# Patient Record
Sex: Male | Born: 1985
Health system: Western US, Academic
[De-identification: ages and names within clinical notes are randomized; demographics above are authoritative.]

## PROBLEM LIST (undated history)

## (undated) ENCOUNTER — Ambulatory Visit: Payer: Self-pay

## (undated) ENCOUNTER — Ambulatory Visit (HOSPITAL_COMMUNITY): Admission: EM | Disposition: A | Discharge status: Home / Self Care | Payer: Self-pay

## (undated) DIAGNOSIS — Z21 Asymptomatic human immunodeficiency virus [HIV] infection status: Secondary | ICD-10-CM

## (undated) DIAGNOSIS — G43909 Migraine, unspecified, not intractable, without status migrainosus: Secondary | ICD-10-CM

## (undated) DIAGNOSIS — Z79890 Hormone replacement therapy (postmenopausal): Secondary | ICD-10-CM

## (undated) DIAGNOSIS — F640 Transsexualism: Secondary | ICD-10-CM

## (undated) HISTORY — PX: RHINOPLASTY: SUR1284

## (undated) HISTORY — PX: COSMETIC SURGERY: SHX468

---
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
## 2003-10-26 ENCOUNTER — Encounter: Admission: RE | Admit: 2003-10-26 | Discharge: 2003-10-26 | Payer: Self-pay | Admitting: Family Medicine

## 2003-11-05 ENCOUNTER — Ambulatory Visit (HOSPITAL_COMMUNITY): Admission: RE | Admit: 2003-11-05 | Discharge: 2003-11-05 | Payer: Self-pay | Admitting: *Deleted

## 2003-11-05 ENCOUNTER — Ambulatory Visit (HOSPITAL_BASED_OUTPATIENT_CLINIC_OR_DEPARTMENT_OTHER): Admission: RE | Admit: 2003-11-05 | Discharge: 2003-11-05 | Payer: Self-pay | Admitting: *Deleted

## 2005-03-18 ENCOUNTER — Emergency Department (HOSPITAL_COMMUNITY): Admission: EM | Admit: 2005-03-18 | Discharge: 2005-03-18 | Payer: Self-pay | Admitting: Emergency Medicine

## 2005-11-04 ENCOUNTER — Emergency Department (HOSPITAL_COMMUNITY): Admission: EM | Admit: 2005-11-04 | Discharge: 2005-11-04 | Payer: Self-pay | Admitting: Emergency Medicine

## 2006-11-27 ENCOUNTER — Emergency Department (HOSPITAL_COMMUNITY): Admission: EM | Admit: 2006-11-27 | Discharge: 2006-11-27 | Payer: Self-pay | Admitting: Emergency Medicine

## 2006-12-02 ENCOUNTER — Emergency Department (HOSPITAL_COMMUNITY): Admission: EM | Admit: 2006-12-02 | Discharge: 2006-12-02 | Payer: Self-pay | Admitting: Family Medicine

## 2007-12-16 ENCOUNTER — Emergency Department (HOSPITAL_COMMUNITY): Admission: EM | Admit: 2007-12-16 | Discharge: 2007-12-16 | Payer: Self-pay | Admitting: Emergency Medicine

## 2008-02-20 ENCOUNTER — Emergency Department (HOSPITAL_COMMUNITY): Admission: EM | Admit: 2008-02-20 | Discharge: 2008-02-20 | Payer: Self-pay | Admitting: Emergency Medicine

## 2008-02-22 ENCOUNTER — Emergency Department (HOSPITAL_COMMUNITY): Admission: EM | Admit: 2008-02-22 | Discharge: 2008-02-22 | Payer: Self-pay | Admitting: Emergency Medicine

## 2008-02-24 ENCOUNTER — Emergency Department (HOSPITAL_COMMUNITY): Admission: EM | Admit: 2008-02-24 | Discharge: 2008-02-24 | Payer: Self-pay | Admitting: Family Medicine

## 2009-11-16 ENCOUNTER — Emergency Department (HOSPITAL_COMMUNITY): Admission: EM | Admit: 2009-11-16 | Discharge: 2009-11-16 | Payer: Self-pay | Admitting: Emergency Medicine

## 2009-11-18 ENCOUNTER — Emergency Department (HOSPITAL_COMMUNITY): Admission: EM | Admit: 2009-11-18 | Discharge: 2009-11-18 | Payer: Self-pay | Admitting: Emergency Medicine

## 2009-11-22 ENCOUNTER — Emergency Department (HOSPITAL_COMMUNITY): Admission: EM | Admit: 2009-11-22 | Discharge: 2009-11-22 | Payer: Self-pay | Admitting: Emergency Medicine

## 2010-03-26 ENCOUNTER — Emergency Department (HOSPITAL_COMMUNITY)
Admission: EM | Admit: 2010-03-26 | Discharge: 2010-03-26 | Payer: Self-pay | Source: Home / Self Care | Admitting: Emergency Medicine

## 2010-04-06 ENCOUNTER — Emergency Department (HOSPITAL_COMMUNITY)
Admission: EM | Admit: 2010-04-06 | Discharge: 2010-04-06 | Payer: Self-pay | Source: Home / Self Care | Admitting: Emergency Medicine

## 2010-04-25 ENCOUNTER — Emergency Department (HOSPITAL_COMMUNITY)
Admission: EM | Admit: 2010-04-25 | Discharge: 2010-04-25 | Disposition: A | Discharge status: Home / Self Care | Payer: Self-pay | Attending: Emergency Medicine | Admitting: Emergency Medicine

## 2010-04-25 DIAGNOSIS — R21 Rash and other nonspecific skin eruption: Secondary | ICD-10-CM | POA: Insufficient documentation

## 2010-04-25 DIAGNOSIS — B86 Scabies: Secondary | ICD-10-CM | POA: Insufficient documentation

## 2010-05-29 LAB — URINALYSIS, ROUTINE W REFLEX MICROSCOPIC
Glucose, UA: NEGATIVE mg/dL
Ketones, ur: NEGATIVE mg/dL
Nitrite: NEGATIVE
Protein, ur: NEGATIVE mg/dL
Urobilinogen, UA: 0.2 mg/dL (ref 0.0–1.0)

## 2010-05-29 LAB — URINE MICROSCOPIC-ADD ON

## 2010-08-01 NOTE — Op Note (Signed)
NAME:  Frederick Hess, Frederick Hess                      ACCOUNT NO.:  [id number]   MEDICAL RECORD NO.:  [id number]                   PATIENT TYPE:  AMB   LOCATION:  DSC                                  FACILITY:  [facility]   PHYSICIAN:  Lowell Bouton, M.D.      DATE OF BIRTH:  02/21/1986   DATE OF PROCEDURE:  11/05/2003  DATE OF DISCHARGE:                                 OPERATIVE REPORT   PREOPERATIVE DIAGNOSIS:  Infected wound, right wrist and thumb.   POSTOPERATIVE DIAGNOSIS:  Partial radial artery laceration with large  hematoma, right wrist and thumb.   OPERATION/PROCEDURE:  Irrigation and debridement, right wrist and thumb with  attempted repair of longitudinal tear of dorsal branch of the radial artery.   SURGEON:  Lowell Bouton, M.D.   ANESTHESIA:  General.   OPERATIVE FINDINGS:  The patient had a large hematoma in the wound without  any gross purulence.  The dorsal branch of the radial artery had been  longitudinally cut.  The tissue was so friable as the injury was two weeks  old that on attempted repair of the artery, the sutures tore through the  vessel wall.  The artery was ultimately tied off.   DESCRIPTION OF PROCEDURE:  Under general anesthesia with a tourniquet on the  right arm, the right hand was prepped and draped in the usual fashion.  After elevating the limb, the tourniquet was inflated to 250 mmHg.  The old  sutures were removed and the large hematoma was evacuated.  There was  significant bleeding from the base of the wound.  It was noted that the  dorsal branch of the radial artery was longitudinally cut.  At this point  the microscope was brought in and with micro instruments, an 8-0 nylon was  used to try to repair the cut in the artery.  The tissue was very friable  and the suture material would pull out of the vessel.  After attempting to  repair completely with the 8-0 nylon suture, the tourniquet was released and  there was significant  bleeding.  It was noted that the sutures could not  hold the vessel together with the amount of force of the blood.  The  tourniquet was then reinflated and the artery was dissected out under loop  magnification.  It was then tied off proximally and distally with a 4-0  chromic suture.  The tourniquet was again released and the bleeding was  controlled.  There was good circulation of the hand.  The wound was  irrigated copiously with saline. Cultures were obtained and sent to the lab.  A vessel loop drain was left in for drainage and the central part of the  wound was packed open.  The skin edges were debrided of the rough edges and  were closed with 4-0 nylon sutures.  Sterile dressings were applied followed  by  thumb spica splint.  The patient tolerated the procedure well and went to  the recovery room awake and in stable and good condition.   He will be observed overnight for any further bleeding or increased pain.                                               Lowell Bouton, M.D.    EMM/MEDQ  D:  11/05/2003  T:  11/05/2003  Job:  [id number]   cc:   Bryan Lemma. Manus Gunning, M.D.  [address] [place]  [place] [address]  Fax: [phone]

## 2010-08-29 ENCOUNTER — Emergency Department (HOSPITAL_COMMUNITY)
Admission: EM | Admit: 2010-08-29 | Discharge: 2010-08-29 | Disposition: A | Discharge status: Home / Self Care | Payer: Self-pay | Attending: Emergency Medicine | Admitting: Emergency Medicine

## 2010-08-29 DIAGNOSIS — Z202 Contact with and (suspected) exposure to infections with a predominantly sexual mode of transmission: Secondary | ICD-10-CM | POA: Insufficient documentation

## 2010-08-29 DIAGNOSIS — L290 Pruritus ani: Secondary | ICD-10-CM | POA: Insufficient documentation

## 2010-12-19 LAB — COMPREHENSIVE METABOLIC PANEL
ALT: 20 U/L (ref 0–53)
Alkaline Phosphatase: 43 U/L (ref 39–117)
BUN: 4 mg/dL — ABNORMAL LOW (ref 6–23)
CO2: 26 mEq/L (ref 19–32)
Calcium: 8.9 mg/dL (ref 8.4–10.5)
GFR calc non Af Amer: >60 mL/min (ref >60)
Glucose, Bld: 124 mg/dL — ABNORMAL HIGH (ref 70–99)
Sodium: 138 mEq/L (ref 135–145)

## 2010-12-19 LAB — CBC
HCT: 42.3 % (ref 39.0–52.0)
Hemoglobin: 14.2 g/dL (ref 13.0–17.0)
MCHC: 33.6 g/dL (ref 30.0–36.0)
MCV: 91.4 fL (ref 78.0–100.0)
RBC: 4.63 MIL/uL (ref 4.22–5.81)
RDW: 13.8 % (ref 11.5–15.5)

## 2010-12-19 LAB — DIFFERENTIAL
Basophils Relative: 0 % (ref 0–1)
Eosinophils Absolute: 0.1 10*3/uL (ref 0.0–0.7)
Lymphs Abs: 1 10*3/uL (ref 0.7–4.0)
Neutro Abs: 6.7 10*3/uL (ref 1.7–7.7)
Neutrophils Relative %: 79 % — ABNORMAL HIGH (ref 43–77)

## 2010-12-19 LAB — URINALYSIS, ROUTINE W REFLEX MICROSCOPIC
Bilirubin Urine: NEGATIVE
Glucose, UA: NEGATIVE mg/dL
Hgb urine dipstick: NEGATIVE
Ketones, ur: NEGATIVE mg/dL
Nitrite: NEGATIVE
Specific Gravity, Urine: 1.018 (ref 1.005–1.030)
pH: 5.5 (ref 5.0–8.0)

## 2010-12-19 LAB — LIPASE, BLOOD: Lipase: 12 U/L (ref 11–59)

## 2011-09-20 ENCOUNTER — Emergency Department (HOSPITAL_COMMUNITY)
Admission: EM | Admit: 2011-09-20 | Discharge: 2011-09-20 | Disposition: A | Discharge status: Home / Self Care | Payer: Self-pay | Attending: Emergency Medicine | Admitting: Emergency Medicine

## 2011-09-20 ENCOUNTER — Encounter (HOSPITAL_COMMUNITY): Payer: Self-pay | Admitting: Nurse Practitioner

## 2011-09-20 ENCOUNTER — Emergency Department (HOSPITAL_COMMUNITY): Payer: Self-pay

## 2011-09-20 DIAGNOSIS — N451 Epididymitis: Secondary | ICD-10-CM

## 2011-09-20 DIAGNOSIS — N453 Epididymo-orchitis: Secondary | ICD-10-CM | POA: Insufficient documentation

## 2011-09-20 LAB — URINALYSIS, ROUTINE W REFLEX MICROSCOPIC
Ketones, ur: NEGATIVE mg/dL
Leukocytes, UA: NEGATIVE
Nitrite: NEGATIVE
Protein, ur: NEGATIVE mg/dL

## 2011-09-20 LAB — POCT I-STAT, CHEM 8
Calcium, Ion: 1.2 mmol/L (ref 1.12–1.23)
Creatinine, Ser: 1.1 mg/dL (ref 0.50–1.35)
Glucose, Bld: 77 mg/dL (ref 70–99)
Hemoglobin: 11.6 g/dL — ABNORMAL LOW (ref 13.0–17.0)
TCO2: 23 mmol/L (ref 0–100)

## 2011-09-20 MED ORDER — CIPROFLOXACIN HCL 500 MG PO TABS: 500.0000 mg | ORAL_TABLET | Freq: Two times a day (BID) | ORAL | Status: AC

## 2011-09-20 MED ORDER — OXYCODONE-ACETAMINOPHEN 5-325 MG PO TABS: 1.0000 | ORAL_TABLET | Freq: Four times a day (QID) | ORAL | Status: AC | PRN

## 2011-09-20 NOTE — ED Notes (Signed)
Pt reports she is taking hormone replacement and is concerned this could affect her kidneys.

## 2011-09-20 NOTE — ED Notes (Signed)
Patient transported to Ultrasound by [facility] EMT 

## 2011-09-20 NOTE — ED Notes (Signed)
Patient is resting comfortably. Pt returned from [place] w/no complaints

## 2011-09-20 NOTE — ED Provider Notes (Signed)
History     CSN: [id number]  Arrival date & time 09/20/11  1101   First MD Initiated Contact with Patient 09/20/11 1124      Chief Complaint  Patient presents with  . Pelvic Pain    (Consider location/radiation/quality/duration/timing/severity/associated sxs/prior treatment) HPI Comments: Patient is a "trans-sexual".  Presents complaining of pain in his right testicle since having intercourse 3 days ago and his penis "bent".  He feels as though his right testicle is swollen.  No difficulty with urination.    Patient is a 26 y.o. male presenting with pelvic pain. The history is provided by the patient.  Pelvic Pain This is a new problem. Episode onset: 3 days ago. The problem occurs constantly. The problem has not changed since onset.Pertinent negatives include no abdominal pain. Exacerbated by: erections. Nothing relieves the symptoms. He has tried a cold compress (nsaids) for the symptoms. The treatment provided no relief.    History reviewed. No pertinent past medical history.  History reviewed. No pertinent past surgical history.  History reviewed. No pertinent family history.  History  Substance Use Topics  . Smoking status: Never Smoker   . Smokeless tobacco: Not on file  . Alcohol Use: No      Review of Systems  Gastrointestinal: Negative for abdominal pain.  Genitourinary: Positive for pelvic pain.  All other systems reviewed and are negative.    Allergies  Amoxicillin  Home Medications   Current Outpatient Rx  Name Route Sig Dispense Refill  . ESTROGENS CONJUGATED 1.25 MG PO TABS Oral Take 1.25 mg by mouth daily.    Marland Kitchen MEDROXYPROGESTERONE ACETATE 10 MG PO TABS Oral Take 10 mg by mouth daily.    Marland Kitchen SPIRONOLACTONE 100 MG PO TABS Oral Take 100 mg by mouth daily.      BP 122/71  Pulse 68  Temp 98.2 F (36.8 C) (Oral)  Resp 20  Ht 6\' 1"  (1.854 m)  Wt 185 lb (83.915 kg)  BMI 24.41 kg/m2  SpO2 100%  Physical Exam  Nursing note and vitals  reviewed. Constitutional: He is oriented to person, place, and time. He appears well-developed and well-nourished. No distress.  HENT:  Head: Normocephalic and atraumatic.  Neck: Normal range of motion. Neck supple.  Genitourinary:       The GU exam was witnessed by Shon Hale RN.    There is ttp of the right scrotum and testicle with mild swelling.  The penis appears normal without swelling or deformity.    Neurological: He is alert and oriented to person, place, and time.  Skin: Skin is warm and dry. He is not diaphoretic.    ED Course  Procedures (including critical care time)   Labs Reviewed  URINALYSIS, ROUTINE W REFLEX MICROSCOPIC  GC/CHLAMYDIA PROBE AMP, URINE   No results found.   No diagnosis found.    MDM  The US of the testes shows epididymitis and a hematoma vs abscess in the perineum.  With the history given, I suspect this is a hematoma.  Will treat with antibx for the epididymitis, to return if worsens.        Geoffery Lyons, MD 09/20/11 [phone]

## 2011-09-20 NOTE — ED Notes (Signed)
Pt c/o pain in/around testicles since having intercourse 3 days ago. Tried ice and ibuprofen at home with no relief. Denies urinary changes.

## 2011-09-20 NOTE — ED Notes (Signed)
MD at bedside. Dr. Judd Lien at bedside, Marylene Land RN to chaperone

## 2011-09-20 NOTE — ED Notes (Signed)
Family at bedside. Pt still in [place], family at bedside waiting for pt to return

## 2011-09-20 NOTE — ED Notes (Signed)
Pt reports he is a transgender and prefers to be addressed as Mrs. Kinoshita. Pt reports she was having sexual intercourse 3 days ago, pt states "I missed and hit partners buttocks, penis went soft immediately." Pt reports increased lower abd pain, scrotum pain, and (R) testicle pain since injury. Pt denies any pain or burning w/urination, but reports "horrible excruciating" pain w/an erection. Pt denies any sexual intercourse since injury, pt has been applying ice to area and taking Ibuprofen w/no relief.

## 2012-01-05 ENCOUNTER — Encounter (HOSPITAL_COMMUNITY): Payer: Self-pay | Admitting: *Deleted

## 2012-01-05 ENCOUNTER — Emergency Department (HOSPITAL_COMMUNITY)
Admission: EM | Admit: 2012-01-05 | Discharge: 2012-01-05 | Disposition: A | Discharge status: Home / Self Care | Payer: Self-pay | Attending: Emergency Medicine | Admitting: Emergency Medicine

## 2012-01-05 DIAGNOSIS — R300 Dysuria: Secondary | ICD-10-CM | POA: Insufficient documentation

## 2012-01-05 DIAGNOSIS — Z79899 Other long term (current) drug therapy: Secondary | ICD-10-CM | POA: Insufficient documentation

## 2012-01-05 DIAGNOSIS — R319 Hematuria, unspecified: Secondary | ICD-10-CM | POA: Insufficient documentation

## 2012-01-05 DIAGNOSIS — R21 Rash and other nonspecific skin eruption: Secondary | ICD-10-CM | POA: Insufficient documentation

## 2012-01-05 LAB — CBC
HCT: 35.6 % — ABNORMAL LOW (ref 39.0–52.0)
MCV: 85.2 fL (ref 78.0–100.0)
RBC: 4.18 MIL/uL — ABNORMAL LOW (ref 4.22–5.81)
WBC: 7.4 10*3/uL (ref 4.0–10.5)

## 2012-01-05 LAB — URINALYSIS, ROUTINE W REFLEX MICROSCOPIC
Bilirubin Urine: NEGATIVE
Glucose, UA: NEGATIVE mg/dL
Hgb urine dipstick: NEGATIVE
Ketones, ur: NEGATIVE mg/dL
pH: 8 (ref 5.0–8.0)

## 2012-01-05 MED ORDER — HYDROCORTISONE 1 % EX CREA: TOPICAL_CREAM | CUTANEOUS | Status: DC

## 2012-01-05 MED ORDER — DIPHENHYDRAMINE HCL 25 MG PO TABS: 25.0000 mg | ORAL_TABLET | Freq: Four times a day (QID) | ORAL | Status: DC | PRN

## 2012-01-05 MED ORDER — DIPHENHYDRAMINE-ZINC ACETATE 1-0.1 % EX CREA: TOPICAL_CREAM | Freq: Three times a day (TID) | CUTANEOUS | Status: DC | PRN

## 2012-01-05 NOTE — ED Provider Notes (Signed)
History  Scribed for [employer]. [place] Zou, MD, the patient was seen in room TR04C/TR04C. This chart was scribed by Candelaria Stagers. The patient's care started at 3:29 PM   CSN: [id number]  Arrival date & time 01/05/12  1447   First MD Initiated Contact with Patient 01/05/12 1526      Chief Complaint  Patient presents with  . Hematuria     The history is provided by the patient. No language interpreter was used.   Frederick Hess is a 26 y.o. male who presents to the Emergency Department complaining of hematuria that started yesterday.  Pt is also experiencing dysuria.  Nothing seems to make the sx better or worse. History reviewed. No pertinent past medical history.  History reviewed. No pertinent past surgical history.  No family history on file.  History  Substance Use Topics  . Smoking status: Never Smoker   . Smokeless tobacco: Not on file  . Alcohol Use: No      Review of Systems  Genitourinary: Positive for hematuria.  All other systems reviewed and are negative.    Allergies  Amoxicillin  Home Medications   Current Outpatient Rx  Name Route Sig Dispense Refill  . ESTRADIOL VALERATE 40 MG/ML IM OIL Intramuscular Inject 40 mg into the muscle every 14 (fourteen) days.     . ESTROGENS CONJUGATED 1.25 MG PO TABS Oral Take 1.25 mg by mouth daily.    Marland Kitchen SPIRONOLACTONE 100 MG PO TABS Oral Take 100 mg by mouth daily.      BP 138/87  Pulse 73  Temp 98.3 F (36.8 C) (Oral)  Resp 20  SpO2 99%  Physical Exam  Nursing note and vitals reviewed. Constitutional: He is oriented to person, place, and time. He appears well-developed and well-nourished. No distress.  HENT:  Head: Normocephalic and atraumatic.  Eyes: EOM are normal. Pupils are equal, round, and reactive to light.  Neck: Neck supple. No tracheal deviation present.  Pulmonary/Chest: Effort normal. No respiratory distress.  Musculoskeletal: Normal range of motion. He exhibits no edema.    Neurological: He is alert and oriented to person, place, and time. No sensory deficit.  Skin: Skin is warm and dry.  Psychiatric: He has a normal mood and affect. His behavior is normal.    ED Course  Procedures   DIAGNOSTIC STUDIES:  COORDINATION OF CARE:  15:20 Ordered: Urinalysis, Routine w reflex microscopic   Labs Reviewed  URINALYSIS, ROUTINE W REFLEX MICROSCOPIC - Abnormal; Notable for the following:    APPearance HAZY (*)     Leukocytes, UA TRACE (*)     All other components within normal limits  URINE MICROSCOPIC-ADD ON - Abnormal; Notable for the following:    Squamous Epithelial / LPF FEW (*)     All other components within normal limits  CBC - Abnormal; Notable for the following:    RBC 4.18 (*)     Hemoglobin 12.8 (*)     HCT 35.6 (*)     Platelets 140 (*)     All other components within normal limits  RAPID HIV SCREEN ([facility])  GC/CHLAMYDIA PROBE AMP, GENITAL  RPR   No results found.   1. Dysuria   2. Hematuria       MDM  Patient was seen earlier today for rash. She states she that she got home and had blood in her urine. She's had some dysuria since yesterday. She is a transgender male to male. Urinalysis does not show hematuria or infection. Hemoglobin  is good and platelets are 140. GC chlamydia swab was done and patient will followup with urology as needed.  I personally performed the services described in this documentation, which was scribed in my presence. The recorded information has been reviewed and considered.         Juliet Rude. Rubin Payor, MD 01/05/12 2014

## 2012-01-05 NOTE — ED Provider Notes (Signed)
History  Scribed for [employer]. Sheridan Gettel, MD, the patient was seen in room TR05C/TR05C. This chart was scribed by Candelaria Stagers. The patient's care started at 11:46 AM   CSN: [id number]  Arrival date & time 01/05/12  1106   First MD Initiated Contact with Patient 01/05/12 1143      Chief Complaint  Patient presents with  . Rash    The history is provided by the patient. No language interpreter was used.   Frederick Hess is a 26 y.o. male who presents to the Emergency Department complaining of an itching rash to the abdomen and left arm that started several days ago.  Pt reports that the rash started on the abdomen and is now on the left forearm.  She reports that her roommate has the same rash.  She denies any new pets, new detergents, or new soaps.  Nothing seems to make the sx better or worse.    History reviewed. No pertinent past medical history.  History reviewed. No pertinent past surgical history.  No family history on file.  History  Substance Use Topics  . Smoking status: Never Smoker   . Smokeless tobacco: Not on file  . Alcohol Use: No      Review of Systems  Skin: Positive for rash (itching rash to left forearm).  All other systems reviewed and are negative.    Allergies  Amoxicillin  Home Medications   Current Outpatient Rx  Name Route Sig Dispense Refill  . DIPHENHYDRAMINE HCL 25 MG PO TABS Oral Take 1 tablet (25 mg total) by mouth every 6 (six) hours as needed for itching. 20 tablet 0  . DIPHENHYDRAMINE-ZINC ACETATE 1-0.1 % EX CREA Topical Apply topically 3 (three) times daily as needed for itching. 28.3 g 0  . ESTROGENS CONJUGATED 1.25 MG PO TABS Oral Take 1.25 mg by mouth daily.    Marland Kitchen HYDROCORTISONE 1 % EX CREA  Apply to affected area 2 times daily 15 g 0  . MEDROXYPROGESTERONE ACETATE 10 MG PO TABS Oral Take 10 mg by mouth daily.    Marland Kitchen SPIRONOLACTONE 100 MG PO TABS Oral Take 100 mg by mouth daily.      BP 128/78  Pulse 56  Temp 98.1 F  (36.7 C) (Oral)  Resp 16  SpO2 100%  Physical Exam  Nursing note and vitals reviewed. Constitutional: He is oriented to person, place, and time. He appears well-developed and well-nourished. No distress.  HENT:  Head: Normocephalic and atraumatic.  Eyes: EOM are normal. Pupils are equal, round, and reactive to light.  Neck: Neck supple. No tracheal deviation present.  Pulmonary/Chest: Effort normal. No respiratory distress.  Musculoskeletal: Normal range of motion. He exhibits no edema.  Neurological: He is alert and oriented to person, place, and time. No sensory deficit.  Skin: Skin is warm and dry.       2-3 small slightly raised, slightly erythemas papules.  No fluctuants, no pustules, no induration.  No visible area under the right breast as stated by the pt.    Psychiatric: He has a normal mood and affect. His behavior is normal.    ED Course  Procedures   11:48 AM  Discussed possible bugs bites.  Will prescribe benadryl.   Labs Reviewed - No data to display No results found.   1. Rash       MDM  Patient with small rash to forearm. Maybe insect bite. Does not appear to be scabies. D/c with symptomatic treatment  I  personally performed the services described in this documentation, which was scribed in my presence. The recorded information has been reviewed and considered.         Juliet Rude. Rubin Payor, MD 01/05/12 1200

## 2012-01-05 NOTE — ED Notes (Signed)
Pt is here with pain with urination and blood started coming out with urine today

## 2012-01-05 NOTE — Discharge Instructions (Signed)
Dysuria Dysuria is the medical term for pain with urination. There are many causes for dysuria, but urinary tract infection is the most common. If a urinalysis was performed it can show that there is a urinary tract infection. A urine culture confirms that you or your child is sick. You will need to follow up with a healthcare provider because:  If a urine culture was done you will need to know the culture results and treatment recommendations.  If the urine culture was positive, you or your child will need to be put on antibiotics or know if the antibiotics prescribed are the right antibiotics for your urinary tract infection.  If the urine culture is negative (no urinary tract infection), then other causes may need to be explored or antibiotics need to be stopped. Today laboratory work may have been done and there does not seem to be an infection. If cultures were done they will take at least 24 to 48 hours to be completed. Today x-rays may have been taken and they read as normal. No cause can be found for the problems. The x-rays may be re-read by a radiologist and you will be contacted if additional findings are made. You or your child may have been put on medications to help with this problem until you can see your primary caregiver. If the problems get better, see your primary caregiver if the problems return. If you were given antibiotics (medications which kill germs), take all of the mediations as directed for the full course of treatment.  If laboratory work was done, you need to find the results. Leave a telephone number where you can be reached. If this is not possible, make sure you find out how you are to get test results. HOME CARE INSTRUCTIONS   Drink lots of fluids. For adults, drink eight, 8 ounce glasses of clear juice or water a day. For children, replace fluids as suggested by your caregiver.  Empty the bladder often. Avoid holding urine for long periods of time.  After a bowel  movement, women should cleanse front to back, using each tissue only once.  Empty your bladder before and after sexual intercourse.  Take all the medicine given to you until it is gone. You may feel better in a few days, but TAKE ALL MEDICINE.  Avoid caffeine, tea, alcohol and carbonated beverages, because they tend to irritate the bladder.  In men, alcohol may irritate the prostate.  Only take over-the-counter or prescription medicines for pain, discomfort, or fever as directed by your caregiver.  If your caregiver has given you a follow-up appointment, it is very important to keep that appointment. Not keeping the appointment could result in a chronic or permanent injury, pain, and disability. If there is any problem keeping the appointment, you must call back to this facility for assistance. SEEK IMMEDIATE MEDICAL CARE IF:   Back pain develops.  A fever develops.  There is nausea (feeling sick to your stomach) or vomiting (throwing up).  Problems are no better with medications or are getting worse. MAKE SURE YOU:   Understand these instructions.  Will watch your condition.  Will get help right away if you are not doing well or get worse. Document Released: 11/29/2003 Document Revised: 05/25/2011 Document Reviewed: 10/06/2007 [facility] Patient Information 2013 [place], [place]. Hematuria, Adult Hematuria (blood in your urine) can be caused by a bladder infection (cystitis), kidney infection (pyelonephritis), prostate infection (prostatitis), or kidney stone. Infections will usually respond to antibiotics (medications which kill  germs), and a kidney stone will usually pass through your urine without further treatment. If you were put on antibiotics, take all the medicine until gone. You may feel better in a few days, but take all of your medicine or the infection may not respond and become more difficult to treat. If antibiotics were not given, an infection did not cause the blood in  the urine. A further work up to find out the reason may be needed. HOME CARE INSTRUCTIONS   Drink lots of fluid, 3 to 4 quarts a day. If you have been diagnosed with an infection, cranberry juice is especially recommended, in addition to large amounts of water.  Avoid caffeine, tea, and carbonated beverages, because they tend to irritate the bladder.  Avoid alcohol as it may irritate the prostate.  Only take over-the-counter or prescription medicines for pain, discomfort, or fever as directed by your caregiver.  If you have been diagnosed with a kidney stone follow your caregivers instructions regarding straining your urine to catch the stone. TO PREVENT FURTHER INFECTIONS:  Empty the bladder often. Avoid holding urine for long periods of time.  After a bowel movement, women should cleanse front to back. Use each tissue only once.  Empty the bladder before and after sexual intercourse if you are a male.  Return to your caregiver if you develop back pain, fever, nausea (feeling sick to your stomach), vomiting, or your symptoms (problems) are not better in 3 days. Return sooner if you are getting worse. If you have been requested to return for further testing make sure to keep your appointments. If an infection is not the cause of blood in your urine, X-rays may be required. Your caregiver will discuss this with you. SEEK IMMEDIATE MEDICAL CARE IF:   You have a persistent fever over 102 F (38.9 C).  You develop severe vomiting and are unable to keep the medication down.  You develop severe back or abdominal pain despite taking your medications.  You begin passing a large amount of blood or clots in your urine.  You feel extremely weak or faint, or pass out. MAKE SURE YOU:   Understand these instructions.  Will watch your condition.  Will get help right away if you are not doing well or get worse. Document Released: 03/02/2005 Document Revised: 05/25/2011 Document Reviewed:  10/20/2007 [facility] Patient Information 2013 [place], [place].

## 2012-01-05 NOTE — ED Notes (Signed)
Pt report received from Sharyon Cable, Charity fundraiser. Introduced self to pt. Pt states blood in urine and burning on urination.

## 2012-01-05 NOTE — ED Notes (Signed)
Pt is here with rash to left upper arm and abdomen that itches. NAD

## 2012-01-05 NOTE — ED Notes (Signed)
Pt ambulatory leaving ED with friend. Pt does not appear to be in any acute distress. Pt left with discharge instructions. Pt had no further questions upon discharge.

## 2012-01-06 ENCOUNTER — Emergency Department (HOSPITAL_COMMUNITY)
Admission: EM | Admit: 2012-01-06 | Discharge: 2012-01-06 | Disposition: A | Discharge status: Home / Self Care | Payer: Self-pay | Attending: Emergency Medicine | Admitting: Emergency Medicine

## 2012-01-06 ENCOUNTER — Encounter (HOSPITAL_COMMUNITY): Payer: Self-pay | Admitting: Emergency Medicine

## 2012-01-06 DIAGNOSIS — R339 Retention of urine, unspecified: Secondary | ICD-10-CM | POA: Insufficient documentation

## 2012-01-06 DIAGNOSIS — Z79899 Other long term (current) drug therapy: Secondary | ICD-10-CM | POA: Insufficient documentation

## 2012-01-06 DIAGNOSIS — R300 Dysuria: Secondary | ICD-10-CM | POA: Insufficient documentation

## 2012-01-06 DIAGNOSIS — R110 Nausea: Secondary | ICD-10-CM | POA: Insufficient documentation

## 2012-01-06 LAB — URINALYSIS, ROUTINE W REFLEX MICROSCOPIC
Glucose, UA: NEGATIVE mg/dL
Nitrite: NEGATIVE
Protein, ur: NEGATIVE mg/dL
pH: 6 (ref 5.0–8.0)

## 2012-01-06 LAB — URINE MICROSCOPIC-ADD ON

## 2012-01-06 MED ORDER — PROMETHAZINE HCL 25 MG PO TABS: 25.0000 mg | ORAL_TABLET | Freq: Four times a day (QID) | ORAL | Status: DC | PRN

## 2012-01-06 NOTE — ED Notes (Signed)
Pt ambulatory leaving ED with friend. Pt given discharge teaching and prescription. Pt does not appear to be in any acute distress upon discharge. Pt had no further questions at discharge.

## 2012-01-06 NOTE — ED Notes (Signed)
C. Schinlever, PA at bedside.  

## 2012-01-06 NOTE — ED Notes (Signed)
Pt returned to ED since being d/c last night. Pt states called the doctor to get results of HIV and STD screening and everything came back negative. Pt states told doctor current symptoms and said doctor told pt to return to ED to be checked. Pt state painful urination; blood in urine this morning, but has since stopped seeing blood in urine; tightness in abdomen.

## 2012-01-06 NOTE — ED Provider Notes (Signed)
History     CSN: [id number]  Arrival date & time 01/06/12  1931   First MD Initiated Contact with Patient 01/06/12 2003      Chief Complaint  Patient presents with  . Dysuria    (Consider location/radiation/quality/duration/timing/severity/associated sxs/prior treatment) HPI History provided by pt and prior chart.   Pt is trans-gender, M to F.  Presents w/ c/o dysuria, hematuria, urinary retention and lower abdominal pressure.  Presented w/ same yesterday, U/A and GC/Chlam neg, hgb and platelets w/in nml range, and pt d/c'd home w/ referral to urology.  Hematuria and dysuria have resolved but retention and pressure started this morning.  Feels like he is not completely emptying his bladder.  Associated w/ nausea.  Denies fever, genitalia rash, urethral discharge, rectal pain.   History reviewed. No pertinent past medical history.  History reviewed. No pertinent past surgical history.  No family history on file.  History  Substance Use Topics  . Smoking status: Never Smoker   . Smokeless tobacco: Not on file  . Alcohol Use: No      Review of Systems  All other systems reviewed and are negative.    Allergies  Amoxicillin  Home Medications   Current Outpatient Rx  Name Route Sig Dispense Refill  . ESTRADIOL VALERATE 40 MG/ML IM OIL Intramuscular Inject 40 mg into the muscle every 14 (fourteen) days.     . ESTROGENS CONJUGATED 1.25 MG PO TABS Oral Take 1.25 mg by mouth daily.    Marland Kitchen SPIRONOLACTONE 100 MG PO TABS Oral Take 100 mg by mouth daily.      BP 130/79  Pulse 62  Temp 98.4 F (36.9 C) (Oral)  Resp 18  SpO2 100%  Physical Exam  Nursing note and vitals reviewed. Constitutional: He is oriented to person, place, and time. He appears well-developed and well-nourished. No distress.  HENT:  Head: Normocephalic and atraumatic.  Eyes:       Normal appearance  Neck: Normal range of motion.  Cardiovascular: Normal rate and regular rhythm.   Pulmonary/Chest:  Effort normal and breath sounds normal. No respiratory distress.  Abdominal: Soft. Bowel sounds are normal. He exhibits no distension and no mass. There is no rebound and no guarding.       Mild suprapubic ttp  Genitourinary:       No CVA tenderness.  No genitalia rash.  No penile discharge.  Testicles descended bilaterally.  No masses.  No tenderness.  Prostate is not enlarged nor ttp.  Musculoskeletal: Normal range of motion.  Neurological: He is alert and oriented to person, place, and time.  Skin: Skin is warm and dry. No rash noted.  Psychiatric: He has a normal mood and affect. His behavior is normal.    ED Course  Procedures (including critical care time)  Labs Reviewed  URINALYSIS, ROUTINE W REFLEX MICROSCOPIC - Abnormal; Notable for the following:    APPearance HAZY (*)     Leukocytes, UA TRACE (*)     All other components within normal limits  URINE MICROSCOPIC-ADD ON - Abnormal; Notable for the following:    Squamous Epithelial / LPF FEW (*)     All other components within normal limits   No results found.   1. Dysuria       MDM  26yo Transgender M to F presents w/ urinary retention and lower abdominal pressure.  Seen for hematuria/dysuria yesterday, both of which have resolved, U/A and GC/Chlam as well as hgb and platelets w/in nml range.  On exam today, afebrile, mild suprapubic tenderness, nml genitalia and prostate. U/A negative and post-void bladder scan showed 13mL residual.  Pt d/c'd home w/ promethazine for nausea and referred again to urology.  Return precautions discussed.         Arie Sabina [place], [place] 01/06/12 2144

## 2012-01-06 NOTE — ED Notes (Signed)
Bladder scan performed approximately 13ml found in bladder

## 2012-01-06 NOTE — ED Notes (Signed)
C/o lower abd pain and difficulty urinating since last night.  Pt states he was seen in ED yesterday for hematuria.

## 2012-01-12 NOTE — ED Provider Notes (Signed)
Medical screening examination/treatment/procedure(s) were performed by non-physician practitioner and as supervising physician I was immediately available for consultation/collaboration.  Carmelia Tiner, MD 01/12/12 2305 

## 2012-04-26 ENCOUNTER — Emergency Department (HOSPITAL_COMMUNITY): Payer: Self-pay

## 2012-04-26 ENCOUNTER — Emergency Department (HOSPITAL_COMMUNITY)
Admission: EM | Admit: 2012-04-26 | Discharge: 2012-04-26 | Disposition: A | Discharge status: Home / Self Care | Payer: Self-pay | Attending: Emergency Medicine | Admitting: Emergency Medicine

## 2012-04-26 ENCOUNTER — Encounter (HOSPITAL_COMMUNITY): Payer: Self-pay | Admitting: Emergency Medicine

## 2012-04-26 ENCOUNTER — Other Ambulatory Visit: Payer: Self-pay

## 2012-04-26 DIAGNOSIS — R509 Fever, unspecified: Secondary | ICD-10-CM | POA: Insufficient documentation

## 2012-04-26 DIAGNOSIS — R059 Cough, unspecified: Secondary | ICD-10-CM | POA: Insufficient documentation

## 2012-04-26 DIAGNOSIS — F17200 Nicotine dependence, unspecified, uncomplicated: Secondary | ICD-10-CM | POA: Insufficient documentation

## 2012-04-26 DIAGNOSIS — R05 Cough: Secondary | ICD-10-CM | POA: Insufficient documentation

## 2012-04-26 DIAGNOSIS — Z79899 Other long term (current) drug therapy: Secondary | ICD-10-CM | POA: Insufficient documentation

## 2012-04-26 DIAGNOSIS — J3489 Other specified disorders of nose and nasal sinuses: Secondary | ICD-10-CM | POA: Insufficient documentation

## 2012-04-26 DIAGNOSIS — R1110 Vomiting, unspecified: Secondary | ICD-10-CM | POA: Insufficient documentation

## 2012-04-26 DIAGNOSIS — J069 Acute upper respiratory infection, unspecified: Secondary | ICD-10-CM | POA: Insufficient documentation

## 2012-04-26 LAB — CBC WITH DIFFERENTIAL/PLATELET
Basophils Relative: 0 % (ref 0–1)
Eosinophils Absolute: 0 10*3/uL (ref 0.0–0.7)
Eosinophils Relative: 0 % (ref 0–5)
Hemoglobin: 14.7 g/dL (ref 12.0–15.0)
MCH: 30.5 pg (ref 26.0–34.0)
MCHC: 35.9 g/dL (ref 30.0–36.0)
MCV: 85.1 fL (ref 78.0–100.0)
Monocytes Relative: 9 % (ref 3–12)
Neutrophils Relative %: 81 % — ABNORMAL HIGH (ref 43–77)

## 2012-04-26 LAB — COMPREHENSIVE METABOLIC PANEL
Albumin: 3.4 g/dL — ABNORMAL LOW (ref 3.5–5.2)
BUN: 4 mg/dL — ABNORMAL LOW (ref 6–23)
Calcium: 9 mg/dL (ref 8.4–10.5)
Creatinine, Ser: 1.19 mg/dL — ABNORMAL HIGH (ref 0.50–1.10)
GFR calc Af Amer: 71 mL/min — ABNORMAL LOW (ref >90)
Glucose, Bld: 138 mg/dL — ABNORMAL HIGH (ref 70–99)
Potassium: 3.2 mEq/L — ABNORMAL LOW (ref 3.5–5.1)
Total Protein: 7.5 g/dL (ref 6.0–8.3)

## 2012-04-26 MED ORDER — AZITHROMYCIN 1 G PO PACK
1.0000 g | PACK | Freq: Once | ORAL | Status: AC
  Administered 2012-04-26: 1 g via ORAL
  Filled 2012-04-26: qty 1

## 2012-04-26 MED ORDER — ACETAMINOPHEN 325 MG PO TABS
650.0000 mg | ORAL_TABLET | Freq: Once | ORAL | Status: AC
  Administered 2012-04-26: 650 mg via ORAL
  Filled 2012-04-26: qty 2

## 2012-04-26 MED ORDER — AZITHROMYCIN 250 MG PO TABS: 250.0000 mg | ORAL_TABLET | Freq: Every day | ORAL | Status: DC

## 2012-04-26 MED ORDER — ALBUTEROL SULFATE (5 MG/ML) 0.5% IN NEBU
2.5000 mg | INHALATION_SOLUTION | Freq: Once | RESPIRATORY_TRACT | Status: AC
  Administered 2012-04-26: 2.5 mg via RESPIRATORY_TRACT
  Filled 2012-04-26: qty 0.5

## 2012-04-26 MED ORDER — PREDNISONE 20 MG PO TABS
60.0000 mg | ORAL_TABLET | Freq: Once | ORAL | Status: AC
  Administered 2012-04-26: 60 mg via ORAL
  Filled 2012-04-26: qty 3

## 2012-04-26 MED ORDER — PREDNISONE 10 MG PO TABS: 50.0000 mg | ORAL_TABLET | Freq: Every day | ORAL | Status: DC

## 2012-04-26 NOTE — ED Notes (Signed)
PT IS AT XRAY.

## 2012-04-26 NOTE — ED Provider Notes (Signed)
History     CSN: [id number]  Arrival date & time 04/26/12  0006   First MD Initiated Contact with Patient 04/26/12 0020      Chief Complaint  Patient presents with  . Shortness of Breath  . Cough  . Nasal Congestion    (Consider location/radiation/quality/duration/timing/severity/associated sxs/prior treatment) Patient is a 27 y.o. male presenting with shortness of breath and cough. The history is provided by the patient.  Shortness of Breath Severity:  Moderate Onset quality:  Gradual Duration:  2 days Timing:  Constant Progression:  Worsening Chronicity:  New Relieved by:  Nothing Worsened by:  Nothing tried Associated symptoms: cough, fever and vomiting   Cough Associated symptoms: fever and shortness of breath     History reviewed. No pertinent past medical history.  History reviewed. No pertinent past surgical history.  History reviewed. No pertinent family history.  History  Substance Use Topics  . Smoking status: Current Every Day Smoker -- 1.00 packs/day  . Smokeless tobacco: Not on file  . Alcohol Use: Yes     Comment: Rarely    OB History   Grav Para Term Preterm Abortions TAB SAB Ect Mult Living                  Review of Systems  Constitutional: Positive for fever.  Respiratory: Positive for cough and shortness of breath.   Gastrointestinal: Positive for vomiting.  All other systems reviewed and are negative.    Allergies  Amoxicillin  Home Medications   Current Outpatient Rx  Name  Route  Sig  Dispense  Refill  . azithromycin (ZITHROMAX) 250 MG tablet   Oral   Take 1 tablet (250 mg total) by mouth daily. Take first 2 tablets together, then 1 every day until finished.   6 tablet   0   . estradiol valerate (DELESTROGEN) 40 MG/ML injection   Intramuscular   Inject 40 mg into the muscle every 14 (fourteen) days.          Marland Kitchen estrogens, conjugated, (PREMARIN) 1.25 MG tablet   Oral   Take 1.25 mg by mouth daily.         .  predniSONE (DELTASONE) 10 MG tablet   Oral   Take 5 tablets (50 mg total) by mouth daily.   20 tablet   0   . promethazine (PHENERGAN) 25 MG tablet   Oral   Take 1 tablet (25 mg total) by mouth every 6 (six) hours as needed for nausea.   20 tablet   0   . spironolactone (ALDACTONE) 100 MG tablet   Oral   Take 100 mg by mouth daily.           BP 110/63  Pulse 90  Temp(Src) 101.5 F (38.6 C) (Oral)  Resp 22  SpO2 100%  Physical Exam  Constitutional: She is oriented to person, place, and time. She appears well-developed and well-nourished.  HENT:  Head: Normocephalic and atraumatic.  Eyes: Conjunctivae and EOM are normal. Pupils are equal, round, and reactive to light.  Neck: Normal range of motion.  Cardiovascular: Normal rate, regular rhythm and normal heart sounds.   Pulmonary/Chest: Effort normal and breath sounds normal.  Abdominal: Soft. Bowel sounds are normal.  Musculoskeletal: Normal range of motion.  Neurological: She is alert and oriented to person, place, and time.  Skin: Skin is warm and dry.  Psychiatric: She has a normal mood and affect. Her behavior is normal.    ED Course  Procedures (  including critical care time)  Labs Reviewed  CBC WITH DIFFERENTIAL - Abnormal; Notable for the following:    Platelets 123 (*)    Neutrophils Relative 81 (*)    Lymphocytes Relative 10 (*)    All other components within normal limits  COMPREHENSIVE METABOLIC PANEL   Dg Chest 2 View  04/26/2012  *RADIOLOGY REPORT*  Clinical Data: SOB, congestion, cough , cold chills  CHEST - 2 VIEW  Comparison: 02/20/2008  Findings: Mild airway thickening is present centrally.  No airspace opacity. Cardiac and mediastinal contours appear unremarkable.  No pleural effusion noted.  IMPRESSION: 1.  Mild airway thickening may reflect bronchitis or reactive airways disease.   Otherwise, no significant abnormality identified.   Original Report Authenticated By: Gaylyn Rong, M.D.       1. Upper respiratory infection    04/26/2012  Date: 04/26/2012  Rate: 92  Rhythm: normal sinus rhythm  QRS Axis: normal  Intervals: normal  ST/T Wave abnormalities: normal  Conduction Disutrbances: none  Narrative Interpretation: unremarkable      MDM  + uri.  Fever.  Tol po.  Labs benign.  Will dc with abx, steriods,  Ret new/worening sxs        Alycia Cooperwood Lytle Michaels, MD 04/26/12 [phone]

## 2012-04-26 NOTE — ED Notes (Signed)
Patient complaining of chest congestion, productive cough (with green/yellow sputum production), nasal congestion, fever, and shortness of breath since Saturday.  Symptoms worsened today.

## 2012-12-26 ENCOUNTER — Emergency Department (HOSPITAL_COMMUNITY): Payer: Self-pay

## 2012-12-26 ENCOUNTER — Emergency Department (HOSPITAL_COMMUNITY)
Admission: EM | Admit: 2012-12-26 | Discharge: 2012-12-27 | Disposition: A | Discharge status: Home / Self Care | Payer: Self-pay | Attending: Emergency Medicine | Admitting: Emergency Medicine

## 2012-12-26 ENCOUNTER — Encounter (HOSPITAL_COMMUNITY): Payer: Self-pay | Admitting: Emergency Medicine

## 2012-12-26 DIAGNOSIS — M542 Cervicalgia: Secondary | ICD-10-CM | POA: Insufficient documentation

## 2012-12-26 DIAGNOSIS — M79609 Pain in unspecified limb: Secondary | ICD-10-CM | POA: Insufficient documentation

## 2012-12-26 DIAGNOSIS — Z87891 Personal history of nicotine dependence: Secondary | ICD-10-CM | POA: Insufficient documentation

## 2012-12-26 DIAGNOSIS — R079 Chest pain, unspecified: Secondary | ICD-10-CM | POA: Insufficient documentation

## 2012-12-26 DIAGNOSIS — Z79899 Other long term (current) drug therapy: Secondary | ICD-10-CM | POA: Insufficient documentation

## 2012-12-26 LAB — COMPREHENSIVE METABOLIC PANEL
ALT: 7 U/L (ref 0–53)
AST: 11 U/L (ref 0–37)
BUN: 10 mg/dL (ref 6–23)
CO2: 27 mEq/L (ref 19–32)
Calcium: 9.3 mg/dL (ref 8.4–10.5)
Chloride: 103 mEq/L (ref 96–112)
Creatinine, Ser: 1.13 mg/dL (ref 0.50–1.35)
GFR calc non Af Amer: 87 mL/min — ABNORMAL LOW (ref >90)
Total Bilirubin: 0.4 mg/dL (ref 0.3–1.2)
Total Protein: 7.2 g/dL (ref 6.0–8.3)

## 2012-12-26 LAB — TROPONIN I: Troponin I: <0.3 ng/mL (ref <0.30)

## 2012-12-26 LAB — CBC WITH DIFFERENTIAL/PLATELET
Basophils Absolute: 0 10*3/uL (ref 0.0–0.1)
Basophils Relative: 0 % (ref 0–1)
Eosinophils Relative: 1 % (ref 0–5)
HCT: 35.2 % — ABNORMAL LOW (ref 39.0–52.0)
Hemoglobin: 12.8 g/dL — ABNORMAL LOW (ref 13.0–17.0)
MCHC: 36.4 g/dL — ABNORMAL HIGH (ref 30.0–36.0)
MCV: 85.6 fL (ref 78.0–100.0)
Monocytes Absolute: 0.6 10*3/uL (ref 0.1–1.0)
Monocytes Relative: 8 % (ref 3–12)
Neutro Abs: 4 10*3/uL (ref 1.7–7.7)
Platelets: 150 10*3/uL (ref 150–400)
RBC: 4.11 MIL/uL — ABNORMAL LOW (ref 4.22–5.81)

## 2012-12-26 MED ORDER — POTASSIUM CHLORIDE CRYS ER 20 MEQ PO TBCR
40.0000 meq | EXTENDED_RELEASE_TABLET | Freq: Once | ORAL | Status: AC
  Administered 2012-12-26: 40 meq via ORAL
  Filled 2012-12-26: qty 2

## 2012-12-26 MED ORDER — IBUPROFEN 200 MG PO TABS
600.0000 mg | ORAL_TABLET | Freq: Once | ORAL | Status: AC
  Administered 2012-12-26: 600 mg via ORAL
  Filled 2012-12-26: qty 3

## 2012-12-26 NOTE — ED Provider Notes (Signed)
CSN: [id number]     Arrival date & time 12/26/12  2129 History   First MD Initiated Contact with Patient 12/26/12 2219     Chief Complaint  Patient presents with  . Chest Pain  . Arm Pain  . Neck Pain   (Consider location/radiation/quality/duration/timing/severity/associated sxs/prior Treatment) HPI Pt c/o mid to left cp that occasionally shoots down left arm for a few seconds. Cp present x 2-3 months. At rest. No relation to activity or exertion. No acute or abrupt change in pain tonight. No associated sob, nv or diaphoresis. No unusual fatigue or doe. Pain is not pleuritic. No leg pain or swelling. No immobility, trauma, surgery, or hx dvt or pe. No family hx premature cad. Former smoker. No cocaine use. Denies chest wall injury or strain. No cough or uri c/o. No fever or chills.    History reviewed. No pertinent past medical history. Past Surgical History  Procedure Laterality Date  . Cosmetic surgery     History reviewed. No pertinent family history. History  Substance Use Topics  . Smoking status: Former Smoker -- 1.00 packs/day  . Smokeless tobacco: Not on file  . Alcohol Use: Yes     Comment: Rarely    Review of Systems  Constitutional: Negative for fever.  Eyes: Negative for redness.  Respiratory: Negative for cough and shortness of breath.   Cardiovascular: Positive for chest pain. Negative for leg swelling.  Gastrointestinal: Negative for abdominal pain.  Genitourinary: Negative for flank pain.  Musculoskeletal: Negative for back pain and neck pain.  Skin: Negative for rash.  Neurological: Negative for headaches.  Hematological: Does not bruise/bleed easily.  Psychiatric/Behavioral: Negative for confusion.    Allergies  Amoxicillin  Home Medications   Current Outpatient Rx  Name  Route  Sig  Dispense  Refill  . azithromycin (ZITHROMAX) 250 MG tablet   Oral   Take 1 tablet (250 mg total) by mouth daily. Take first 2 tablets together, then 1 every day  until finished.   6 tablet   0   . estradiol valerate (DELESTROGEN) 40 MG/ML injection   Intramuscular   Inject 40 mg into the muscle every 14 (fourteen) days.          Marland Kitchen estrogens, conjugated, (PREMARIN) 1.25 MG tablet   Oral   Take 1.25 mg by mouth daily.         . predniSONE (DELTASONE) 10 MG tablet   Oral   Take 5 tablets (50 mg total) by mouth daily.   20 tablet   0   . spironolactone (ALDACTONE) 100 MG tablet   Oral   Take 100 mg by mouth daily.          BP 121/70  Temp(Src) 97.7 F (36.5 C) (Oral)  Resp 18  SpO2 95% Physical Exam  Nursing note and vitals reviewed. Constitutional: He is oriented to person, place, and time. He appears well-developed and well-nourished. No distress.  HENT:  Head: Atraumatic.  Eyes: Conjunctivae are normal. No scleral icterus.  Neck: Neck supple. No tracheal deviation present.  Cardiovascular: Normal rate, regular rhythm, normal heart sounds and intact distal pulses.  Exam reveals no gallop and no friction rub.   No murmur heard. Pulmonary/Chest: Effort normal and breath sounds normal. No accessory muscle usage. No respiratory distress. He exhibits tenderness.  Abdominal: Soft. He exhibits no distension. There is no tenderness.  Musculoskeletal: Normal range of motion. He exhibits no edema and no tenderness.  Neurological: He is alert and oriented to person,  place, and time.  Skin: Skin is warm and dry.  Psychiatric: He has a normal mood and affect.    ED Course  Procedures (including critical care time)   Results for orders placed during the hospital encounter of 12/26/12  CBC WITH DIFFERENTIAL      Result Value Range   WBC 7.4  4.0 - 10.5 K/uL   RBC 4.11 (*) 4.22 - 5.81 MIL/uL   Hemoglobin 12.8 (*) 13.0 - 17.0 g/dL   HCT 40.9 (*) 81.1 - 91.4 %   MCV 85.6  78.0 - 100.0 fL   MCH 31.1  26.0 - 34.0 pg   MCHC 36.4 (*) 30.0 - 36.0 g/dL   RDW 78.2  95.6 - 21.3 %   Platelets 150  150 - 400 K/uL   Neutrophils Relative %  53  43 - 77 %   Neutro Abs 4.0  1.7 - 7.7 K/uL   Lymphocytes Relative 38  12 - 46 %   Lymphs Abs 2.9  0.7 - 4.0 K/uL   Monocytes Relative 8  3 - 12 %   Monocytes Absolute 0.6  0.1 - 1.0 K/uL   Eosinophils Relative 1  0 - 5 %   Eosinophils Absolute 0.1  0.0 - 0.7 K/uL   Basophils Relative 0  0 - 1 %   Basophils Absolute 0.0  0.0 - 0.1 K/uL  COMPREHENSIVE METABOLIC PANEL      Result Value Range   Sodium 138  135 - 145 mEq/L   Potassium 3.3 (*) 3.5 - 5.1 mEq/L   Chloride 103  96 - 112 mEq/L   CO2 27  19 - 32 mEq/L   Glucose, Bld 91  70 - 99 mg/dL   BUN 10  6 - 23 mg/dL   Creatinine, Ser 0.86  0.50 - 1.35 mg/dL   Calcium 9.3  8.4 - 57.8 mg/dL   Total Protein 7.2  6.0 - 8.3 g/dL   Albumin 3.7  3.5 - 5.2 g/dL   AST 11  0 - 37 U/L   ALT 7  0 - 53 U/L   Alkaline Phosphatase 28 (*) 39 - 117 U/L   Total Bilirubin 0.4  0.3 - 1.2 mg/dL   GFR calc non Af Amer 87 (*) >90 mL/min   GFR calc Af Amer >90  >90 mL/min  TROPONIN I      Result Value Range   Troponin I <0.30  <0.30 ng/mL       EKG Interpretation     Ventricular Rate:  71 PR Interval:  173 QRS Duration: 100 QT Interval:  403 QTC Calculation: 438 R Axis:   20 Text Interpretation:  Sinus rhythm Left atrial enlargement No significant change since last tracing            MDM  Labs. Cxr.  Pt w chest wall tenderness. Notes symptom present x 2-3 months.   Motrin po.  Reviewed nursing notes and prior charts for additional history.   kcl mildly low, kcl po.  Recheck pt comfortable.  Pt had labs from triage,  After symptoms for prolonged period, trop neg. Symptoms and exam appear most consistent with non cardiac, possibly musculoskeletal cp.  Pt appears stable for d/c.     Suzi Roots, MD 12/27/12 [phone]

## 2012-12-26 NOTE — ED Notes (Signed)
Pt has had arm, face, chest and neck pain to left side that started one month ago and pain resumed an hour ago. Pt family reports previous sudden headaches with eye pain 2 weeks ago. Pt denies headache at this time. Pt states that he sometimes gets dizzy and nauseated.

## 2013-05-04 ENCOUNTER — Emergency Department (INDEPENDENT_AMBULATORY_CARE_PROVIDER_SITE_OTHER)
Admission: EM | Admit: 2013-05-04 | Discharge: 2013-05-04 | Disposition: A | Discharge status: Home / Self Care | Payer: Self-pay | Source: Home / Self Care

## 2013-05-04 ENCOUNTER — Encounter (HOSPITAL_COMMUNITY): Payer: Self-pay | Admitting: Emergency Medicine

## 2013-05-04 DIAGNOSIS — A088 Other specified intestinal infections: Secondary | ICD-10-CM

## 2013-05-04 DIAGNOSIS — A084 Viral intestinal infection, unspecified: Secondary | ICD-10-CM

## 2013-05-04 DIAGNOSIS — Z114 Encounter for screening for human immunodeficiency virus [HIV]: Secondary | ICD-10-CM

## 2013-05-04 DIAGNOSIS — Z1159 Encounter for screening for other viral diseases: Secondary | ICD-10-CM

## 2013-05-04 DIAGNOSIS — Z7251 High risk heterosexual behavior: Secondary | ICD-10-CM

## 2013-05-04 NOTE — Discharge Instructions (Signed)
Viral Gastroenteritis °Viral gastroenteritis is also known as stomach flu. This condition affects the stomach and intestinal tract. It can cause sudden diarrhea and vomiting. The illness typically lasts 3 to 8 days. Most people develop an immune response that eventually gets rid of the virus. While this natural response develops, the virus can make you quite ill. °CAUSES  °Many different viruses can cause gastroenteritis, such as rotavirus or noroviruses. You can catch one of these viruses by consuming contaminated food or water. You may also catch a virus by sharing utensils or other personal items with an infected person or by touching a contaminated surface. °SYMPTOMS  °The most common symptoms are diarrhea and vomiting. These problems can cause a severe loss of body fluids (dehydration) and a body salt (electrolyte) imbalance. Other symptoms may include: °· Fever. °· Headache. °· Fatigue. °· Abdominal pain. °DIAGNOSIS  °Your caregiver can usually diagnose viral gastroenteritis based on your symptoms and a physical exam. A stool sample may also be taken to test for the presence of viruses or other infections. °TREATMENT  °This illness typically goes away on its own. Treatments are aimed at rehydration. The most serious cases of viral gastroenteritis involve vomiting so severely that you are not able to keep fluids down. In these cases, fluids must be given through an intravenous line (IV). °HOME CARE INSTRUCTIONS  °· Drink enough fluids to keep your urine clear or pale yellow. Drink small amounts of fluids frequently and increase the amounts as tolerated. °· Ask your caregiver for specific rehydration instructions. °· Avoid: °· Foods high in sugar. °· Alcohol. °· Carbonated drinks. °· Tobacco. °· Juice. °· Caffeine drinks. °· Extremely hot or cold fluids. °· Fatty, greasy foods. °· Too much intake of anything at one time. °· Dairy products until 24 to 48 hours after diarrhea stops. °· You may consume probiotics.  Probiotics are active cultures of beneficial bacteria. They may lessen the amount and number of diarrheal stools in adults. Probiotics can be found in yogurt with active cultures and in supplements. °· Wash your hands well to avoid spreading the virus. °· Only take over-the-counter or prescription medicines for pain, discomfort, or fever as directed by your caregiver. Do not give aspirin to children. Antidiarrheal medicines are not recommended. °· Ask your caregiver if you should continue to take your regular prescribed and over-the-counter medicines. °· Keep all follow-up appointments as directed by your caregiver. °SEEK IMMEDIATE MEDICAL CARE IF:  °· You are unable to keep fluids down. °· You do not urinate at least once every 6 to 8 hours. °· You develop shortness of breath. °· You notice blood in your stool or vomit. This may look like coffee grounds. °· You have abdominal pain that increases or is concentrated in one small area (localized). °· You have persistent vomiting or diarrhea. °· You have a fever. °· The patient is a child younger than 3 months, and he or she has a fever. °· The patient is a child older than 3 months, and he or she has a fever and persistent symptoms. °· The patient is a child older than 3 months, and he or she has a fever and symptoms suddenly get worse. °· The patient is a baby, and he or she has no tears when crying. °MAKE SURE YOU:  °· Understand these instructions. °· Will watch your condition. °· Will get help right away if you are not doing well or get worse. °Document Released: 03/02/2005 Document Revised: 05/25/2011 Document Reviewed: 12/17/2010 °  ExitCare Patient Information 2014 [place], [place].  Sexually Transmitted Disease A sexually transmitted disease (STD) is a disease or infection that may be passed (transmitted) from person to person, usually during sexual activity. This may happen by way of saliva, semen, blood, vaginal mucus, or urine. Common STDs include:    Gonorrhea.   Chlamydia.   Syphilis.   HIV and AIDS.   Genital herpes.   Hepatitis B and C.   Trichomonas.   Human papillomavirus (HPV).   Pubic lice.   Scabies.  Mites.  Bacterial vaginosis. WHAT ARE CAUSES OF STDs? An STD may be caused by bacteria, a virus, or parasites. STDs are often transmitted during sexual activity if one person is infected. However, they may also be transmitted through nonsexual means. STDs may be transmitted after:   Sexual intercourse with an infected person.   Sharing sex toys with an infected person.   Sharing needles with an infected person or using unclean piercing or tattoo needles.  Having intimate contact with the genitals, mouth, or rectal areas of an infected person.   Exposure to infected fluids during birth. WHAT ARE THE SIGNS AND SYMPTOMS OF STDs? Different STDs have different symptoms. Some people may not have any symptoms. If symptoms are present, they may include:   Painful or bloody urination.   Pain in the pelvis, abdomen, vagina, anus, throat, or eyes.   Skin rash, itching, irritation, growths, sores (lesions), ulcerations, or warts in the genital or anal area.  Abnormal vaginal discharge with or without bad odor.   Penile discharge in men.   Fever.   Pain or bleeding during sexual intercourse.   Swollen glands in the groin area.   Yellow skin and eyes (jaundice). This is seen with hepatitis.   Swollen testicles.  Infertility.  Sores and blisters in the mouth. HOW ARE STDs DIAGNOSED? To make a diagnosis, your health care provider may:   Take a medical history.   Perform a physical exam.   Take a sample of any discharge for examination.  Swab the throat, cervix, opening to the penis, rectum, or vagina for testing.  Test a sample of your first morning urine.   Perform blood tests.   Perform a Pap smear, if this applies.   Perform a colposcopy.   Perform a  laparoscopy.  HOW ARE STDs TREATED? Treatment depends on the STD. Some STDs may be treated but not cured.   Chlamydia, gonorrhea, trichomonas, and syphilis can be cured with antibiotics.   Genital herpes, hepatitis, and HIV can be treated, but not cured, with prescribed medicines. The medicines lessen symptoms.   Genital warts from HPV can be treated with medicine or by freezing, burning (electrocautery), or surgery. Warts may come back.   HPV cannot be cured with medicine or surgery. However, abnormal areas may be removed from the cervix, vagina, or vulva.   If your diagnosis is confirmed, your recent sexual partners need treatment. This is true even if they are symptom-free or have a negative culture or evaluation. They should not have sex until their health care providers say it is OK. HOW CAN I REDUCE MY RISK OF GETTING AN STD?  Use latex condoms, dental dams, and water-soluble lubricants during sexual activity. Do not use petroleum jelly or oils.  Get vaccinated for HPV and hepatitis. If you have not received these vaccines in the past, talk to your health care provider about whether one or both might be right for you.   Avoid risky sex practices  that can break the skin.  WHAT SHOULD I DO IF I THINK I HAVE AN STD?  See your health care provider.   Inform all sexual partners. They should be tested and treated for any STDs.  Do not have sex until your health care provider says it is OK. WHEN SHOULD I GET HELP? Seek immediate medical care if:  You develop severe abdominal pain.  You are a man and notice swelling or pain in the testicles.  You are a woman and notice swelling or pain in your vagina. Document Released: 05/23/2002 Document Revised: 12/21/2012 Document Reviewed: 09/20/2012 [facility] Patient Information 2014 [place], [place].  Safe Sex Safe sex is about reducing the risk of giving or getting a sexually transmitted disease (STD). STDs are spread through  sexual contact involving the genitals, mouth, or rectum. Some STDS can be cured and others cannot. Safe sex can also prevent unintended pregnancies.  SAFE SEX PRACTICES  Limit your sexual activity to only one partner who is only having sex with you.  Talk to your partner about their past partners, past STDs, and drug use.  Use a condom every time you have sexual intercourse. This includes vaginal, oral, and anal sexual activity. Both females and males should wear condoms during oral sex. Only use latex or polyurethane condoms and water-based lubricants. Petroleum-based lubricants or oils used to lubricate a condom will weaken the condom and increase the chance that it will break. The condom should be in place from the beginning to the end of sexual activity. Wearing a condom reduces, but does not completely eliminate, your risk of getting or giving a STD. STDs can be spread by contact with skin of surrounding areas.  Get vaccinated for hepatitis B and HPV.  Avoid alcohol and recreational drugs which can affect your judgement. You may forget to use a condom or participate in high-risk sex.  For females, avoid douching after sexual intercourse. Douching can spread an infection farther into the reproductive tract.  Check your body for signs of sores, blisters, rashes, or unusual discharge. See your caregiver if you notice any of these signs.  Avoid sexual contact if you have symptoms of an infection or are being treated for an STD. If you or your partner has herpes, avoid sexual contact when blisters are present. Use condoms at all other times.  See your caregiver for regular screenings, examinations, and tests for STDs. Before having sex with a new partner, each of you should be screened for STDs and talk about the results with your partner. BENEFITS OF SAFE SEX   There is less of a chance of getting or giving an STD.  You can prevent unwanted or unintended pregnancies.  By discussing safer  sex concerns with your partner, you may increase feelings of intimacy, comfort, trust, and honesty between the both of you. Document Released: 04/09/2004 Document Revised: 11/25/2011 Document Reviewed: 08/24/2011 [facility]Exit[facility] Patient Information 2014 [place], [place].

## 2013-05-04 NOTE — ED Provider Notes (Signed)
Medical screening examination/treatment/procedure(s) were performed by resident physician or non-physician practitioner and as supervising physician I was immediately available for consultation/collaboration.   Malick Netz DOUGLAS MD.   Cliffard Hair D Yulieth Carrender, MD 05/04/13 2110 

## 2013-05-04 NOTE — ED Notes (Signed)
Verified call back number.  

## 2013-05-04 NOTE — ED Provider Notes (Signed)
CSN: [id number]     Arrival date & time 05/04/13  1918 History   First MD Initiated Contact with Patient 05/04/13 1949     Chief Complaint  Patient presents with  . Weakness     (Consider location/radiation/quality/duration/timing/severity/associated sxs/prior Treatment) HPI Comments: 28-year-old transgender male to male presents with complaints of weakness and nausea for 2 days. She says she has had a weight loss possibly up to 10 pounds according to the way she feels and what other people told her. She denies vomiting or diarrhea although she has had a couple of loose stools today. Denies fever. Her primary objective today he is to obtain an HIV screen.   History reviewed. No pertinent past medical history. Past Surgical History  Procedure Laterality Date  . Cosmetic surgery     No family history on file. History  Substance Use Topics  . Smoking status: Former Smoker -- 1.00 packs/day  . Smokeless tobacco: Not on file  . Alcohol Use: Yes     Comment: Rarely    Review of Systems  Constitutional: Positive for activity change and fatigue.  HENT: Negative.   Respiratory: Negative.   Cardiovascular: Negative.   Gastrointestinal: Positive for nausea. Negative for vomiting.  Genitourinary: Negative.   Musculoskeletal: Negative.   Skin: Negative.   Neurological: Negative.       Allergies  Amoxicillin  Home Medications   Current Outpatient Rx  Name  Route  Sig  Dispense  Refill  . estradiol valerate (DELESTROGEN) 40 MG/ML injection   Intramuscular   Inject 40 mg into the muscle every 14 (fourteen) days.          Marland Kitchen. estrogens, conjugated, (PREMARIN) 1.25 MG tablet   Oral   Take 1.25 mg by mouth daily.         Marland Kitchen. SPIRONOLACTONE PO   Oral   Take by mouth.          BP 132/82  Pulse 60  Temp(Src) 97.6 F (36.4 C) (Oral)  Resp 16  SpO2 99% Physical Exam  Nursing note and vitals reviewed. Constitutional: He is oriented to person, place, and time. He  appears well-developed and well-nourished. No distress.  HENT:  Mouth/Throat: Oropharynx is clear and moist. No oropharyngeal exudate.  Bilateral TMs are normal  Eyes: Conjunctivae and EOM are normal.  Neck: Normal range of motion. Neck supple.  Cardiovascular: Normal rate and regular rhythm.   Pulmonary/Chest: Effort normal and breath sounds normal.  Abdominal: Soft. He exhibits no distension. There is no tenderness. There is no rebound and no guarding.  Musculoskeletal: He exhibits no edema and no tenderness.  Neurological: He is alert and oriented to person, place, and time.  Skin: Skin is warm and dry.    ED Course  Procedures (including critical care time) Labs Review Labs Reviewed  HIV ANTIBODY (ROUTINE TESTING)   Imaging Review No results found.    MDM   Final diagnoses:  Viral gastroenteritis  Risky sexual behavior  Screening for HIV (human immunodeficiency virus)     Draw HIV Plenty of clear liquids Declined meds Recommended to obtain PCP  Hayden Rasmussenavid Grover [place], NP 05/04/13 2044

## 2013-05-04 NOTE — ED Notes (Signed)
Pt c/o feeling weak since yesterday Also reports she has lost 10 lbs?? Since yest sxs also include: nauseas, diarrhea Alert w/no signs of acute distress.

## 2013-05-05 LAB — HIV ANTIBODY (ROUTINE TESTING W REFLEX): HIV: NONREACTIVE

## 2013-05-11 ENCOUNTER — Emergency Department (HOSPITAL_COMMUNITY)
Admission: EM | Admit: 2013-05-11 | Discharge: 2013-05-11 | Disposition: A | Discharge status: Home / Self Care | Payer: Self-pay | Attending: Emergency Medicine | Admitting: Emergency Medicine

## 2013-05-11 ENCOUNTER — Encounter (HOSPITAL_COMMUNITY): Payer: Self-pay | Admitting: Emergency Medicine

## 2013-05-11 DIAGNOSIS — R630 Anorexia: Secondary | ICD-10-CM | POA: Insufficient documentation

## 2013-05-11 DIAGNOSIS — R0602 Shortness of breath: Secondary | ICD-10-CM | POA: Insufficient documentation

## 2013-05-11 DIAGNOSIS — H9209 Otalgia, unspecified ear: Secondary | ICD-10-CM | POA: Insufficient documentation

## 2013-05-11 DIAGNOSIS — R634 Abnormal weight loss: Secondary | ICD-10-CM | POA: Insufficient documentation

## 2013-05-11 DIAGNOSIS — J329 Chronic sinusitis, unspecified: Secondary | ICD-10-CM | POA: Insufficient documentation

## 2013-05-11 DIAGNOSIS — Z79899 Other long term (current) drug therapy: Secondary | ICD-10-CM | POA: Insufficient documentation

## 2013-05-11 DIAGNOSIS — Z87891 Personal history of nicotine dependence: Secondary | ICD-10-CM | POA: Insufficient documentation

## 2013-05-11 HISTORY — DX: Hormone replacement therapy: Z79.890

## 2013-05-11 NOTE — ED Provider Notes (Signed)
CSN: [id number]     Arrival date & time 05/11/13  1403 History   First MD Initiated Contact with Patient 05/11/13 1413     Chief Complaint  Patient presents with  . Shortness of Breath  . URI     (Consider location/radiation/quality/duration/timing/severity/associated sxs/prior Treatment) HPI Frederick Hess is a 28 y.o. male who is transgender, currently on hormones, who presented to ed complaining of nasal congestion. Patient states that her nasal congestion started 3 days ago. She [place] and was given prescription for Bactrim and Zyrtec. Patient states that she's currently taking both medications but states that her nasal congestion is not improving. She states that she feels that she can't breathe through her nose. She denies any cough. She denies any chest pain. She denies any fever or chills. She denies any sore throat. She states she does feel some sinus pressure over her face and states pain radiates into the left ear. Patient states that her second complaint is weight loss. She states that she lost 10 pounds in the last month. She states she was not eating prior due to stress, but states that she started to be more again. She denies any night sweats. She denies any fevers. He's not coughing. She has no swelling in her legs.  Past Medical History  Diagnosis Date  . Hormone replacement therapy    Past Surgical History  Procedure Laterality Date  . Cosmetic surgery      rhinoplasty   No family history on file. History  Substance Use Topics  . Smoking status: Former Smoker -- 1.00 packs/day  . Smokeless tobacco: Not on file  . Alcohol Use: Yes     Comment: Rarely    Review of Systems  Constitutional: Positive for appetite change and unexpected weight change. Negative for fever and chills.  HENT: Positive for congestion, ear pain and sinus pressure. Negative for sore throat.   Respiratory: Negative for cough, chest  tightness and shortness of breath.   Cardiovascular: Negative for chest pain, palpitations and leg swelling.  Gastrointestinal: Negative for nausea, vomiting, abdominal pain, diarrhea and abdominal distention.  Genitourinary: Negative for dysuria.  Musculoskeletal: Negative for arthralgias, myalgias, neck pain and neck stiffness.  Skin: Negative for rash.  Allergic/Immunologic: Negative for immunocompromised state.  Neurological: Negative for dizziness, weakness, light-headedness, numbness and headaches.  All other systems reviewed and are negative.      Allergies  Amoxicillin  Home Medications   Current Outpatient Rx  Name  Route  Sig  Dispense  Refill  . estradiol valerate (DELESTROGEN) 40 MG/ML injection   Intramuscular   Inject 40 mg into the muscle every 14 (fourteen) days.          Marland Kitchen. estrogens, conjugated, (PREMARIN) 1.25 MG tablet   Oral   Take 1.25 mg by mouth daily.         Marland Kitchen. SPIRONOLACTONE PO   Oral   Take 1 tablet by mouth daily.           BP 136/78  Pulse 83  Temp(Src) 97.3 F (36.3 C) (Oral)  Resp 22  Ht 6' (1.829 m)  Wt 180 lb (81.647 kg)  BMI 24.41 kg/m2  SpO2 99% Physical Exam  Nursing note and vitals reviewed. Constitutional: He is oriented to person, place, and time. He appears well-developed and well-nourished. No distress.  HENT:  Head: Normocephalic and atraumatic.  Right Ear: External ear normal.  Left Ear:  External ear normal.  Mouth/Throat: Oropharynx is clear and moist.  Nasal mucosal edema, clear rhinorrhea. Tenderness over maxillary sinuses bilaterally. TMs and ear canals are normal bilaterally.  Eyes: Conjunctivae are normal. Pupils are equal, round, and reactive to light.  Neck: Normal range of motion. Neck supple.  Cardiovascular: Normal rate, regular rhythm and normal heart sounds.   Pulmonary/Chest: Effort normal. No respiratory distress. He has no wheezes. He has no rales.  Abdominal: Soft. Bowel sounds are normal. He  exhibits no distension. There is no tenderness. There is no rebound.  Musculoskeletal: He exhibits no edema.  Lymphadenopathy:    He has no cervical adenopathy.  Neurological: He is alert and oriented to person, place, and time.  Skin: Skin is warm and dry.    ED Course  Procedures (including critical care time) Labs Review Labs Reviewed - No data to display Imaging Review No results found.  EKG Interpretation   None       MDM   Final diagnoses:  Sinusitis    Patient is an otherwise healthy male transgender. Presents with nasal congestion for 3 days and progressive weight loss over last month. Her vital signs are all within normal. Lungs are clear on exam. Exam is only positive for nasal congestion and sinus pressure. Patient is already taking Bactrim for her symptoms, has been on these medications for 2-1/2 days. She is to continue her antibiotic and Zyrtec. Also a structure to use saline for congestion. As far as her weight loss, patient did admit to me that she has not been eating as much and I suggested this could be the cause of it. Given normal exam otherwise, no signs of severe infection, no exposure to TB, no abdominal pain or chest pain, no signs of dehydration on vital signes, i recommended pt to follow up outpatient with her primary care doctor. She became angry and stated she does not have one. I have explained to her this may not be something we can figure out in ED, and i have encouraged her to eat. Pt then became very condescending and used foul language. Security was called. Pt walked out before getting her papers.   Filed Vitals:   05/11/13 1414  BP: 136/78  Pulse: 83  Temp: 97.3 F (36.3 C)  TempSrc: Oral  Resp: 22  Height: 6' (1.829 m)  Weight: 180 lb (81.647 kg)  SpO2: 99%      Faten Frieson A Margee Trentham, PA-C 05/11/13 1544

## 2013-05-11 NOTE — ED Notes (Signed)
[facility]. Pt. verified with picture ID and told HIV neg. I told him if he had an exposure to get rechecked in 6 mos.  He voiced understanding. Vassie MoselleYork, Ethne Jeon M 05/11/2013

## 2013-05-11 NOTE — ED Notes (Signed)
Patient seen walking out of the department.  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  He did not stop to talk with nurse first prior to leaving.

## 2013-05-11 NOTE — ED Notes (Signed)
Pt was given antibiotics and zyrtec for a sinus infection on 2/23.  Pt states that since then she has been taking meds but now feels she is losing weight and can't breath.  VS wnl.  Also c/o pressure behind L eye and to L ear.

## 2013-05-11 NOTE — Discharge Instructions (Signed)
Use saline nasal spray for congestion. Continue antibiotics. Follow up with a primary care doctor regarding your weight loss and to make sure symptoms improve.    Sinusitis Sinusitis is redness, soreness, and swelling (inflammation) of the paranasal sinuses. Paranasal sinuses are air pockets within the bones of your face (beneath the eyes, the middle of the forehead, or above the eyes). In healthy paranasal sinuses, mucus is able to drain out, and air is able to circulate through them by way of your nose. However, when your paranasal sinuses are inflamed, mucus and air can become trapped. This can allow bacteria and other germs to grow and cause infection. Sinusitis can develop quickly and last only a short time (acute) or continue over a long period (chronic). Sinusitis that lasts for more than 12 weeks is considered chronic.  CAUSES  Causes of sinusitis include:  Allergies.  Structural abnormalities, such as displacement of the cartilage that separates your nostrils (deviated septum), which can decrease the air flow through your nose and sinuses and affect sinus drainage.  Functional abnormalities, such as when the small hairs (cilia) that line your sinuses and help remove mucus do not work properly or are not present. SYMPTOMS  Symptoms of acute and chronic sinusitis are the same. The primary symptoms are pain and pressure around the affected sinuses. Other symptoms include:  Upper toothache.  Earache.  Headache.  Bad breath.  Decreased sense of smell and taste.  A cough, which worsens when you are lying flat.  Fatigue.  Fever.  Thick drainage from your nose, which often is green and may contain pus (purulent).  Swelling and warmth over the affected sinuses. DIAGNOSIS  Your caregiver will perform a physical exam. During the exam, your caregiver may:  Look in your nose for signs of abnormal growths in your nostrils (nasal polyps).  Tap over the affected sinus to check for  signs of infection.  View the inside of your sinuses (endoscopy) with a special imaging device with a light attached (endoscope), which is inserted into your sinuses. If your caregiver suspects that you have chronic sinusitis, one or more of the following tests may be recommended:  Allergy tests.  Nasal culture A sample of mucus is taken from your nose and sent to a lab and screened for bacteria.  Nasal cytology A sample of mucus is taken from your nose and examined by your caregiver to determine if your sinusitis is related to an allergy. TREATMENT  Most cases of acute sinusitis are related to a viral infection and will resolve on their own within 10 days. Sometimes medicines are prescribed to help relieve symptoms (pain medicine, decongestants, nasal steroid sprays, or saline sprays).  However, for sinusitis related to a bacterial infection, your caregiver will prescribe antibiotic medicines. These are medicines that will help kill the bacteria causing the infection.  Rarely, sinusitis is caused by a fungal infection. In theses cases, your caregiver will prescribe antifungal medicine. For some cases of chronic sinusitis, surgery is needed. Generally, these are cases in which sinusitis recurs more than 3 times per year, despite other treatments. HOME CARE INSTRUCTIONS   Drink plenty of water. Water helps thin the mucus so your sinuses can drain more easily.  Use a humidifier.  Inhale steam 3 to 4 times a day (for example, sit in the bathroom with the shower running).  Apply a warm, moist washcloth to your face 3 to 4 times a day, or as directed by your caregiver.  Use saline nasal  sprays to help moisten and clean your sinuses.  Take over-the-counter or prescription medicines for pain, discomfort, or fever only as directed by your caregiver. SEEK IMMEDIATE MEDICAL CARE IF:  You have increasing pain or severe headaches.  You have nausea, vomiting, or drowsiness.  You have swelling  around your face.  You have vision problems.  You have a stiff neck.  You have difficulty breathing. MAKE SURE YOU:   Understand these instructions.  Will watch your condition.  Will get help right away if you are not doing well or get worse. Document Released: 03/02/2005 Document Revised: 05/25/2011 Document Reviewed: 03/17/2011 [facility] Patient Information 2014 [place], [place].

## 2013-05-12 ENCOUNTER — Emergency Department (HOSPITAL_COMMUNITY): Payer: Self-pay

## 2013-05-12 ENCOUNTER — Emergency Department (HOSPITAL_COMMUNITY)
Admission: EM | Admit: 2013-05-12 | Discharge: 2013-05-12 | Disposition: A | Discharge status: Home / Self Care | Payer: Self-pay | Attending: Emergency Medicine | Admitting: Emergency Medicine

## 2013-05-12 ENCOUNTER — Encounter (HOSPITAL_COMMUNITY): Payer: Self-pay | Admitting: Emergency Medicine

## 2013-05-12 ENCOUNTER — Other Ambulatory Visit: Payer: Self-pay

## 2013-05-12 DIAGNOSIS — Z880 Allergy status to penicillin: Secondary | ICD-10-CM | POA: Insufficient documentation

## 2013-05-12 DIAGNOSIS — Z79899 Other long term (current) drug therapy: Secondary | ICD-10-CM | POA: Insufficient documentation

## 2013-05-12 DIAGNOSIS — R42 Dizziness and giddiness: Secondary | ICD-10-CM | POA: Insufficient documentation

## 2013-05-12 DIAGNOSIS — Z792 Long term (current) use of antibiotics: Secondary | ICD-10-CM | POA: Insufficient documentation

## 2013-05-12 DIAGNOSIS — Z791 Long term (current) use of non-steroidal anti-inflammatories (NSAID): Secondary | ICD-10-CM | POA: Insufficient documentation

## 2013-05-12 DIAGNOSIS — R55 Syncope and collapse: Secondary | ICD-10-CM | POA: Insufficient documentation

## 2013-05-12 DIAGNOSIS — R61 Generalized hyperhidrosis: Secondary | ICD-10-CM | POA: Insufficient documentation

## 2013-05-12 DIAGNOSIS — H538 Other visual disturbances: Secondary | ICD-10-CM | POA: Insufficient documentation

## 2013-05-12 DIAGNOSIS — Z87891 Personal history of nicotine dependence: Secondary | ICD-10-CM | POA: Insufficient documentation

## 2013-05-12 DIAGNOSIS — R5381 Other malaise: Secondary | ICD-10-CM | POA: Insufficient documentation

## 2013-05-12 DIAGNOSIS — J3489 Other specified disorders of nose and nasal sinuses: Secondary | ICD-10-CM | POA: Insufficient documentation

## 2013-05-12 DIAGNOSIS — R5383 Other fatigue: Secondary | ICD-10-CM

## 2013-05-12 LAB — COMPREHENSIVE METABOLIC PANEL
ALK PHOS: 42 U/L (ref 39–117)
ALT: 7 U/L (ref 0–53)
AST: 11 U/L (ref 0–37)
Albumin: 3.3 g/dL — ABNORMAL LOW (ref 3.5–5.2)
BUN: 12 mg/dL (ref 6–23)
CHLORIDE: 104 meq/L (ref 96–112)
CO2: 26 mEq/L (ref 19–32)
Calcium: 9.3 mg/dL (ref 8.4–10.5)
Creatinine, Ser: 1.13 mg/dL (ref 0.50–1.35)
GFR, EST NON AFRICAN AMERICAN: 87 mL/min — AB (ref >90)
GLUCOSE: 90 mg/dL (ref 70–99)
POTASSIUM: 4.2 meq/L (ref 3.7–5.3)
Sodium: 140 mEq/L (ref 137–147)
Total Bilirubin: <0.2 mg/dL — ABNORMAL LOW (ref 0.3–1.2)
Total Protein: 7.4 g/dL (ref 6.0–8.3)

## 2013-05-12 LAB — CBC WITH DIFFERENTIAL/PLATELET
BASOS ABS: 0 10*3/uL (ref 0.0–0.1)
BASOS PCT: 0 % (ref 0–1)
EOS ABS: 0.1 10*3/uL (ref 0.0–0.7)
Eosinophils Relative: 2 % (ref 0–5)
HCT: 36.3 % — ABNORMAL LOW (ref 39.0–52.0)
Hemoglobin: 13.1 g/dL (ref 13.0–17.0)
Lymphocytes Relative: 24 % (ref 12–46)
Lymphs Abs: 1.5 10*3/uL (ref 0.7–4.0)
MCH: 31.1 pg (ref 26.0–34.0)
MCHC: 36.1 g/dL — AB (ref 30.0–36.0)
MCV: 86.2 fL (ref 78.0–100.0)
Monocytes Absolute: 0.5 10*3/uL (ref 0.1–1.0)
Monocytes Relative: 9 % (ref 3–12)
Neutro Abs: 4 10*3/uL (ref 1.7–7.7)
Neutrophils Relative %: 66 % (ref 43–77)
Platelets: 138 10*3/uL — ABNORMAL LOW (ref 150–400)
RBC: 4.21 MIL/uL — ABNORMAL LOW (ref 4.22–5.81)
RDW: 12.6 % (ref 11.5–15.5)
WBC: 6.1 10*3/uL (ref 4.0–10.5)

## 2013-05-12 LAB — URINALYSIS, ROUTINE W REFLEX MICROSCOPIC
BILIRUBIN URINE: NEGATIVE
Glucose, UA: NEGATIVE mg/dL
HGB URINE DIPSTICK: NEGATIVE
Ketones, ur: NEGATIVE mg/dL
Leukocytes, UA: NEGATIVE
NITRITE: NEGATIVE
PROTEIN: NEGATIVE mg/dL
Specific Gravity, Urine: 1.013 (ref 1.005–1.030)
Urobilinogen, UA: 0.2 mg/dL (ref 0.0–1.0)
pH: 6.5 (ref 5.0–8.0)

## 2013-05-12 LAB — LIPASE, BLOOD: Lipase: 16 U/L (ref 11–59)

## 2013-05-12 LAB — RAPID URINE DRUG SCREEN, HOSP PERFORMED
Amphetamines: NOT DETECTED
BARBITURATES: NOT DETECTED
Benzodiazepines: NOT DETECTED
COCAINE: NOT DETECTED
OPIATES: NOT DETECTED
Tetrahydrocannabinol: POSITIVE — AB

## 2013-05-12 MED ORDER — IBUPROFEN 800 MG PO TABS: 800.0000 mg | ORAL_TABLET | Freq: Three times a day (TID) | ORAL | Status: DC

## 2013-05-12 MED ORDER — SODIUM CHLORIDE 0.9 % IV BOLUS (SEPSIS)
1000.0000 mL | Freq: Once | INTRAVENOUS | Status: AC
  Administered 2013-05-12: 1000 mL via INTRAVENOUS

## 2013-05-12 MED ORDER — SODIUM CHLORIDE 0.9 % IV SOLN
INTRAVENOUS | Status: DC
  Administered 2013-05-12: 10:00:00 via INTRAVENOUS

## 2013-05-12 NOTE — ED Notes (Signed)
Pt states he was standing, felt dizzy, broke a sweat, fell down on tile. Pt states he lost consciousness. Called EMS. Pt states he hit the back of his head and woke up on his back. Fall was witnessed by two friends. Pt states right now he feels lightheaded, woozy, and weak. Pt in NAD, AAOx4, skin warm and dry. Mother at bedside.

## 2013-05-12 NOTE — Discharge Instructions (Signed)
Resource guide provided below to help find a local Dr. Additional workup is needed. Today's workup negative return for any recurrent passing out or new or worse symptoms. Lab and study results provided.    Emergency Department Resource Guide 1) Find a Doctor and Pay Out of Pocket Although you won't have to find out who is covered by your insurance plan, it is a good idea to ask around and get recommendations. You will then need to call the office and see if the doctor you have chosen will accept you as a new patient and what types of options they offer for patients who are self-pay. Some doctors offer discounts or will set up payment plans for their patients who do not have insurance, but you will need to ask so you aren't surprised when you get to your appointment.  2) Contact Your Local Health Department Not all health departments have doctors that can see patients for sick visits, but many do, so it is worth a call to see if yours does. If you don't know where your local health department is, you can check in your phone book. The CDC also has a tool to help you locate your state's health department, and many state websites also have listings of all of their local health departments.  3) Find a Walk-in Clinic If your illness is not likely to be very severe or complicated, you may want to try a walk in clinic. These are popping up all over the country in pharmacies, drugstores, and shopping centers. They're usually staffed by nurse practitioners or physician assistants that have been trained to treat common illnesses and complaints. They're usually fairly quick and inexpensive. However, if you have serious medical issues or chronic medical problems, these are probably not your best option.  No Primary Care Doctor: - Call Health Connect at  [phone] - they can help you locate a primary care doctor that  accepts your insurance, provides certain services, etc. - Physician Referral Service-  [phone]  Chronic Pain Problems: Organization         Address  Phone   Notes  [facility]  [phone] Patients need to be referred by their primary care doctor.   Medication Assistance: Organization         Address  Phone   Notes  [facility] [address] [place], [place] [address] [phone] --Must be a resident of [facility] [place] -- Must have NO insurance coverage whatsoever (no Medicaid/ Medicare, etc.) -- The pt. MUST have a primary care doctor that directs their care regularly and follows them in the community   MedAssist  [phone]   [employer]  [phone]) [phone]    Agencies that provide inexpensive medical care: Organization         Address  Phone   Notes  [facility]  [phone]   [facility]    [phone]   [facility] [address] [place], [place] [address] [phone]   [facility] [address], [place] [phone]   Planned Parenthood    [phone]   [facility]    [phone]   [facility]  [address], [place] Phone:  [phone], Fax:  [phone] Hours of Operation:  9 am - 6 pm, M-F.  Also accepts Medicaid/Medicare and self-pay.  [facility]  [address], [place] Phone: [phone], Fax: [phone]. Hours of Operation:  8:30 am - 5:30 pm, M-F.  Also accepts Medicaid and self-pay.  [facility] [place] [address] [place] Phone: [phone]   [place], [place], [place] [phone], Ext. [phone] Mondays & Thursdays: 7-9 AM.  First 15 patients are seen on a first come, first serve basis.    [place] Providers:  Organization         Address  Phone   Notes  [facility] [address],  [place] [phone] Also accepts self-pay patients.  [facility] [id number] [address]. [address]. Garcia, [place]  [phone]   [place], [address], [place] [phone]   [facility] [address] [place] [phone]   Lucianne Lei [address], [place]   [phone] Only accepts [place] Access [place] patients after they have their name applied to their card.   Self-Pay (no insurance) in [facility]:  Organization         Address  Phone   Notes  Sickle Cell Patients, [facility] Internal Medicine [place] [phone]   [facility] Urgent Care [place] [phone]   [facility] [place]  [place], [place], [place] [phone]   [facility]/Dr. Osei-Bonsu  [address] [place] or [address], [place] [phone] Phone number for both [place] and [place] locations is the same.  [facility] [address] [place] [phone]   [facility] [address] [place] or [address] [place] [address] [phone]   [facility] [address] [place] [phone], phone; [phone], fax Sees patients 1st and 3rd Saturday of every month.  Must not qualify for public or private insurance (i.e. Medicaid, Medicare, [place] Health Choice, Veterans' Benefits)  Household income should be no more than 200% of the poverty level The clinic cannot treat you if you are pregnant or think you are pregnant  Sexually transmitted diseases are not treated at the clinic.    Dental Care: Organization         Address  Phone  Notes  [facility] Department of [facility] [phone] Accepts children up to age 64 who are enrolled in  [place] or [place]; pregnant women with a Medicaid card; and children who have applied for Medicaid or [place] Health Choice, but were declined, whose parents can pay a reduced fee at time of service.  [facility] Department of [facility]  [address], [place] [phone] Accepts children up to age 59 who are enrolled in [place] or [place]; pregnant women with a Medicaid card; and children who have applied for Medicaid or [place] Health Choice, but were declined, whose parents can pay a reduced fee at time of service.  [facility] Access PROGRAM  [place] [phone] Patients are seen by appointment only. Walk-ins are not accepted. [place] will see patients 70 years of age and older. Monday - Tuesday (8am-5pm) Most Wednesdays (8:30-5pm) $30 per visit, cash only  [place] [facility]  [address], [facility] [phone] Patients are seen by appointment only. Walk-ins are not accepted. [place] will see patients 55 years of age and older. One Wednesday Evening (Monthly: Volunteer Based).  $30 per visit, cash only  [place]  [phone] for adults; Children under age 16, call Graduate Pediatric Dentistry at [phone]. Children aged 15-14, please call [phone] to request a pediatric application.  Dental services are provided in all areas of dental care including fillings, crowns and bridges, complete and partial dentures, implants, gum treatment, root canals, and extractions. Preventive care is also provided. Treatment is provided to both adults and children. Patients are selected via a lottery and there is often a waiting list.   [facility] [address] [place]  [phone] [email or web address]   [facility] [address] [place], [place] [phone], Ext. [phone] Second and Fourth Thursday of each month, opens at 6:30  AM; Clinic ends at 9 AM.  Patients are seen on a first-come first-served basis, and a limited number are seen during each clinic.   [facility]  [address] [place]ard Danker [place], [place] [phone]   Eligibility Requirements You must have lived in [place], [place], or [place] counties for at least the last three months.   You cannot be eligible for state or federal sponsored [employer], including [employer], [place], or [employer].   You generally cannot be eligible for healthcare insurance through your employer.    How to apply: Eligibility screenings are held every Tuesday and Wednesday afternoon from 1:00 pm until 4:00 pm. You do not need an appointment for the interview!  [facility] [address] [place], [place]   [employer]  [place] Department  [place]  [phone]    Behavioral Health Resources in the Community: Intensive Outpatient Programs Organization         Address  Phone  Notes  [place]. [address], [place], [place] [phone]   [facility] [address], [place], [employer]   ADS: Alcohol & Drug Svcs [address], [place], [place]   [place] [address]  [place], [place] or [phone]   Substance Abuse Resources Organization         Address  Phone  Notes  Alcohol and Drug Services  [phone]   [place]  [phone]   The [place]   Chinita Pester  [phone]   Residential & Outpatient Substance Abuse Program  [phone]   Psychological Services Organization         Address  Phone  Notes  [facility] [place]  [place]  [phone]   [place] [address] [place] [phone] or  [phone]    Mobile Crisis Teams Organization         Address  Phone  Notes  [facility], Mobile Crisis Care Unit  [phone]   [facility]  [address] [place], [place]   [place] Levels [address] [place] [phone]    Self-Help/Support Groups Organization         Address  Phone             Notes  Mental Health Assoc. of [place] - variety of support groups  [place] Call for more information  Narcotics Anonymous (NA), Caring [facility] [address], [employer] [place]  2 meetings at this location   Special educational needs teacher         Address  Phone  Notes  [facility] [place],    [place]  [phone]   [facility]  [address], [place] [address], [place], [place]   [place], [place] [phone] Admissions: 8am-3pm M-F  Incentives Substance [place] [address],    [place], [place] [phone]   [facility] [address] [place], [place], [place]   [facility] [address],  [place], [place]   [facility] Programs - Intensive Outpatient [address], [place], [place]   [facility] ([place].,  [place], [place] [phone] or [phone]   [facility] Services (RTS) [address] [place], [place] Accepts Medicaid  [facility] [place] [address]  [place] [phone] Substance Abuse/Addiction Treatment   [facility] Organization         Address  Phone  Notes  [facility]  [phone]   Domenic Schwab, PhD [address] Arlis Porta [place], [place]   [phone] or [phone]   [place] Percy [place], [place] [phone]   [facility] [address],  [place], [place] [phone] Insurance/Medicaid/sponsorship through [facility] and Families [address]                                    [place], [place] [phone] [place] [address], [place] [phone]    Dr. Adele Schilder  [phone]   [facility] Dept. 1) [address] [place] 2) [place] 3)  [place] [address], [place] [phone]  [phone]   [place] [phone] or [phone] (After Hours)

## 2013-05-12 NOTE — ED Notes (Signed)
Patient transported to CT 

## 2013-05-12 NOTE — ED Provider Notes (Signed)
CSN: [id number]     Arrival date & time 05/12/13  [id number] History   First MD Initiated Contact with Patient 05/12/13 0820     Chief Complaint  Patient presents with  . Loss of Consciousness  . Fall     (Consider location/radiation/quality/duration/timing/severity/associated sxs/prior Treatment) The history is provided by the patient.   28 year old male transgender. Currently under hormonal treatment. Patient seen on yesterday and fast track also seen on February 19 for similar complaints. Patient today had a syncopal episode he was standing felt dizzy broke out in a sweat fell down on file reportedly loss of consciousness. The back of his head not complaining of neck pain or back pain currently. Fall was witnessed by 2 friends. Patient now feels lightheaded woozy and weak. Patient is on antibiotics for empirical sinus infection. Denies any chest pain shortness of breath or abdominal pain. Denies any lower extremity or upper extremity injuries.  Past Medical History  Diagnosis Date  . Hormone replacement therapy    Past Surgical History  Procedure Laterality Date  . Cosmetic surgery      rhinoplasty   No family history on file. History  Substance Use Topics  . Smoking status: Former Smoker -- 1.00 packs/day  . Smokeless tobacco: Not on file  . Alcohol Use: Yes     Comment: Rarely    Review of Systems  Constitutional: Positive for diaphoresis and fatigue. Negative for fever.  HENT: Positive for congestion.   Eyes: Positive for visual disturbance.  Respiratory: Negative for shortness of breath.   Cardiovascular: Negative for chest pain.  Gastrointestinal: Negative for nausea, vomiting and abdominal pain.  Genitourinary: Negative for dysuria.  Musculoskeletal: Negative for back pain and neck pain.  Skin: Negative for rash.  Neurological: Positive for dizziness, syncope and light-headedness.  Hematological: Does not bruise/bleed easily.  Psychiatric/Behavioral: Negative for  confusion.      Allergies  Amoxicillin  Home Medications   Current Outpatient Rx  Name  Route  Sig  Dispense  Refill  . cetirizine (ZYRTEC) 10 MG tablet   Oral   Take 10 mg by mouth daily.         Marland Kitchen. estradiol valerate (DELESTROGEN) 40 MG/ML injection   Intramuscular   Inject 40 mg into the muscle every 14 (fourteen) days.          Marland Kitchen. estrogens, conjugated, (PREMARIN) 1.25 MG tablet   Oral   Take 1.25 mg by mouth daily.         . Multiple Vitamin (MULTIVITAMIN WITH MINERALS) TABS tablet   Oral   Take 1 tablet by mouth daily.         Marland Kitchen. SPIRONOLACTONE PO   Oral   Take 1 tablet by mouth daily.          Marland Kitchen. sulfamethoxazole-trimethoprim (BACTRIM DS) 800-160 MG per tablet   Oral   Take 1 tablet by mouth every 8 (eight) hours.         Marland Kitchen. ibuprofen (ADVIL,MOTRIN) 800 MG tablet   Oral   Take 1 tablet (800 mg total) by mouth 3 (three) times daily.   21 tablet   0    BP 122/76  Pulse 71  Temp(Src) 97.6 F (36.4 C) (Oral)  Resp 18  Ht 6\' 1"  (1.854 m)  Wt 185 lb (83.915 kg)  BMI 24.41 kg/m2  SpO2 98% Physical Exam  Nursing note and vitals reviewed. Constitutional: He is oriented to person, place, and time. He appears well-developed and well-nourished. No distress.  HENT:  Head: Normocephalic and atraumatic.  Mouth/Throat: Oropharynx is clear and moist.  Eyes: Conjunctivae and EOM are normal. Pupils are equal, round, and reactive to light.  Neck: Normal range of motion. Neck supple.  Cardiovascular: Normal rate and normal heart sounds.   Pulmonary/Chest: Effort normal and breath sounds normal.  Abdominal: Soft. Bowel sounds are normal.  Musculoskeletal: Normal range of motion. He exhibits no edema and no tenderness.  Neurological: He is alert and oriented to person, place, and time. No cranial nerve deficit. He exhibits normal muscle tone. Coordination normal.  Skin: Skin is warm. No rash noted.    ED Course  Procedures (including critical care  time) Labs Review Labs Reviewed  COMPREHENSIVE METABOLIC PANEL - Abnormal; Notable for the following:    Albumin 3.3 (*)    Total Bilirubin <0.2 (*)    GFR calc non Af Amer 87 (*)    All other components within normal limits  CBC WITH DIFFERENTIAL - Abnormal; Notable for the following:    RBC 4.21 (*)    HCT 36.3 (*)    MCHC 36.1 (*)    Platelets 138 (*)    All other components within normal limits  URINE RAPID DRUG SCREEN (HOSP PERFORMED) - Abnormal; Notable for the following:    Tetrahydrocannabinol POSITIVE (*)    All other components within normal limits  LIPASE, BLOOD  URINALYSIS, ROUTINE W REFLEX MICROSCOPIC   Results for orders placed during the hospital encounter of 05/12/13  COMPREHENSIVE METABOLIC PANEL      Result Value Ref Range   Sodium 140  137 - 147 mEq/L   Potassium 4.2  3.7 - 5.3 mEq/L   Chloride 104  96 - 112 mEq/L   CO2 26  19 - 32 mEq/L   Glucose, Bld 90  70 - 99 mg/dL   BUN 12  6 - 23 mg/dL   Creatinine, Ser 1.61  0.50 - 1.35 mg/dL   Calcium 9.3  8.4 - 09.6 mg/dL   Total Protein 7.4  6.0 - 8.3 g/dL   Albumin 3.3 (*) 3.5 - 5.2 g/dL   AST 11  0 - 37 U/L   ALT 7  0 - 53 U/L   Alkaline Phosphatase 42  39 - 117 U/L   Total Bilirubin <0.2 (*) 0.3 - 1.2 mg/dL   GFR calc non Af Amer 87 (*) >90 mL/min   GFR calc Af Amer >90  >90 mL/min  LIPASE, BLOOD      Result Value Ref Range   Lipase 16  11 - 59 U/L  CBC WITH DIFFERENTIAL      Result Value Ref Range   WBC 6.1  4.0 - 10.5 K/uL   RBC 4.21 (*) 4.22 - 5.81 MIL/uL   Hemoglobin 13.1  13.0 - 17.0 g/dL   HCT 04.5 (*) 40.9 - 81.1 %   MCV 86.2  78.0 - 100.0 fL   MCH 31.1  26.0 - 34.0 pg   MCHC 36.1 (*) 30.0 - 36.0 g/dL   RDW 91.4  78.2 - 95.6 %   Platelets 138 (*) 150 - 400 K/uL   Neutrophils Relative % 66  43 - 77 %   Neutro Abs 4.0  1.7 - 7.7 K/uL   Lymphocytes Relative 24  12 - 46 %   Lymphs Abs 1.5  0.7 - 4.0 K/uL   Monocytes Relative 9  3 - 12 %   Monocytes Absolute 0.5  0.1 - 1.0 K/uL    Eosinophils Relative  2  0 - 5 %   Eosinophils Absolute 0.1  0.0 - 0.7 K/uL   Basophils Relative 0  0 - 1 %   Basophils Absolute 0.0  0.0 - 0.1 K/uL  URINALYSIS, ROUTINE W REFLEX MICROSCOPIC      Result Value Ref Range   Color, Urine YELLOW  YELLOW   APPearance CLEAR  CLEAR   Specific Gravity, Urine 1.013  1.005 - 1.030   pH 6.5  5.0 - 8.0   Glucose, UA NEGATIVE  NEGATIVE mg/dL   Hgb urine dipstick NEGATIVE  NEGATIVE   Bilirubin Urine NEGATIVE  NEGATIVE   Ketones, ur NEGATIVE  NEGATIVE mg/dL   Protein, ur NEGATIVE  NEGATIVE mg/dL   Urobilinogen, UA 0.2  0.0 - 1.0 mg/dL   Nitrite NEGATIVE  NEGATIVE   Leukocytes, UA NEGATIVE  NEGATIVE  URINE RAPID DRUG SCREEN (HOSP PERFORMED)      Result Value Ref Range   Opiates NONE DETECTED  NONE DETECTED   Cocaine NONE DETECTED  NONE DETECTED   Benzodiazepines NONE DETECTED  NONE DETECTED   Amphetamines NONE DETECTED  NONE DETECTED   Tetrahydrocannabinol POSITIVE (*) NONE DETECTED   Barbiturates NONE DETECTED  NONE DETECTED    Imaging Review Dg Chest 2 View  05/12/2013   CLINICAL DATA:  Fall with loss of consciousness. Cough, congestion, lightheadedness, and weakness for 1 week.  EXAM: CHEST  2 VIEW  COMPARISON:  12/26/2012  FINDINGS: The cardiomediastinal silhouette is within normal limits. The lungs are well inflated and clear. There is no evidence of pleural effusion or pneumothorax. No acute osseous abnormality is identified.  IMPRESSION: No active cardiopulmonary disease.   Electronically Signed   By: Sebastian Ache   On: 05/12/2013 09:34   Ct Head Wo Contrast  05/12/2013   CLINICAL DATA:  Dizziness and fall.  EXAM: CT HEAD WITHOUT CONTRAST  TECHNIQUE: Contiguous axial images were obtained from the base of the skull through the vertex without contrast.  COMPARISON:  11/16/2009  FINDINGS: Normal appearance of the intracranial structures. No evidence for acute hemorrhage, mass lesion, midline shift, hydrocephalus or large infarct. No acute bony  abnormality. There is extensive mucosal thickening in the paranasal sinuses, particularly the ethmoid air cells. There appears to be a small amount of fluid in the mastoid air cells.  IMPRESSION: No acute intracranial abnormality.  Paranasal sinus disease.   Electronically Signed   By: Richarda Overlie M.D.   On: 05/12/2013 09:26    @NOMUSE @   Date: 05/12/2013  Rate: 64  Rhythm: normal sinus rhythm  QRS Axis: normal  Intervals: normal  ST/T Wave abnormalities: normal  Conduction Disutrbances:none  Narrative Interpretation:   Old EKG Reviewed: none available    MDM   Final diagnoses:  Syncope    Patient re\re presents for a complaint of generally not feeling well. Patient is on antibiotics for a sinus infection as of February 23. Patient re\re presents today after a syncopal episode. Patient was standing felt dizzy broke out in a sweat and fell down on the tile. Patient states he had a loss of consciousness. EMS was called. Patient hit the back of his head and woke up on his back. The event was witnessed by 2 friends. Patient now on presentation felt lightheaded woozy and weak. Extensive workup was done without any significant findings. No evidence of arrhythmia head CT negative chest x-ray negative for pneumonia or pneumothorax lab workup without evidence of leukocytosis or anemia. Urinalysis was negative. Electrolytes without significant abnormality.  Patient has no local doctor. Patient's currently undergoing transgender change from male to male. She is on hormonal treatment. That is being done by a physician in [place]. Patient given information to find a local provider. Additional workup could be done. Today's workup negative.    Shelda Jakes, MD 05/12/13 [phone]

## 2013-05-13 NOTE — ED Provider Notes (Signed)
Medical screening examination/treatment/procedure(s) were performed by non-physician practitioner and as supervising physician I was immediately available for consultation/collaboration.   EKG Interpretation None        Candyce ChurnJohn David Sagan Maselli III, MD 05/13/13 1014

## 2013-06-14 ENCOUNTER — Encounter (HOSPITAL_COMMUNITY): Payer: Self-pay | Admitting: Emergency Medicine

## 2013-06-14 ENCOUNTER — Emergency Department (INDEPENDENT_AMBULATORY_CARE_PROVIDER_SITE_OTHER)
Admission: EM | Admit: 2013-06-14 | Discharge: 2013-06-14 | Disposition: A | Discharge status: Home / Self Care | Payer: Self-pay | Source: Home / Self Care | Attending: Family Medicine | Admitting: Family Medicine

## 2013-06-14 DIAGNOSIS — J309 Allergic rhinitis, unspecified: Secondary | ICD-10-CM

## 2013-06-14 DIAGNOSIS — J302 Other seasonal allergic rhinitis: Secondary | ICD-10-CM

## 2013-06-14 MED ORDER — FLUTICASONE PROPIONATE 50 MCG/ACT NA SUSP: 1.0000 | Freq: Two times a day (BID) | NASAL | Status: DC

## 2013-06-14 MED ORDER — METHYLPREDNISOLONE ACETATE 40 MG/ML IJ SUSP: 80.0000 mg | Freq: Once | INTRAMUSCULAR | Status: DC

## 2013-06-14 MED ORDER — TRIAMCINOLONE ACETONIDE 40 MG/ML IJ SUSP: 40.0000 mg | Freq: Once | INTRAMUSCULAR | Status: DC

## 2013-06-14 MED ORDER — METHYLPREDNISOLONE ACETATE 80 MG/ML IJ SUSP
INTRAMUSCULAR | Status: AC
  Filled 2013-06-14: qty 1

## 2013-06-14 MED ORDER — FEXOFENADINE HCL 180 MG PO TABS: 180.0000 mg | ORAL_TABLET | Freq: Every day | ORAL | Status: DC

## 2013-06-14 MED ORDER — TRIAMCINOLONE ACETONIDE 40 MG/ML IJ SUSP
INTRAMUSCULAR | Status: AC
  Filled 2013-06-14: qty 1

## 2013-06-14 NOTE — ED Notes (Signed)
C/o onset yesterday of nasal  congestion, red runny eyes ; minimal relief w OTC medications and fluticasone

## 2013-06-14 NOTE — ED Provider Notes (Addendum)
CSN: [id number]     Arrival date & time 06/14/13  1713 History   None    Chief Complaint  Patient presents with  . Allergies   (Consider location/radiation/quality/duration/timing/severity/associated sxs/prior Treatment) Patient is a 28 y.o. male presenting with URI. The history is provided by the patient.  URI Presenting symptoms: congestion, cough and rhinorrhea   Presenting symptoms: no fever   Severity:  Mild Onset quality:  Gradual Duration:  2 days Progression:  Unchanged Chronicity:  New Associated symptoms: sneezing   Associated symptoms: no sinus pain and no wheezing     Past Medical History  Diagnosis Date  . Hormone replacement therapy    Past Surgical History  Procedure Laterality Date  . Cosmetic surgery      rhinoplasty   History reviewed. No pertinent family history. History  Substance Use Topics  . Smoking status: Former Smoker -- 1.00 packs/day  . Smokeless tobacco: Not on file  . Alcohol Use: Yes     Comment: Rarely    Review of Systems  Constitutional: Negative.  Negative for fever.  HENT: Positive for congestion, rhinorrhea and sneezing.   Eyes: Positive for itching.  Respiratory: Positive for cough. Negative for wheezing.     Allergies  Amoxicillin  Home Medications   Current Outpatient Rx  Name  Route  Sig  Dispense  Refill  . cetirizine (ZYRTEC) 10 MG tablet   Oral   Take 10 mg by mouth daily.         Marland Kitchen estradiol valerate (DELESTROGEN) 40 MG/ML injection   Intramuscular   Inject 40 mg into the muscle every 14 (fourteen) days.          Marland Kitchen estrogens, conjugated, (PREMARIN) 1.25 MG tablet   Oral   Take 1.25 mg by mouth daily.         . fexofenadine (ALLEGRA) 180 MG tablet   Oral   Take 1 tablet (180 mg total) by mouth daily.   30 tablet   1   . fluticasone (FLONASE) 50 MCG/ACT nasal spray   Each Nare   Place 1 spray into both nostrils 2 (two) times daily.   1 g   2   . ibuprofen (ADVIL,MOTRIN) 800 MG tablet   Oral   Take 1 tablet (800 mg total) by mouth 3 (three) times daily.   21 tablet   0   . Multiple Vitamin (MULTIVITAMIN WITH MINERALS) TABS tablet   Oral   Take 1 tablet by mouth daily.         Marland Kitchen SPIRONOLACTONE PO   Oral   Take 1 tablet by mouth daily.          Marland Kitchen sulfamethoxazole-trimethoprim (BACTRIM DS) 800-160 MG per tablet   Oral   Take 1 tablet by mouth every 8 (eight) hours.          BP 131/66  Pulse 73  Temp(Src) 98.1 F (36.7 C) (Oral)  Resp 14  SpO2 98% Physical Exam  Nursing note and vitals reviewed. Constitutional: He is oriented to person, place, and time. He appears well-developed and well-nourished.  HENT:  Head: Normocephalic.  Right Ear: External ear normal.  Left Ear: External ear normal.  Mouth/Throat: Oropharynx is clear and moist.  Eyes: Conjunctivae are normal. Pupils are equal, round, and reactive to light.  Neck: Normal range of motion. Neck supple.  Pulmonary/Chest: Effort normal and breath sounds normal.  Lymphadenopathy:    He has no cervical adenopathy.  Neurological: He is alert and oriented to  person, place, and time.  Skin: Skin is warm and dry.    ED Course  Procedures (including critical care time) Labs Review Labs Reviewed - No data to display Imaging Review No results found.   MDM   1. Seasonal allergic rhinitis        Linna HoffJames D Kindl, MD 06/14/13 1811  Linna HoffJames D Kindl, MD 06/14/13 [phone]

## 2013-08-14 ENCOUNTER — Encounter (HOSPITAL_COMMUNITY): Payer: Self-pay | Admitting: Emergency Medicine

## 2013-08-14 ENCOUNTER — Emergency Department (INDEPENDENT_AMBULATORY_CARE_PROVIDER_SITE_OTHER)
Admission: EM | Admit: 2013-08-14 | Discharge: 2013-08-14 | Disposition: A | Discharge status: Home / Self Care | Payer: Self-pay | Source: Home / Self Care | Attending: Family Medicine | Admitting: Family Medicine

## 2013-08-14 DIAGNOSIS — K1120 Sialoadenitis, unspecified: Secondary | ICD-10-CM

## 2013-08-14 DIAGNOSIS — K1121 Acute sialoadenitis: Secondary | ICD-10-CM

## 2013-08-14 HISTORY — DX: Transsexualism: F64.0

## 2013-08-14 MED ORDER — AMOXICILLIN 500 MG PO CAPS: 500.0000 mg | ORAL_CAPSULE | Freq: Three times a day (TID) | ORAL | Status: DC

## 2013-08-14 NOTE — Discharge Instructions (Signed)
Try lemon or lime candy, take all of antibiotic, see ent doctor if further problems.

## 2013-08-14 NOTE — ED Notes (Signed)
Facial swelling

## 2013-08-14 NOTE — ED Provider Notes (Signed)
CSN: [id number]     Arrival date & time 08/14/13  1349 History   First MD Initiated Contact with Patient 08/14/13 1457     Chief Complaint  Patient presents with  . Facial Swelling   (Consider location/radiation/quality/duration/timing/severity/associated sxs/prior Treatment) Patient is a 28 y.o. male presenting with general illness. The history is provided by the patient.  Illness Location:  Right facial swelling for 5d, improving,  Severity:  Mild Onset quality:  Gradual Duration:  5 days Progression:  Improving Chronicity:  New Context:  No dental problems. Associated symptoms: no fever, no headaches, no rhinorrhea and no sore throat     Past Medical History  Diagnosis Date  . Hormone replacement therapy   . Transexualism    Past Surgical History  Procedure Laterality Date  . Cosmetic surgery      rhinoplasty   History reviewed. No pertinent family history. History  Substance Use Topics  . Smoking status: Former Smoker -- 1.00 packs/day  . Smokeless tobacco: Not on file  . Alcohol Use: Yes     Comment: Rarely    Review of Systems  Constitutional: Negative.  Negative for fever.  HENT: Positive for facial swelling. Negative for dental problem, rhinorrhea, sinus pressure, sore throat and trouble swallowing.   Neurological: Negative for headaches.    Allergies  Review of patient's allergies indicates no active allergies.  Home Medications   Prior to Admission medications   Medication Sig Start Date End Date Taking? Authorizing Provider  amoxicillin (AMOXIL) 500 MG capsule Take 1 capsule (500 mg total) by mouth 3 (three) times daily. 08/14/13   Linna HoffJames D Owen Pratte, MD  cetirizine (ZYRTEC) 10 MG tablet Take 10 mg by mouth daily.    Historical Provider, MD  estradiol valerate (DELESTROGEN) 40 MG/ML injection Inject 40 mg into the muscle every 14 (fourteen) days.     Historical Provider, MD  estrogens, conjugated, (PREMARIN) 1.25 MG tablet Take 1.25 mg by mouth daily.     Historical Provider, MD  fexofenadine (ALLEGRA) 180 MG tablet Take 1 tablet (180 mg total) by mouth daily. 06/14/13   Linna HoffJames D Staisha Winiarski, MD  fluticasone (FLONASE) 50 MCG/ACT nasal spray Place 1 spray into both nostrils 2 (two) times daily. 06/14/13   Linna HoffJames D Cortland Crehan, MD  ibuprofen (ADVIL,MOTRIN) 800 MG tablet Take 1 tablet (800 mg total) by mouth 3 (three) times daily. 05/12/13   Vanetta MuldersScott Zackowski, MD  Multiple Vitamin (MULTIVITAMIN WITH MINERALS) TABS tablet Take 1 tablet by mouth daily.    Historical Provider, MD  SPIRONOLACTONE PO Take 1 tablet by mouth daily.     Historical Provider, MD  sulfamethoxazole-trimethoprim (BACTRIM DS) 800-160 MG per tablet Take 1 tablet by mouth every 8 (eight) hours.    Historical Provider, MD   BP 140/89  Pulse 72  Temp(Src) 98.6 F (37 C) (Oral)  Resp 16  SpO2 99% Physical Exam  Nursing note and vitals reviewed. Constitutional: He is oriented to person, place, and time. He appears well-developed and well-nourished.  HENT:  Head: Normocephalic.  Right Ear: External ear normal.  Left Ear: External ear normal.  Mouth/Throat: Oropharynx is clear and moist.  Very minimal sts of right cheek, no adenopathy at present , no intraoral lesions or pain, teeth intact.  Eyes: Conjunctivae are normal. Pupils are equal, round, and reactive to light.  Neck: Normal range of motion. Neck supple.  Lymphadenopathy:    He has no cervical adenopathy.  Neurological: He is alert and oriented to person, place, and time.  Skin: Skin is warm and dry.    ED Course  Procedures (including critical care time) Labs Review Labs Reviewed - No data to display  Imaging Review No results found.   MDM   1. Acute parotitis        Linna Hoff, MD 08/14/13 [phone]

## 2013-10-08 ENCOUNTER — Encounter (HOSPITAL_COMMUNITY): Payer: Self-pay | Admitting: Emergency Medicine

## 2013-10-08 ENCOUNTER — Emergency Department (HOSPITAL_COMMUNITY)
Admission: EM | Admit: 2013-10-08 | Discharge: 2013-10-10 | Disposition: A | Discharge status: Home / Self Care | Payer: Self-pay | Attending: Emergency Medicine | Admitting: Emergency Medicine

## 2013-10-08 DIAGNOSIS — T394X2A Poisoning by antirheumatics, not elsewhere classified, intentional self-harm, initial encounter: Secondary | ICD-10-CM | POA: Insufficient documentation

## 2013-10-08 DIAGNOSIS — F603 Borderline personality disorder: Secondary | ICD-10-CM | POA: Diagnosis present

## 2013-10-08 DIAGNOSIS — F1910 Other psychoactive substance abuse, uncomplicated: Secondary | ICD-10-CM | POA: Diagnosis present

## 2013-10-08 DIAGNOSIS — Z87891 Personal history of nicotine dependence: Secondary | ICD-10-CM | POA: Insufficient documentation

## 2013-10-08 DIAGNOSIS — F640 Transsexualism: Secondary | ICD-10-CM | POA: Insufficient documentation

## 2013-10-08 DIAGNOSIS — Z79899 Other long term (current) drug therapy: Secondary | ICD-10-CM | POA: Insufficient documentation

## 2013-10-08 DIAGNOSIS — Z880 Allergy status to penicillin: Secondary | ICD-10-CM | POA: Insufficient documentation

## 2013-10-08 DIAGNOSIS — T39094A Poisoning by salicylates, undetermined, initial encounter: Secondary | ICD-10-CM | POA: Insufficient documentation

## 2013-10-08 DIAGNOSIS — R45851 Suicidal ideations: Secondary | ICD-10-CM | POA: Insufficient documentation

## 2013-10-08 DIAGNOSIS — F4325 Adjustment disorder with mixed disturbance of emotions and conduct: Secondary | ICD-10-CM | POA: Diagnosis present

## 2013-10-08 DIAGNOSIS — T398X2A Poisoning by other nonopioid analgesics and antipyretics, not elsewhere classified, intentional self-harm, initial encounter: Secondary | ICD-10-CM

## 2013-10-08 LAB — RAPID URINE DRUG SCREEN, HOSP PERFORMED
AMPHETAMINES: NOT DETECTED
BENZODIAZEPINES: NOT DETECTED
Barbiturates: NOT DETECTED
COCAINE: NOT DETECTED
Opiates: NOT DETECTED
Tetrahydrocannabinol: POSITIVE — AB

## 2013-10-08 LAB — COMPREHENSIVE METABOLIC PANEL
ALK PHOS: 32 U/L — AB (ref 39–117)
ALT: 9 U/L (ref 0–53)
AST: 15 U/L (ref 0–37)
Albumin: 3.7 g/dL (ref 3.5–5.2)
Anion gap: 14 (ref 5–15)
BILIRUBIN TOTAL: 0.5 mg/dL (ref 0.3–1.2)
BUN: 6 mg/dL (ref 6–23)
CHLORIDE: 100 meq/L (ref 96–112)
CO2: 26 meq/L (ref 19–32)
Calcium: 9 mg/dL (ref 8.4–10.5)
Creatinine, Ser: 0.89 mg/dL (ref 0.50–1.35)
GFR calc non Af Amer: >90 mL/min (ref >90)
GLUCOSE: 130 mg/dL — AB (ref 70–99)
Potassium: 3.6 mEq/L — ABNORMAL LOW (ref 3.7–5.3)
Sodium: 140 mEq/L (ref 137–147)
Total Protein: 7.5 g/dL (ref 6.0–8.3)

## 2013-10-08 LAB — CBC
HCT: 38.5 % — ABNORMAL LOW (ref 39.0–52.0)
HEMOGLOBIN: 13.5 g/dL (ref 13.0–17.0)
MCH: 30.7 pg (ref 26.0–34.0)
MCHC: 35.1 g/dL (ref 30.0–36.0)
MCV: 87.5 fL (ref 78.0–100.0)
Platelets: 178 10*3/uL (ref 150–400)
RBC: 4.4 MIL/uL (ref 4.22–5.81)
RDW: 12.8 % (ref 11.5–15.5)
WBC: 8.9 10*3/uL (ref 4.0–10.5)

## 2013-10-08 LAB — ETHANOL: Alcohol, Ethyl (B): <11 mg/dL (ref 0–11)

## 2013-10-08 LAB — CBG MONITORING, ED: GLUCOSE-CAPILLARY: 126 mg/dL — AB (ref 70–99)

## 2013-10-08 LAB — SALICYLATE LEVEL: Salicylate Lvl: <2 mg/dL — ABNORMAL LOW (ref 2.8–20.0)

## 2013-10-08 LAB — ACETAMINOPHEN LEVEL: Acetaminophen (Tylenol), Serum: 23.7 ug/mL (ref 10–30)

## 2013-10-08 MED ORDER — ZOLPIDEM TARTRATE 5 MG PO TABS: 5.0000 mg | ORAL_TABLET | Freq: Every evening | ORAL | Status: DC | PRN

## 2013-10-08 MED ORDER — ESTRADIOL 1 MG PO TABS
1.0000 mg | ORAL_TABLET | Freq: Every day | ORAL | Status: DC
  Administered 2013-10-09 – 2013-10-10 (×2): 1 mg via ORAL
  Filled 2013-10-08 (×2): qty 1

## 2013-10-08 MED ORDER — ONDANSETRON HCL 4 MG PO TABS: 4.0000 mg | ORAL_TABLET | Freq: Three times a day (TID) | ORAL | Status: DC | PRN

## 2013-10-08 MED ORDER — ALUM & MAG HYDROXIDE-SIMETH 200-200-20 MG/5ML PO SUSP: 30.0000 mL | ORAL | Status: DC | PRN

## 2013-10-08 MED ORDER — ACETAMINOPHEN 325 MG PO TABS
650.0000 mg | ORAL_TABLET | ORAL | Status: DC | PRN
  Administered 2013-10-10: 650 mg via ORAL
  Filled 2013-10-08: qty 2

## 2013-10-08 MED ORDER — IBUPROFEN 400 MG PO TABS
600.0000 mg | ORAL_TABLET | Freq: Three times a day (TID) | ORAL | Status: DC | PRN
  Filled 2013-10-08 (×2): qty 1

## 2013-10-08 MED ORDER — ZIPRASIDONE MESYLATE 20 MG IM SOLR
10.0000 mg | Freq: Once | INTRAMUSCULAR | Status: AC
  Administered 2013-10-09: 10 mg via INTRAMUSCULAR
  Filled 2013-10-08: qty 20

## 2013-10-08 MED ORDER — STERILE WATER FOR INJECTION IJ SOLN
INTRAMUSCULAR | Status: AC
  Administered 2013-10-09: 10 mL
  Filled 2013-10-08: qty 10

## 2013-10-08 MED ORDER — LORAZEPAM 2 MG/ML IJ SOLN
2.0000 mg | Freq: Once | INTRAMUSCULAR | Status: DC
  Filled 2013-10-08: qty 1

## 2013-10-08 MED ORDER — ESTROGENS CONJUGATED 1.25 MG PO TABS
1.2500 mg | ORAL_TABLET | Freq: Every day | ORAL | Status: DC
  Administered 2013-10-09 – 2013-10-10 (×2): 1.25 mg via ORAL
  Filled 2013-10-08 (×2): qty 1

## 2013-10-08 MED ORDER — LORAZEPAM 1 MG PO TABS: 1.0000 mg | ORAL_TABLET | Freq: Three times a day (TID) | ORAL | Status: DC | PRN

## 2013-10-08 MED ORDER — STERILE WATER FOR INJECTION IJ SOLN
INTRAMUSCULAR | Status: AC
  Filled 2013-10-08: qty 10

## 2013-10-08 MED ORDER — NICOTINE 21 MG/24HR TD PT24: 21.0000 mg | MEDICATED_PATCH | Freq: Every day | TRANSDERMAL | Status: DC

## 2013-10-08 MED ORDER — SODIUM CHLORIDE 0.9 % IV BOLUS (SEPSIS)
1000.0000 mL | Freq: Once | INTRAVENOUS | Status: AC
  Administered 2013-10-08: 1000 mL via INTRAVENOUS

## 2013-10-08 NOTE — ED Provider Notes (Signed)
CSN: [id number]     Arrival date & time 10/08/13  1943 History   First MD Initiated Contact with Patient 10/08/13 2000     Chief Complaint  Patient presents with  . Drug Overdose     (Consider location/radiation/quality/duration/timing/severity/associated sxs/prior Treatment) Patient is a 28 y.o. male presenting with Overdose. The history is provided by the patient.  Drug Overdose This is a new problem. The current episode started 1 to 2 hours ago. The problem occurs constantly. The problem has not changed since onset.Pertinent negatives include no abdominal pain and no shortness of breath. Nothing aggravates the symptoms. Nothing relieves the symptoms.    Past Medical History  Diagnosis Date  . Hormone replacement therapy   . Transexualism    Past Surgical History  Procedure Laterality Date  . Cosmetic surgery      rhinoplasty   History reviewed. No pertinent family history. History  Substance Use Topics  . Smoking status: Former Smoker -- 1.00 packs/day  . Smokeless tobacco: Not on file  . Alcohol Use: Yes     Comment: Rarely    Review of Systems  Constitutional: Negative for fever.  Respiratory: Negative for cough and shortness of breath.   Gastrointestinal: Negative for vomiting and abdominal pain.  All other systems reviewed and are negative.     Allergies  Amoxicillin  Home Medications   Prior to Admission medications   Medication Sig Start Date End Date Taking? Authorizing Provider  estradiol (ESTRACE) 1 MG tablet Take 1 mg by mouth daily.   Yes Historical Provider, MD  estrogens, conjugated, (PREMARIN) 1.25 MG tablet Take 1.25 mg by mouth daily.   Yes Historical Provider, MD  ASPIRIN PO Take by mouth once.    Historical Provider, MD  Phenyleph-Doxylamine-DM-APAP (COLD & FLU NIGHTTIME/DAYTIME PO) Take by mouth once.    Historical Provider, MD   BP 125/82  Pulse 55  Resp 20  SpO2 100% Physical Exam  Nursing note and vitals reviewed. Constitutional:  He is oriented to person, place, and time. He appears well-developed and well-nourished. No distress.  HENT:  Head: Normocephalic and atraumatic.  Mouth/Throat: Oropharynx is clear and moist. No oropharyngeal exudate.  Eyes: EOM are normal. Pupils are equal, round, and reactive to light.  Neck: Normal range of motion. Neck supple.  Cardiovascular: Normal rate and regular rhythm.  Exam reveals no friction rub.   No murmur heard. Pulmonary/Chest: Effort normal and breath sounds normal. No respiratory distress. He has no wheezes. He has no rales.  Abdominal: Soft. He exhibits no distension. There is no tenderness. There is no rebound.  Musculoskeletal: Normal range of motion. He exhibits no edema.  Neurological: He is alert and oriented to person, place, and time. No cranial nerve deficit. He exhibits normal muscle tone. Coordination normal.  Skin: No rash noted. He is not diaphoretic.  Psychiatric: He exhibits a depressed mood.    ED Course  Procedures (including critical care time) Labs Review Labs Reviewed  CBC - Abnormal; Notable for the following:    HCT 38.5 (*)    All other components within normal limits  CBG MONITORING, ED - Abnormal; Notable for the following:    Glucose-Capillary 126 (*)    All other components within normal limits  COMPREHENSIVE METABOLIC PANEL  ETHANOL  ACETAMINOPHEN LEVEL  SALICYLATE LEVEL  URINE RAPID DRUG SCREEN (HOSP PERFORMED)    Imaging Review No results found.   EKG Interpretation   Date/Time:  Sunday October 08 2013 19:49:44 EDT Ventricular Rate:  63 PR Interval:  184 QRS Duration: 94 QT Interval:  422 QTC Calculation: 431 R Axis:   12 Text Interpretation:  Normal sinus rhythm Possible Left atrial enlargement  Similar to prior Confirmed by Gwendolyn GrantWALDEN  MD, Kymani Laursen ([id number]) on 10/08/2013  8:01:46 PM      MDM   Final diagnoses:  Suicide attempt    Fransisca ConnorsMichael Manter is a 28 year old transsexual male to male (preferred name [name]) who  presents with drug overdose. Reported to triage nurse suicide intent. Took about 5 aspirin and around 10 or 12 "nighttime" pills a little over an hour ago. Patient would not report suicidality to me initially, bone we talked she stated she was depressed and had thoughts of suicide before this is the first time she ever acted on it. No history of medications for depression, no history of suicide attempt. Will check labs, EKG, consult TTS. TTS recommended admission, IVC. IVC taken out by me. Initial salicylate normal, repeat ordered.  Sedated because she ran down the hallway trying to escape and wouldn't give her phone to us.    Dagmar HaitWilliam Tomi Grandpre, MD 10/09/13 [phone]
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
## 2013-10-08 NOTE — BH Assessment (Signed)
Received a call for a tele-assessment. Spoke with Dr. Gwendolyn GrantWalden who reported that pt prefers to be called Frederick Hess. Pt took an overdose but denies SI to him and refuses to discuss recent overdose.  Pt has endorsed SI to the triage nurse. Pt has a history of depression. Assessment will be initiated.

## 2013-10-08 NOTE — ED Notes (Addendum)
Presents with drug overdose of unknown amount of Aspirin and Night and Day medication. Admits intentional SI attempt. Feels jittery and dehydrated and faint. Pt is tearful. Alert, not wanting to speak much. Is cooperative. Pt goes by the name Frederick Hess

## 2013-10-08 NOTE — BH Assessment (Signed)
Assessment completed. Consulted with Alberteen SamFran Hobson, NP who recommended inpatient treatment. [facility] at capacity. TTS will seek placement at other facilities.  Dr. Gwendolyn GrantWalden has been notified of the recommendation.

## 2013-10-08 NOTE — ED Notes (Signed)
Lawanna KobusAngel, [facility] notified of BH sitter need.

## 2013-10-09 LAB — ACETAMINOPHEN LEVEL

## 2013-10-09 LAB — SALICYLATE LEVEL

## 2013-10-09 MED ORDER — ZIPRASIDONE MESYLATE 20 MG IM SOLR
10.0000 mg | Freq: Once | INTRAMUSCULAR | Status: AC
  Administered 2013-10-09: 10 mg via INTRAMUSCULAR

## 2013-10-09 MED ORDER — LORAZEPAM 2 MG/ML IJ SOLN
1.0000 mg | Freq: Once | INTRAMUSCULAR | Status: AC
  Administered 2013-10-09: 1 mg via INTRAVENOUS

## 2013-10-09 NOTE — ED Notes (Signed)
Patient doesn't want "private conversation information released to her mother".
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
## 2013-10-09 NOTE — ED Notes (Signed)
[facility] advised that patient needs a psych assessment when patient wakens.

## 2013-10-09 NOTE — BH Assessment (Signed)
[facility] Assessment Progress Note  After consulting with Claudette Headonrad Withrow, NP is has been determined that [facility].  At 16:42 I spoke to [place] at [facility], who authorized pt for 10/09/13 - 10/15/13, authorization # [id number].  Please note that authorization does not mean that pt has been [facility] to [facility].  At 16:47 I spoke to [facility] who took demographic information and invited me to fax referral.  After faxing documents I called again and spoke to [place], who confirmed receipt of referral documents.  Decision is pending at this time.  Doylene Canninghomas Lititia Sen, MA Triage Specialist 10/09/2013 @ 17:34

## 2013-10-09 NOTE — ED Notes (Signed)
Security escorted 3 visitors back to patient room.   I advised only 2 could stay.   One did go back out to the waiting room.

## 2013-10-09 NOTE — ED Notes (Signed)
Ankle Restraints removed. Advised Pt. If she stayed calm and cooperative her wrist restraints could be removed next.

## 2013-10-09 NOTE — ED Notes (Signed)
Wrist restraints removed at this time.
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
## 2013-10-09 NOTE — ED Notes (Addendum)
Patient was to get medications after she eats.  But was asleep when I returned to lunch.  Because they are daily medications, will give to patient when she wakes up.

## 2013-10-09 NOTE — Progress Notes (Signed)
[facility]: Referral Faxed @0215  Other facilities are at capacity  Deatra JamesKeDra Cathey, MHT Disposition Tech

## 2013-10-10 DIAGNOSIS — R45851 Suicidal ideations: Secondary | ICD-10-CM

## 2013-10-10 DIAGNOSIS — F603 Borderline personality disorder: Secondary | ICD-10-CM | POA: Diagnosis present

## 2013-10-10 DIAGNOSIS — F4325 Adjustment disorder with mixed disturbance of emotions and conduct: Secondary | ICD-10-CM

## 2013-10-10 DIAGNOSIS — F1910 Other psychoactive substance abuse, uncomplicated: Secondary | ICD-10-CM
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
## 2013-10-10 NOTE — ED Notes (Signed)
Pt continues to denie any thoughts of wanting to harm self or others. Denies any hallucinations.

## 2013-10-10 NOTE — BH Assessment (Signed)
[facility] Assessment Progress Note  At 13:12 Jun[facility]Connie [facility], leaving a message on my voice mail.  Pt accepted to their wait list.  Doylene Canninghomas August Gosser, MA Triage Specialist 10/10/2013 @ 13:21

## 2013-10-10 NOTE — Progress Notes (Signed)
ED CM met with patient at bedside to discuss establishing care with PCP.  Pt confirms not having a PCP or insurance. Patient reports being unemployed at present. Discussed the [facility] and the services provided. Pt is agreeable to  establish care at the [facility].  Brochure was given with contact information  address and phone number. Pt verbalized understanding and appreciation for the assistance. Instructed patient to walk in to schedule follow-up appt, any morning Mon-Friday,  Pt verbalized understanding teach back done.  Dr. Tawnya Crook and Danae Chen RN  updated. No further ED CM needs identified.

## 2013-10-10 NOTE — ED Provider Notes (Signed)
TTS team has evaluated pt who was discussed with the psychiatry,Dr. Lucianne MussKumar who does not feel she meets inpt criteria. She denies SI or suicide attempt. Will d/c home.  IVC revoked.  [facility]   1. Passive suicidal ideations      Frederick CiscoMegan E Docherty, MD 10/12/13 [phone]

## 2013-10-10 NOTE — Progress Notes (Signed)
CSW spoke with pt in regards to disposition. Pt is tearful and reporting that he wants to be discharged. Pt reports that he was not attempting to harm himself but he wanted to go to sleep. CSW inquired of pt if that meant he was trying to attempt suicide. Pt reported that he is a [place] and would never cause harm to himself. Pt reports that he was feeling overwhelmed because of life circumstances and he has allowed a homeless family to move in with him. CSW shared with pt that his mother was in the lobby. Pt gave CSW permission to speak with his mother to share disposition. Pt reports that he is no longer suicidal and would like to be released. CSW shared this information with the EDP who [facility] Pt awaiting disposition.   Howard PouchDoris Rosaria Kubin,LCSWA [phone]

## 2013-10-10 NOTE — Discharge Instructions (Signed)
No-harm Safety Contract  °A no-harm safety contract is a written or verbal agreement between you and a mental health professional to promote safety. It contains specific actions and promises you agree to. The agreement also includes instructions from the therapist or doctor. The instructions will help prevent you from harming yourself or harming others. Harm can be as mild as pinching yourself, but can increase in intensity to actions like burning or cutting yourself. The extreme level of self-harm would be committing suicide. No-harm safety contracts are also sometimes referred to as a no-suicide contract, suicide prevention contract, no-harm agreements or decisions, or a safety contract.  °REASONS FOR NO-HARM SAFETY CONTRACTS °Safety contracts are just one part of an overall treatment plan to help keep you safe and free of harm. A safety contract may help to relieve anxiety, restore a sense of control, state clearly the alternatives to harm or suicide, and give you and your therapist or doctor a gauge for how you are doing in between visits. °Many factors impact the decision to use a no-harm safety contract and its effectiveness. A proper overall treatment plan and evaluation and good patient understanding are the keys to good outcomes. °CONTRACT ELEMENTS  °A contract can range from simple to complex. They include all or some of the following:  °Action statements. These are statements you agree to do or not do. °Example: If I feel my life is becoming too difficult, I agree to do the following so there is no harm to myself or others: °· Talk with family or friends. °· Rid myself of all things that I could use to harm myself. °· Do an activity I enjoy or have enjoyed in the recent past. °Coping strategies. These are ways to think and feel that decrease stress, such as: °· Use of affirmations or positive statements about self. °· Good self-care, including improved grooming, and healthy eating, and healthy sleeping  patterns. °· Increase physical exercise. °· Increase social involvement. °· Focus on positive aspects of life. °Crisis management. This would include what to do if there was trouble following the contract or an urge to harm. This might include notifying family or your therapist of suicidal thoughts. Be open and honest about suicidal urges. To prevent a crisis, do the following: °· List reasons to reach out for support. °· Keep contact numbers and available hours handy. °Treatment goals. These are goals would include no suicidal thoughts, improved mood, and feelings of hopefulness. °Listed responsibilities of different people involved in care. This could include family members. A family member may agree to remove firearms or other lethal weapons/substances from your ease of access. °A timeline. A timeline can be in place from one therapy session to the next session. °HOME CARE INSTRUCTIONS  °· Follow your no-harm safety contract. °· Contact your therapist and/or doctor if you have any questions or concerns. °MAKE SURE YOU:  °· Understand these instructions. °· Will watch your condition. Noticing any mood changes or suicidal urges. °· Will get help right away if you are not doing well or get worse. °Document Released: 08/20/2009 Document Revised: 05/25/2011 Document Reviewed: 08/20/2009 °ExitCare® Patient Information ©2015 [employer]. This information is not intended to replace advice given to you by your health care provider. Make sure you discuss any questions you have with your health care provider. ° ° °Emergency Department Resource Guide °1) Find a Doctor and Pay Out of Pocket °Although you won't have to find out who is covered by your insurance plan, it is   a good idea to ask around and get recommendations. You will then need to call the office and see if the doctor you have chosen will accept you as a new patient and what types of options they offer for patients who are self-pay. Some doctors offer discounts  or will set up payment plans for their patients who do not have insurance, but you will need to ask so you aren't surprised when you get to your appointment. ° °2) Contact Your Local Health Department °Not all health departments have doctors that can see patients for sick visits, but many do, so it is worth a call to see if yours does. If you don't know where your local health department is, you can check in your phone book. The CDC also has a tool to help you locate your state's health department, and many state websites also have listings of all of their local health departments. ° °3) Find a Walk-in Clinic °If your illness is not likely to be very severe or complicated, you may want to try a walk in clinic. These are popping up all over the country in pharmacies, drugstores, and shopping centers. They're usually staffed by nurse practitioners or physician assistants that have been trained to treat common illnesses and complaints. They're usually fairly quick and inexpensive. However, if you have serious medical issues or chronic medical problems, these are probably not your best option. ° °No Primary Care Doctor: °- Call [facility] at  832-8000 - they can help you locate a primary care doctor that  accepts your insurance, provides certain services, etc. °- Physician Referral Service- 1-800-533-3463 ° °Chronic Pain Problems: °Organization         Address  Phone   Notes  °[facility]  (336) 297-2271 Patients need to be referred by their primary care doctor.  ° °Medication Assistance: °Organization         Address  Phone   Notes  °[place] [facility] [address] °[place], [place] [address] (336) 641-8030 --Must be a resident of [place] °-- Must have NO insurance coverage whatsoever (no Medicaid/ Medicare, etc.) °-- The pt. MUST have a primary care doctor that directs their care regularly and follows them in the community °  °[facility]  (866)  331-1348   °[employer]  (888) 892-1162   ° °Agencies that provide inexpensive medical care: °Organization         Address  Phone   Notes  °[facility]  (336) 832-8035   °[facility]    (336) 832-7272   °[facility] [address] °[place], [place] [address] (336) 832-4777   °[facility] [address], °[employer] (336) 271-4999   °[facility]    (336) 373-0678   °[facility]    (336) 272-1050   °[facility] ° [address], [place] Phone:  (336) 832-4444, Fax:  (336) 832-4440 Hours of Operation:  9 am - 6 pm, M-F.  Also accepts Medicaid/Medicare and self-pay.  °[facility] ° [address],  Phone: (336) 832-3150, Fax: (336) 832-3151. Hours of Operation:  8:30 am - 5:30 pm, M-F.  Also accepts Medicaid and self-pay.  °[facility] [place] [address], [place] Phone: (336) 878-6027   °[facility] [address], [place], [place] (336)723-1848, Ext. 123 Mondays & Thursdays: 7-9 AM.    First 15 patients are seen on a first come, first serve basis. °  ° °Medicaid-accepting [place] Providers: ° °Organization         Address  Phone   Notes  °[facility] [address], [place] (336) 641-2100 Also accepts self-pay patients.  °[facility] [address], [place] ° (336) 856-9996   °[facility] [address], [place] (336) 288-8857   °[facility] [address] [place] [address], [place] (336) 299-7000   °Veita Bland [address], [place]  ° (336) 373-1557 Only accepts [place] Access Medicaid patients after they have their name applied to their card.  ° °Self-Pay (no insurance) in [place]: ° °Organization         Address  Phone   Notes  °Sickle Cell Patients, [facility] [address], [place] (336) 832-1970   °[facility] [address], [place] (336) 832-4400   ° [facility] ° [address], [place] (336) 992-4800   °[facility]/Dr. Osei-Bonsu ° [address] [place] [address], [place] or [address], [place] (336) 841-8500 Phone number for both [place] and [place] locations is the same.  °[facility] [address], [place] (336) 299-0000   °[facility] [address] [place] [address], [place] or [address] (336) 852-7530 °(336) 878-2260   °[facility] [address], [place] (336) 350-1642, phone; (336) 294-5005, fax Sees patients 1st and 3rd Saturday of every month.  Must not qualify for public or private insurance (i.e. Medicaid, Medicare, [place] Health Choice, Veterans' Benefits) • Household income should be no more than 200% of the poverty level •The clinic cannot treat you if you are pregnant or think you are pregnant • Sexually transmitted diseases are not treated at the clinic.  ° ° °Dental Care: °Organization         Address  Phone  Notes  °[facility] [address], [place] (336) 641-6152 Accepts children up to age 21 who are enrolled in Medicaid or [place] Health Choice; pregnant women with a Medicaid card; and children who have applied for Medicaid or [place] Health Choice, but were declined, whose parents can pay a reduced fee at time of service.  °[facility] [place]  [address], [place] (336) 641-7733 Accepts children up to age 21 who are enrolled in Medicaid or [place] Health Choice; pregnant women with a Medicaid card; and children who have applied for Medicaid or  Health Choice, but were declined, whose parents can pay a reduced fee at time of service.  °[facility] ° [address], [place] (336)  641-4533 Patients are seen by appointment only. Walk-ins are not accepted. [facility] will see patients 18 years of age and older. °Monday - Tuesday (8am-5pm) °Most Wednesdays (8:30-5pm) °$30 per visit, cash only  °[facility] ° [address], [place] (336) 641-4533 Patients are seen by appointment only. Walk-ins are not accepted. [facility] will see patients 18 years of age and older. °One Wednesday Evening (Monthly: Volunteer Based).  $30 per visit, cash only  °[facility]  (919) 537-3737 for adults; Children under age 4, call Graduate   Pediatric Dentistry at (919) [phone]. Children aged 4-14, please call (919) [phone] to request a pediatric application. ° Dental services are provided in all areas of dental care including fillings, crowns and bridges, complete and partial dentures, implants, gum treatment, root canals, and extractions. Preventive care is also provided. Treatment is provided to both adults and children. °Patients are selected via a lottery and there is often a waiting list. °  °[facility] [address], °[place] ° (336) 763-8833 [email or web address] °  °[facility] [address], [place], [place] (336)723-1848, Ext. 123 Second and Fourth Thursday of each month, opens at 6:30 AM; Clinic ends at 9 AM.  Patients are seen on a first-come first-served basis, and a limited number are seen during each clinic.  ° °[facility] ° [address], [place], [place] (336) 723-7904   Eligibility Requirements °You must have lived in [place], [place], or [place] counties for at least the last three months. °  You cannot be eligible for state or federal sponsored healthcare insurance, including Veterans Administration, Medicaid, or Medicare. °  You generally cannot be eligible for healthcare insurance through your employer.  °  How to apply: °Eligibility screenings are held every Tuesday and Wednesday afternoon  from 1:00 pm until 4:00 pm. You do not need an appointment for the interview!  °[facility] [address], [place], [place] 336-631-2330   °[facility]  336-342-8273   °[facility]  336-703-3100   °[facility]  336-570-6415   ° °Behavioral Health Resources in the Community: °Intensive Outpatient Programs °Organization         Address  Phone  Notes  °[facility] [address], [place], [place] 336-878-6098   °[facility] [address], [place], [place] 336-832-9800   °[facility] [address], [place], [place] ° 336-882-2125   °[facility] [address],  °[place], [place] 1-800-853-5163 or 336-641-4981   °Substance Abuse Resources °Organization         Address  Phone  Notes  °[facility]  336-882-2125   °[facility]  336-784-9470   °[facility]  336-285-9073   °[facility]  336-845-3988   °Residential & Outpatient Substance Abuse Program  1-800-659-3381   °Psychological Services °Organization         Address  Phone  Notes  °[facility]  336- 832-9600   °[facility]  336- 378-7881   °[facility] [address], [place] 1-800-853-5163 or 336-641-4981   ° °Mobile Crisis Teams °Organization         Address  Phone  Notes  °[facility], Mobile Crisis Care Unit  1-877-626-1772   °Assertive °[facility] ° [address] [place], [place] 336-834-9664   °Sharon DeEsch [address] °[place] 336-554-5454   ° °Self-Help/Support Groups °Organization         Address  Phone             Notes  °[facility] - variety of support groups  336- 373-1402 Call for more information  °Narcotics Anonymous (NA), [facility] [address], °[place]  2 meetings at this location  ° °Residential Treatment  Programs °Organization         Address  Phone  Notes  °[facility] [address],    °  [place]  1-866-801-8205   °[facility] ° [address], [place], [place] 704-293-8524   °[facility] [address], [place] 336-845-3988 Admissions: 8am-3pm M-F  °[facility] [address]    °[place], [place] 336-841-1104   °[facility] [address], [place], [place] 336-379-7146   °[facility] [address]  °[place], [place] 336-285-9073   °[facility] - Intensive Outpatient [address], [place], [place] 336-852-3033   °[facility] [address]  °[place], [place] 1-877-615-2722 or 336-784-9470   °[facility] [address] [place], [place] 336-227-7417 Accepts Medicaid  °[facility] [address]  °[place] 1-800-659-3381 Substance Abuse/Addiction Treatment  ° °[place] Behavioral Health Resources °Organization         Address  Phone  Notes  °[facility]  (888) 581-9988   °Julie Brannon, PhD [address] [place], [place]   (336) 349-5553 or (336) 951-0000   ° Behavioral   [address] °[place], [place] (336) 349-4454   °[facility] [address], [place], [place] (336) 342-8316 Insurance/Medicaid/sponsorship through [facility]  °[facility] [address]                                    [place], [place] (336) 342-8316 Therapy/tele-psych/case  °[facility] [address]  ° [place],  (336) 349-2233    °Dr. Arfeen  (336) 349-4544   °[facility] [place]  [employer] [place] [facility] 1) [address],  °2) [address], [place] °3)  [address], [place] (336) 349-3220 °(336) 342-7768 ° °(336) 342-8140   °[place] [facility] (336) 342-1394 or (336) 342-3537 (After Hours)    ° ° ° °

## 2013-10-10 NOTE — BH Assessment (Signed)
Writer followed up with [facility] to verify patient's acceptance to the wait list. Per Pam, patient has not been accepted to the wait list and pending medical review. Sts that the medical review should take place this am. TTS will follow up with [facility].

## 2013-10-10 NOTE — ED Notes (Addendum)
Care transferred, report received Robin W, RN. 

## 2013-10-11 NOTE — Consult Note (Signed)
Case discussed, and agree with plan 

## 2013-10-26 ENCOUNTER — Ambulatory Visit: Payer: Self-pay | Admitting: Internal Medicine

## 2013-10-30 ENCOUNTER — Ambulatory Visit: Payer: Self-pay | Attending: Internal Medicine | Admitting: Internal Medicine

## 2013-10-30 ENCOUNTER — Encounter: Payer: Self-pay | Admitting: Internal Medicine

## 2013-10-30 VITALS — BP 127/85 | HR 70 | Temp 98.9°F | Resp 16 | Ht 72.0 in | Wt 190.0 lb

## 2013-10-30 DIAGNOSIS — Z87891 Personal history of nicotine dependence: Secondary | ICD-10-CM | POA: Insufficient documentation

## 2013-10-30 DIAGNOSIS — F32A Depression, unspecified: Secondary | ICD-10-CM

## 2013-10-30 DIAGNOSIS — F3289 Other specified depressive episodes: Secondary | ICD-10-CM | POA: Insufficient documentation

## 2013-10-30 DIAGNOSIS — F329 Major depressive disorder, single episode, unspecified: Secondary | ICD-10-CM | POA: Insufficient documentation

## 2013-10-30 DIAGNOSIS — Z7982 Long term (current) use of aspirin: Secondary | ICD-10-CM | POA: Insufficient documentation

## 2013-10-30 DIAGNOSIS — F640 Transsexualism: Secondary | ICD-10-CM | POA: Insufficient documentation

## 2013-10-30 NOTE — Progress Notes (Signed)
Patient ID: Frederick Hess, male   DOB: 1985-11-01, 28 y.o.   MRN: [id number]   Frederick Hess, is a 28 y.o. male  [id number]  [id number]  DOB - 1985-11-01  CC:  Chief Complaint  Patient presents with  . Establish Care       HPI: Frederick Hess is a 28 y.o. male here today to establish medical care. Patient has no significant past medical history, his transsexual now male and prefer to be called Frederick Hess. 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 Patient has no family support and has been into prostitution to make ends meet. She has been suicidal several times and has been admitted into behavioral unit, most of the time in the ED for suicidal attempt or ideation. She has no complaint today except that she needs help with her hormonal therapy and also possible financial help. She thinks she will need evaluation by a specialist. She admits to being depressed but right now no suicidal ideation or attempt in her mind. He is a former smoker and she drinks alcohol occasionally. Patient has No headache, No chest pain, No abdominal pain - No Nausea, No new weakness tingling or numbness, No Cough - SOB.  Allergies  Allergen Reactions  . Amoxicillin Hives   Past Medical History  Diagnosis Date  . Hormone replacement therapy   . Transexualism    Current Outpatient Prescriptions on File Prior to Visit  Medication Sig Dispense Refill  . estradiol (ESTRACE) 1 MG tablet Take 1 mg by mouth daily.      Marland Kitchen. estrogens, conjugated, (PREMARIN) 1.25 MG tablet Take 1.25 mg by mouth daily.      . ASPIRIN PO Take by mouth once.      Marland Kitchen. Phenyleph-Doxylamine-DM-APAP (COLD & FLU NIGHTTIME/DAYTIME PO) Take by mouth once.       No current facility-administered medications on file prior to visit.   History reviewed. No pertinent family  history. History   Social History  . Marital Status: Single    Spouse Name: N/A    Number of Children: N/A  . Years of Education: N/A   Occupational History  . Not on file.   Social History Main Topics  . Smoking status: Former Smoker -- 1.00 packs/day  . Smokeless tobacco: Not on file  . Alcohol Use: Yes     Comment: Rarely  . Drug Use: Yes    Special: Marijuana  . Sexual Activity: Not on file   Other Topics Concern  . Not on file   Social History Narrative  . No narrative on file    Review of Systems: Constitutional: Negative for fever, chills, diaphoresis, activity change, appetite change and fatigue. HENT: Negative for ear pain, nosebleeds, congestion, facial swelling, rhinorrhea, neck pain, neck stiffness and ear discharge.  Eyes: Negative for pain, discharge, redness, itching and visual disturbance. Respiratory: Negative for cough, choking, chest tightness, shortness of breath, wheezing and stridor.  Cardiovascular: Negative for chest pain, palpitations and leg swelling. Gastrointestinal: Negative for abdominal distention. Genitourinary: Negative for dysuria, urgency, frequency, hematuria, flank pain, decreased urine volume, difficulty urinating and dyspareunia.  Musculoskeletal: Negative for back pain, joint swelling, arthralgia and gait problem. Neurological: Negative for dizziness, tremors, seizures, syncope, facial asymmetry, speech difficulty, weakness, light-headedness, numbness and headaches.  Hematological: Negative for adenopathy. Does not bruise/bleed easily. Psychiatric/Behavioral: Negative for hallucinations, behavioral  problems, confusion, dysphoric mood, decreased concentration and agitation.    Objective:   Filed Vitals:   10/30/13 1442  BP: 127/85  Pulse: 70  Temp: 98.9 F (37.2 C)  Resp: 16    Physical Exam: Constitutional: Patient appears well-developed and well-nourished. No distress. HENT: Normocephalic, atraumatic, External right and  left ear normal. Oropharynx is clear and moist.  Eyes: Conjunctivae and EOM are normal. PERRLA, no scleral icterus. Neck: Normal ROM. Neck supple. No JVD. No tracheal deviation. No thyromegaly. CVS: RRR, S1/S2 +, no murmurs, no gallops, no carotid bruit.  Pulmonary: Effort and breath sounds normal, no stridor, rhonchi, wheezes, rales.  Abdominal: Soft. BS +, no distension, tenderness, rebound or guarding.  Musculoskeletal: Normal range of motion. No edema and no tenderness.  Lymphadenopathy: No lymphadenopathy noted, cervical, inguinal or axillary Neuro: Alert. Normal reflexes, muscle tone coordination. No cranial nerve deficit. Skin: Skin is warm and dry. No rash noted. Not diaphoretic. No erythema. No pallor. Psychiatric: Normal mood and affect. Behavior, judgment, thought content normal.  Lab Results  Component Value Date   WBC 8.9 10/08/2013   HGB 13.5 10/08/2013   HCT 38.5* 10/08/2013   MCV 87.5 10/08/2013   PLT 178 10/08/2013   Lab Results  Component Value Date   CREATININE 0.89 10/08/2013   BUN 6 10/08/2013   NA 140 10/08/2013   K 3.6* 10/08/2013   CL 100 10/08/2013   CO2 26 10/08/2013    No results found for this basename: HGBA1C   Lipid Panel  No results found for this basename: chol, trig, hdl, cholhdl, vldl, ldlcalc       Assessment and plan:   1. Depression (emotion)  - Ambulatory referral to Psychiatry  Patient declined referral for hormonal therapy evaluation Patient declined to see her social worker in the clinic today for counseling  Patient was counseled extensively about nutrition and exercise  Return in about 1 year (around 10/31/2014), or if symptoms worsen or fail to improve, for Annual Physical.  The patient was given clear instructions to go to ER or return to medical center if symptoms don't improve, worsen or new problems develop. The patient verbalized understanding. The patient was told to call to get lab results if they haven't heard anything in the  next week.     This note has been created with Education officer, environmental. Any transcriptional errors are unintentional.    Jeanann Lewandowsky, MD, MHA, FACP, FAAP [facility] [place], [place] [phone]   10/30/2013, 4:00 PM

## 2013-10-30 NOTE — Progress Notes (Signed)
Pt was in the the ED she tried to comment suicide overdosing on Asprin.

## 2014-04-28 ENCOUNTER — Encounter (HOSPITAL_COMMUNITY): Payer: Self-pay | Admitting: Emergency Medicine

## 2014-04-28 ENCOUNTER — Emergency Department (HOSPITAL_COMMUNITY)
Admission: EM | Admit: 2014-04-28 | Discharge: 2014-04-28 | Disposition: A | Discharge status: Home / Self Care | Payer: Self-pay | Attending: Emergency Medicine | Admitting: Emergency Medicine

## 2014-04-28 ENCOUNTER — Emergency Department (HOSPITAL_COMMUNITY): Payer: Self-pay

## 2014-04-28 DIAGNOSIS — Z880 Allergy status to penicillin: Secondary | ICD-10-CM | POA: Insufficient documentation

## 2014-04-28 DIAGNOSIS — M2662 Arthralgia of temporomandibular joint: Secondary | ICD-10-CM | POA: Insufficient documentation

## 2014-04-28 DIAGNOSIS — R6884 Jaw pain: Secondary | ICD-10-CM

## 2014-04-28 DIAGNOSIS — Z79899 Other long term (current) drug therapy: Secondary | ICD-10-CM | POA: Insufficient documentation

## 2014-04-28 DIAGNOSIS — M26629 Arthralgia of temporomandibular joint, unspecified side: Secondary | ICD-10-CM

## 2014-04-28 DIAGNOSIS — Z87891 Personal history of nicotine dependence: Secondary | ICD-10-CM | POA: Insufficient documentation

## 2014-04-28 DIAGNOSIS — K529 Noninfective gastroenteritis and colitis, unspecified: Secondary | ICD-10-CM | POA: Insufficient documentation

## 2014-04-28 DIAGNOSIS — Z8659 Personal history of other mental and behavioral disorders: Secondary | ICD-10-CM | POA: Insufficient documentation

## 2014-04-28 MED ORDER — IBUPROFEN 600 MG PO TABS: 600.0000 mg | ORAL_TABLET | Freq: Four times a day (QID) | ORAL | Status: DC | PRN

## 2014-04-28 MED ORDER — IBUPROFEN 200 MG PO TABS
600.0000 mg | ORAL_TABLET | Freq: Once | ORAL | Status: AC
  Administered 2014-04-28: 600 mg via ORAL
  Filled 2014-04-28: qty 3

## 2014-04-28 MED ORDER — ONDANSETRON 4 MG PO TBDP: ORAL_TABLET | ORAL | Status: DC

## 2014-04-28 NOTE — ED Notes (Addendum)
The patient was asleep and she yawned her jaw "popped".  She rates her pain 10/10.  She denies any injury.  She did say she has had N/V and diarrhea for a day.  Patient says she has been febrile.

## 2014-04-28 NOTE — ED Provider Notes (Signed)
CSN: [id number]     Arrival date & time 04/28/14  0203 History  This chart was scribed for Loren Racer, MD by Abel Presto, ED Scribe. This patient was seen in room A08C/A08C and the patient's care was started at 2:10 AM.    Chief Complaint  Patient presents with  . Jaw Pain    The patient was asleep and she yawned her jaw "popped".  She rates her pain 10/10.   The history is provided by the patient. No language interpreter was used.   HPI Comments: Frederick Hess is a 29 y.o. male who presents to the Emergency Department complaining of left -sided jaw pain with onset 30 min PTA after yawning.  Pt notes pain with chewing and associated left sided facial swelling. No malocclusion On further question patient with fever and chills since yesterday. She's also had multiple episodes of diarrhea and mild nausea. She states she's had no sore throat, nasal congestion, cough, shortness of breath, abdominal pain. Denies any urinary symptoms. No sick contacts. No recent travel. No neck pain or stiffness. Past Medical History  Diagnosis Date  . Hormone replacement therapy   . Transexualism    Past Surgical History  Procedure Laterality Date  . Cosmetic surgery      rhinoplasty   History reviewed. No pertinent family history. History  Substance Use Topics  . Smoking status: Former Smoker -- 1.00 packs/day  . Smokeless tobacco: Not on file  . Alcohol Use: Yes     Comment: Rarely    Review of Systems  Constitutional: Positive for fever and chills.  HENT: Positive for facial swelling. Negative for congestion, rhinorrhea, sinus pressure, sore throat, trouble swallowing and voice change.        Jaw pain  Respiratory: Negative for cough, shortness of breath and wheezing.   Gastrointestinal: Positive for nausea, vomiting and diarrhea. Negative for abdominal pain and constipation.  Genitourinary: Negative for dysuria, frequency, hematuria and flank pain.  Musculoskeletal: Negative for  myalgias, back pain, neck pain and neck stiffness.  Skin: Negative for rash and wound.  Neurological: Negative for dizziness, weakness, light-headedness, numbness and headaches.  All other systems reviewed and are negative.     Allergies  Amoxicillin  Home Medications   Prior to Admission medications   Medication Sig Start Date End Date Taking? Authorizing Provider  ASPIRIN PO Take by mouth once.    Historical Provider, MD  estradiol (ESTRACE) 1 MG tablet Take 1 mg by mouth daily.    Historical Provider, MD  estrogens, conjugated, (PREMARIN) 1.25 MG tablet Take 1.25 mg by mouth daily.    Historical Provider, MD  ibuprofen (ADVIL,MOTRIN) 600 MG tablet Take 1 tablet (600 mg total) by mouth every 6 (six) hours as needed. 04/28/14   Loren Racer, MD  ondansetron (ZOFRAN ODT) 4 MG disintegrating tablet  ODT q4 hours prn nausea/vomit 04/28/14   Loren Racer, MD  Phenyleph-Doxylamine-DM-APAP (COLD & FLU NIGHTTIME/DAYTIME PO) Take by mouth once.    Historical Provider, MD   BP 128/63 mmHg  Pulse 84  Temp(Src) 100.2 F (37.9 C) (Oral)  Resp 16  Ht 6' (1.829 m)  Wt 185 lb (83.915 kg)  BMI 25.08 kg/m2  SpO2 98% Physical Exam  Constitutional: He is oriented to person, place, and time. He appears well-developed and well-nourished. No distress.  HENT:  Head: Normocephalic and atraumatic.  Mouth/Throat: Oropharynx is clear and moist. No oropharyngeal exudate.  No asymmetry noted. No malocclusion. Tenderness over the left TMJ.  Eyes: Conjunctivae  and EOM are normal. Pupils are equal, round, and reactive to light.  Neck: Normal range of motion. Neck supple.  No meningismus  Cardiovascular: Normal rate and regular rhythm.  Exam reveals no gallop and no friction rub.   No murmur heard. Pulmonary/Chest: Effort normal and breath sounds normal. No respiratory distress. He has no wheezes. He has no rales.  Abdominal: Soft. Bowel sounds are normal. He exhibits no distension and no mass.  There is no tenderness. There is no rebound and no guarding.  Musculoskeletal: Normal range of motion. He exhibits no edema or tenderness.  No CVA tenderness bilaterally.  Lymphadenopathy:    He has no cervical adenopathy.  Neurological: He is alert and oriented to person, place, and time.  Skin: Skin is warm and dry. No rash noted. No erythema.  Psychiatric: He has a normal mood and affect. His behavior is normal.  Nursing note and vitals reviewed.   ED Course  Procedures (including critical care time) DIAGNOSTIC STUDIES: Oxygen Saturation is 98% on room air, normal by my interpretation.    COORDINATION OF CARE: 2:12 AM Discussed treatment plan with patient at beside, the patient agrees with the plan and has no further questions at this time.   Labs Review Labs Reviewed - No data to display  Imaging Review Dg Orthopantogram  04/28/2014   CLINICAL DATA:  Woke up with left-sided jaw pain and swelling. Initial encounter.  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 No definite periapical abscesses are seen along the mandible. The mandible appears intact. Visualized paranasal sinuses are well-aerated.  IMPRESSION: No definite periapical abscess seen.  The mandible appears intact.   Electronically Signed   By: Roanna RaiderJeffery  Chang M.D.   On: 04/28/2014 03:51     EKG Interpretation None      MDM   Final diagnoses:  Jaw pain  TMJ arthralgia  Gastroenteritis    I personally performed the services described in this documentation, which was scribed in my presence. The recorded information has been reviewed and is accurate.  Rashes very well-appearing. Panorex without any acute abnormality. No dislocation. Patient appears to have 2 processes going on one is a gastroenteritis is causing her to have a fever. Her abdomen is benign. She is tolerating oral. She's also having  left TMJ pain. There is no obvious structural deformity. We'll treat with NSAIDs and have follow-up with ENT if symptoms persist. She's been given return precautions and is voiced understanding.    Loren Raceravid Jad Johansson, MD 04/28/14 [phone]

## 2014-04-28 NOTE — ED Notes (Signed)
Patient is alert and orientedx4.  Patient was explained discharge instructions and they understood them with no questions.   

## 2014-04-28 NOTE — Discharge Instructions (Signed)
Viral Gastroenteritis °Viral gastroenteritis is also known as stomach flu. This condition affects the stomach and intestinal tract. It can cause sudden diarrhea and vomiting. The illness typically lasts 3 to 8 days. Most people develop an immune response that eventually gets rid of the virus. While this natural response develops, the virus can make you quite ill. °CAUSES  °Many different viruses can cause gastroenteritis, such as rotavirus or noroviruses. You can catch one of these viruses by consuming contaminated food or water. You may also catch a virus by sharing utensils or other personal items with an infected person or by touching a contaminated surface. °SYMPTOMS  °The most common symptoms are diarrhea and vomiting. These problems can cause a severe loss of body fluids (dehydration) and a body salt (electrolyte) imbalance. Other symptoms may include: °· Fever. °· Headache. °· Fatigue. °· Abdominal pain. °DIAGNOSIS  °Your caregiver can usually diagnose viral gastroenteritis based on your symptoms and a physical exam. A stool sample may also be taken to test for the presence of viruses or other infections. °TREATMENT  °This illness typically goes away on its own. Treatments are aimed at rehydration. The most serious cases of viral gastroenteritis involve vomiting so severely that you are not able to keep fluids down. In these cases, fluids must be given through an intravenous line (IV). °HOME CARE INSTRUCTIONS  °· Drink enough fluids to keep your urine clear or pale yellow. Drink small amounts of fluids frequently and increase the amounts as tolerated. °· Ask your caregiver for specific rehydration instructions. °· Avoid: °¨ Foods high in sugar. °¨ Alcohol. °¨ Carbonated drinks. °¨ Tobacco. °¨ Juice. °¨ Caffeine drinks. °¨ Extremely hot or cold fluids. °¨ Fatty, greasy foods. °¨ Too much intake of anything at one time. °¨ Dairy products until 24 to 48 hours after diarrhea stops. °· You may consume probiotics.  Probiotics are active cultures of beneficial bacteria. They may lessen the amount and number of diarrheal stools in adults. Probiotics can be found in yogurt with active cultures and in supplements. °· Wash your hands well to avoid spreading the virus. °· Only take over-the-counter or prescription medicines for pain, discomfort, or fever as directed by your caregiver. Do not give aspirin to children. Antidiarrheal medicines are not recommended. °· Ask your caregiver if you should continue to take your regular prescribed and over-the-counter medicines. °· Keep all follow-up appointments as directed by your caregiver. °SEEK IMMEDIATE MEDICAL CARE IF:  °· You are unable to keep fluids down. °· You do not urinate at least once every 6 to 8 hours. °· You develop shortness of breath. °· You notice blood in your stool or vomit. This may look like coffee grounds. °· You have abdominal pain that increases or is concentrated in one small area (localized). °· You have persistent vomiting or diarrhea. °· You have a fever. °· The patient is a child younger than 3 months, and he or she has a fever. °· The patient is a child older than 3 months, and he or she has a fever and persistent symptoms. °· The patient is a child older than 3 months, and he or she has a fever and symptoms suddenly get worse. °· The patient is a baby, and he or she has no tears when crying. °MAKE SURE YOU:  °· Understand these instructions. °· Will watch your condition. °· Will get help right away if you are not doing well or get worse. °Document Released: 03/02/2005 Document Revised: 05/25/2011 Document Reviewed: 12/17/2010 °  ExitCare Patient Information 2015 [place], [place]. This information is not intended to replace advice given to you by your health care provider. Make sure you discuss any questions you have with your health care provider.  Temporomandibular Problems  Temporomandibular joint (TMJ) dysfunction means there are problems with the joint  between your jaw and your skull. This is a joint lined by cartilage like other joints in your body but also has a small disc in the joint which keeps the bones from rubbing on each other. These joints are like other joints and can get inflamed (sore) from arthritis and other problems. When this joint gets sore, it can cause headaches and pain in the jaw and the face. CAUSES  Usually the arthritic types of problems are caused by soreness in the joint. Soreness in the joint can also be caused by overuse. This may come from grinding your teeth. It may also come from mis-alignment in the joint. DIAGNOSIS Diagnosis of this condition can often be made by history and exam. Sometimes your caregiver may need X-rays or an MRI scan to determine the exact cause. It may be necessary to see your dentist to determine if your teeth and jaws are lined up correctly. TREATMENT  Most of the time this problem is not serious; however, sometimes it can persist (become chronic). When this happens medications that will cut down on inflammation (soreness) help. Sometimes a shot of cortisone into the joint will be helpful. If your teeth are not aligned it may help for your dentist to make a splint for your mouth that can help this problem. If no physical problems can be found, the problem may come from tension. If tension is found to be the cause, biofeedback or relaxation techniques may be helpful. HOME CARE INSTRUCTIONS   Later in the day, applications of ice packs may be helpful. Ice can be used in a plastic bag with a towel around it to prevent frostbite to skin. This may be used about every 2 hours for 20 to 30 minutes, as needed while awake, or as directed by your caregiver.  Only take over-the-counter or prescription medicines for pain, discomfort, or fever as directed by your caregiver.  If physical therapy was prescribed, follow your caregiver's directions.  Wear mouth appliances as directed if they were  given. Document Released: 11/25/2000 Document Revised: 05/25/2011 Document Reviewed: 03/04/2008 [facility] Patient Information 2015 [place], [place]. This information is not intended to replace advice given to you by your health care provider. Make sure you discuss any questions you have with your health care provider.

## 2014-05-02 ENCOUNTER — Encounter (HOSPITAL_COMMUNITY): Payer: Self-pay | Admitting: Neurology

## 2014-05-02 ENCOUNTER — Emergency Department (HOSPITAL_COMMUNITY)
Admission: EM | Admit: 2014-05-02 | Discharge: 2014-05-02 | Disposition: A | Discharge status: Home / Self Care | Payer: Self-pay | Attending: Emergency Medicine | Admitting: Emergency Medicine

## 2014-05-02 DIAGNOSIS — R319 Hematuria, unspecified: Secondary | ICD-10-CM

## 2014-05-02 DIAGNOSIS — R197 Diarrhea, unspecified: Secondary | ICD-10-CM | POA: Insufficient documentation

## 2014-05-02 DIAGNOSIS — N390 Urinary tract infection, site not specified: Secondary | ICD-10-CM | POA: Insufficient documentation

## 2014-05-02 DIAGNOSIS — Z8659 Personal history of other mental and behavioral disorders: Secondary | ICD-10-CM | POA: Insufficient documentation

## 2014-05-02 DIAGNOSIS — R112 Nausea with vomiting, unspecified: Secondary | ICD-10-CM | POA: Insufficient documentation

## 2014-05-02 DIAGNOSIS — Z7982 Long term (current) use of aspirin: Secondary | ICD-10-CM | POA: Insufficient documentation

## 2014-05-02 DIAGNOSIS — Z880 Allergy status to penicillin: Secondary | ICD-10-CM | POA: Insufficient documentation

## 2014-05-02 DIAGNOSIS — Z79899 Other long term (current) drug therapy: Secondary | ICD-10-CM | POA: Insufficient documentation

## 2014-05-02 DIAGNOSIS — Z87891 Personal history of nicotine dependence: Secondary | ICD-10-CM | POA: Insufficient documentation

## 2014-05-02 DIAGNOSIS — R630 Anorexia: Secondary | ICD-10-CM | POA: Insufficient documentation

## 2014-05-02 LAB — COMPREHENSIVE METABOLIC PANEL
ALT: 18 U/L (ref 0–53)
ANION GAP: 7 (ref 5–15)
AST: 22 U/L (ref 0–37)
Albumin: 3.5 g/dL (ref 3.5–5.2)
Alkaline Phosphatase: 30 U/L — ABNORMAL LOW (ref 39–117)
CALCIUM: 8.6 mg/dL (ref 8.4–10.5)
CO2: 29 mmol/L (ref 19–32)
CREATININE: 0.92 mg/dL (ref 0.50–1.35)
Chloride: 104 mmol/L (ref 96–112)
GLUCOSE: 88 mg/dL (ref 70–99)
Potassium: 3.2 mmol/L — ABNORMAL LOW (ref 3.5–5.1)
Sodium: 140 mmol/L (ref 135–145)
TOTAL PROTEIN: 6.4 g/dL (ref 6.0–8.3)
Total Bilirubin: 1.1 mg/dL (ref 0.3–1.2)

## 2014-05-02 LAB — CBC WITH DIFFERENTIAL/PLATELET
BASOS PCT: 0 % (ref 0–1)
Basophils Absolute: 0 10*3/uL (ref 0.0–0.1)
EOS PCT: 2 % (ref 0–5)
Eosinophils Absolute: 0.1 10*3/uL (ref 0.0–0.7)
HCT: 33.3 % — ABNORMAL LOW (ref 39.0–52.0)
Hemoglobin: 11.8 g/dL — ABNORMAL LOW (ref 13.0–17.0)
Lymphocytes Relative: 40 % (ref 12–46)
Lymphs Abs: 1.3 10*3/uL (ref 0.7–4.0)
MCH: 29.6 pg (ref 26.0–34.0)
MCHC: 35.4 g/dL (ref 30.0–36.0)
MCV: 83.7 fL (ref 78.0–100.0)
MONO ABS: 0.4 10*3/uL (ref 0.1–1.0)
Monocytes Relative: 13 % — ABNORMAL HIGH (ref 3–12)
NEUTROS ABS: 1.5 10*3/uL — AB (ref 1.7–7.7)
Neutrophils Relative %: 44 % (ref 43–77)
Platelets: 101 10*3/uL — ABNORMAL LOW (ref 150–400)
RBC: 3.98 MIL/uL — ABNORMAL LOW (ref 4.22–5.81)
RDW: 13.3 % (ref 11.5–15.5)
WBC: 3.3 10*3/uL — ABNORMAL LOW (ref 4.0–10.5)

## 2014-05-02 LAB — URINALYSIS, ROUTINE W REFLEX MICROSCOPIC
Bilirubin Urine: NEGATIVE
Glucose, UA: NEGATIVE mg/dL
Ketones, ur: NEGATIVE mg/dL
Nitrite: NEGATIVE
PH: 6 (ref 5.0–8.0)
Protein, ur: NEGATIVE mg/dL
Specific Gravity, Urine: 1.017 (ref 1.005–1.030)
UROBILINOGEN UA: 1 mg/dL (ref 0.0–1.0)

## 2014-05-02 LAB — URINE MICROSCOPIC-ADD ON

## 2014-05-02 MED ORDER — CEPHALEXIN 500 MG PO CAPS: 500.0000 mg | ORAL_CAPSULE | Freq: Two times a day (BID) | ORAL | Status: DC

## 2014-05-02 MED ORDER — POTASSIUM CHLORIDE CRYS ER 20 MEQ PO TBCR
40.0000 meq | EXTENDED_RELEASE_TABLET | Freq: Once | ORAL | Status: AC
  Administered 2014-05-02: 40 meq via ORAL
  Filled 2014-05-02: qty 2

## 2014-05-02 MED ORDER — ONDANSETRON 4 MG PO TBDP: 4.0000 mg | ORAL_TABLET | Freq: Three times a day (TID) | ORAL | Status: DC | PRN

## 2014-05-02 MED ORDER — SODIUM CHLORIDE 0.9 % IV BOLUS (SEPSIS)
1000.0000 mL | Freq: Once | INTRAVENOUS | Status: AC
  Administered 2014-05-02: 1000 mL via INTRAVENOUS

## 2014-05-02 MED ORDER — ONDANSETRON HCL 4 MG/2ML IJ SOLN
4.0000 mg | Freq: Once | INTRAMUSCULAR | Status: AC
  Administered 2014-05-02: 4 mg via INTRAVENOUS
  Filled 2014-05-02: qty 2

## 2014-05-02 MED ORDER — SULFAMETHOXAZOLE-TRIMETHOPRIM 800-160 MG PO TABS
1.0000 | ORAL_TABLET | Freq: Once | ORAL | Status: AC
  Administered 2014-05-02: 1 via ORAL
  Filled 2014-05-02: qty 1

## 2014-05-02 NOTE — Discharge Instructions (Signed)

## 2014-05-02 NOTE — ED Notes (Signed)
Dr. Ward at bedside.

## 2014-05-02 NOTE — ED Notes (Signed)
Pt reports getting over stomach virus 4 days ago. Today c/o still feeling weak and not having an appetite. No pain. No n/v/d

## 2014-05-02 NOTE — ED Provider Notes (Signed)
TIME SEEN: 8:40 AM  CHIEF COMPLAINT: Generalized weakness, decreased appetite, nausea  HPI: Pt is a 29 y.o. 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 Reports 3-4 days ago having 3 days of nausea, vomiting and diarrhea. Denies any recent fever but has had night sweats over the past several days. Feels like she has been losing weight. Has nausea with eating. No vomiting or diarrhea in the past 3 days. No bloody stool or melena. No dysuria or hematuria. No sick contacts. Patient did travel to [place] 5 months ago.  ROS: See HPI Constitutional: no fever  Eyes: no drainage  ENT: no runny nose   Cardiovascular:  no chest pain  Resp: no SOB  GI: no vomiting GU: no dysuria Integumentary: no rash  Allergy: no hives  Musculoskeletal: no leg swelling  Neurological: no slurred speech ROS otherwise negative  PAST MEDICAL HISTORY/PAST SURGICAL HISTORY:  Past Medical History  Diagnosis Date  . Hormone replacement therapy   . Transexualism     MEDICATIONS:  Prior to Admission medications   Medication Sig Start Date End Date Taking? Authorizing Provider  ASPIRIN PO Take by mouth once.    Historical Provider, MD  estradiol (ESTRACE) 1 MG tablet Take 1 mg by mouth daily.    Historical Provider, MD  estrogens, conjugated, (PREMARIN) 1.25 MG tablet Take 1.25 mg by mouth daily.    Historical Provider, MD  ibuprofen (ADVIL,MOTRIN) 600 MG tablet Take 1 tablet (600 mg total) by mouth every 6 (six) hours as needed. 04/28/14   Loren Racer, MD  ondansetron (ZOFRAN ODT) 4 MG disintegrating tablet  ODT q4 hours prn nausea/vomit 04/28/14   Loren Racer, MD  Phenyleph-Doxylamine-DM-APAP (COLD & FLU NIGHTTIME/DAYTIME PO) Take by mouth once.    Historical Provider, MD    ALLERGIES:  Allergies  Allergen Reactions  . Amoxicillin Hives    SOCIAL  HISTORY:  History  Substance Use Topics  . Smoking status: Former Smoker -- 1.00 packs/day  . Smokeless tobacco: Not on file  . Alcohol Use: Yes     Comment: Rarely    FAMILY HISTORY: No family history on file.  EXAM: BP 128/75 mmHg  Pulse 61  Temp(Src) 98.1 F (36.7 C) (Oral)  Resp 14  Ht 6' (1.829 m)  Wt 185 lb (83.915 kg)  BMI 25.08 kg/m2  SpO2 96% CONSTITUTIONAL: Alert and oriented and responds appropriately to questions. Well-appearing; well-nourished HEAD: Normocephalic EYES: Conjunctivae clear, PERRL ENT: normal nose; no rhinorrhea; moist mucous membranes; pharynx without lesions noted NECK: Supple, no meningismus, no LAD  CARD: RRR; S1 and S2 appreciated; no murmurs, no clicks, no rubs, no gallops RESP: Normal chest excursion without splinting or tachypnea; breath sounds clear and equal bilaterally; no wheezes, no rhonchi, no rales ABD/GI: Normal bowel sounds; non-distended; soft, non-tender, no rebound, no guarding; no peritoneal signs BACK:  The back appears normal and is non-tender to palpation, there is no CVA tenderness EXT: Normal ROM in all joints; non-tender to palpation; no edema; normal capillary refill; no cyanosis    SKIN: Normal color for age and race; warm NEURO: Moves all extremities equally PSYCH: The patient's mood and manner are appropriate. Grooming and personal hygiene are appropriate.  MEDICAL DECISION MAKING: Patient here generalized weakness after viral gastroenteritis. Abdominal exam is benign. Patient is hemodynamically stable. We'll give IV fluids, IV Zofran. We'll check basic labs and urine. Given patient is  well-appearing anticipate discharge home. Patient is already asking for something to eat in the ED.  ED PROGRESS: Labs unremarkable other than thrombocytopenia which appears chronic for patient. She is also mildly leukopenic which may be from a viral illness. Potassium slightly low, will replace in ED. Does appear to have a urinary tract  infection. We'll discharge on Keflex. Culture pending. Patient reports feeling much better and has been able to tolerate by mouth. We'll discharge with prescription for Zofran as well as Keflex. Discussed return precautions. She verbalizes understanding and is comfortable with plan.     Layla MawKristen N James Lafalce, DO 05/02/14 1724

## 2014-05-02 NOTE — ED Notes (Signed)
Patient had a 3 day episode of nausea vomiting and diarrhea and presents today after experiencing weakness and lack of appetite.  Patient is actually a male but wishes to be referred to as a male. Patient tried to eat chinese food last night.  Education provided on bland diets and light foods after experiencing gastrointestinal illness.

## 2014-05-03 LAB — URINE CULTURE

## 2014-05-08 ENCOUNTER — Emergency Department (HOSPITAL_COMMUNITY): Payer: Self-pay

## 2014-05-08 ENCOUNTER — Encounter (HOSPITAL_COMMUNITY): Payer: Self-pay

## 2014-05-08 ENCOUNTER — Emergency Department (HOSPITAL_COMMUNITY)
Admission: EM | Admit: 2014-05-08 | Discharge: 2014-05-08 | Disposition: A | Discharge status: Home / Self Care | Payer: Self-pay | Attending: Emergency Medicine | Admitting: Emergency Medicine

## 2014-05-08 DIAGNOSIS — M25551 Pain in right hip: Secondary | ICD-10-CM

## 2014-05-08 DIAGNOSIS — Z87891 Personal history of nicotine dependence: Secondary | ICD-10-CM | POA: Insufficient documentation

## 2014-05-08 DIAGNOSIS — Z792 Long term (current) use of antibiotics: Secondary | ICD-10-CM | POA: Insufficient documentation

## 2014-05-08 DIAGNOSIS — Z880 Allergy status to penicillin: Secondary | ICD-10-CM | POA: Insufficient documentation

## 2014-05-08 DIAGNOSIS — Y998 Other external cause status: Secondary | ICD-10-CM | POA: Insufficient documentation

## 2014-05-08 DIAGNOSIS — W010XXA Fall on same level from slipping, tripping and stumbling without subsequent striking against object, initial encounter: Secondary | ICD-10-CM | POA: Insufficient documentation

## 2014-05-08 DIAGNOSIS — S79911A Unspecified injury of right hip, initial encounter: Secondary | ICD-10-CM | POA: Insufficient documentation

## 2014-05-08 DIAGNOSIS — W19XXXA Unspecified fall, initial encounter: Secondary | ICD-10-CM

## 2014-05-08 DIAGNOSIS — Y9389 Activity, other specified: Secondary | ICD-10-CM | POA: Insufficient documentation

## 2014-05-08 DIAGNOSIS — Z8659 Personal history of other mental and behavioral disorders: Secondary | ICD-10-CM | POA: Insufficient documentation

## 2014-05-08 DIAGNOSIS — Z793 Long term (current) use of hormonal contraceptives: Secondary | ICD-10-CM | POA: Insufficient documentation

## 2014-05-08 DIAGNOSIS — Z79899 Other long term (current) drug therapy: Secondary | ICD-10-CM | POA: Insufficient documentation

## 2014-05-08 DIAGNOSIS — Y9289 Other specified places as the place of occurrence of the external cause: Secondary | ICD-10-CM | POA: Insufficient documentation

## 2014-05-08 NOTE — ED Notes (Signed)
Pt is male but recognizes as male. pt fell two days ago and has a knot on her right hip and has been having pain going down right leg. Wants to make sure nothing is wrong with her leg.

## 2014-05-08 NOTE — ED Notes (Signed)
Pt is on antibiotic for UTI DX 05-02-14.

## 2014-05-08 NOTE — ED Provider Notes (Signed)
CSN: [id number]     Arrival date & time 05/08/14  1336 History  This chart was scribed for non-physician practitioner, Frederick Horseman, PA-C working with Richardean Canal, MD by Greggory Stallion, ED scribe. This patient was seen in room TR07C/TR07C and the patient's care was started at 2:05 PM.   Chief Complaint  Patient presents with  . Leg Pain   The history is provided by the patient. No language interpreter was used.    HPI Comments: Frederick Hess is a 29 y.o. male to male transsexual who presents to the Emergency Department complaining of a fall that occurred 2 days ago. She states that she slipped and fell onto her right hip and now has right hip pain that radiates into her leg. Pt has illegal silicone injections in her buttocks and is unsure if that might be the cause of her pain. Bearing weight worsens pain. She has not yet taken anything for her symptoms. Pt denies fever.   Past Medical History  Diagnosis Date  . Hormone replacement therapy   . Transexualism    Past Surgical History  Procedure Laterality Date  . Cosmetic surgery      rhinoplasty   No family history on file. History  Substance Use Topics  . Smoking status: Former Smoker -- 1.00 packs/day  . Smokeless tobacco: Not on file  . Alcohol Use: Yes     Comment: Rarely    Review of Systems  Constitutional: Negative for fever.  HENT: Negative for congestion.   Eyes: Negative for redness.  Respiratory: Negative for shortness of breath.   Cardiovascular: Negative for chest pain.  Gastrointestinal: Negative for abdominal distention.  Musculoskeletal: Positive for myalgias and arthralgias.  Skin: Negative for rash.  Neurological: Negative for speech difficulty.  Psychiatric/Behavioral: Negative for confusion.   Allergies  Amoxicillin  Home Medications   Prior to Admission medications   Medication Sig Start Date End Date Taking? Authorizing Provider  cephALEXin (KEFLEX) 500 MG capsule Take 1 capsule (500 mg  total) by mouth 2 (two) times daily. 05/02/14   Kristen N Ward, DO  estradiol valerate (DELESTROGEN) 20 MG/ML injection Inject 20 mg into the muscle every 21 ( twenty-one) days.    Historical Provider, MD  estrogens, conjugated, (PREMARIN) 1.25 MG tablet Take 1.25 mg by mouth daily.    Historical Provider, MD  ibuprofen (ADVIL,MOTRIN) 600 MG tablet Take 1 tablet (600 mg total) by mouth every 6 (six) hours as needed. Patient not taking: Reported on 05/02/2014 04/28/14   Loren Racer, MD  ondansetron (ZOFRAN ODT) 4 MG disintegrating tablet Take 1 tablet (4 mg total) by mouth every 8 (eight) hours as needed for nausea or vomiting. 05/02/14   Kristen N Ward, DO   BP 125/82 mmHg  Pulse 79  Temp(Src) 98.2 F (36.8 C) (Oral)  Resp 16  Ht 6' (1.829 m)  Wt 185 lb (83.915 kg)  BMI 25.08 kg/m2  SpO2 97%   Physical Exam  Constitutional: He is oriented to person, place, and time. He appears well-developed. No distress.  HENT:  Head: Normocephalic and atraumatic.  Eyes: Conjunctivae and EOM are normal.  Cardiovascular: Normal rate and regular rhythm.   Pulmonary/Chest: Effort normal. No stridor. No respiratory distress.  Abdominal: He exhibits no distension.  Musculoskeletal: He exhibits no edema.  Right hip moderately tender to palpation. Able to weight bear. No bony abnormality or deformity. No contusion.  Neurological: He is alert and oriented to person, place, and time.  Skin: Skin is warm  and dry.  Psychiatric: He has a normal mood and affect.  Nursing note and vitals reviewed.   ED Course  Procedures (including critical care time)  DIAGNOSTIC STUDIES: Oxygen Saturation is 97% on RA, normal by my interpretation.    COORDINATION OF CARE: 2:06 PM-Discussed treatment plan which includes hip xray with pt at bedside and pt agreed to plan.   Labs Review Labs Reviewed - No data to display  Imaging Review Dg Hip Unilat With Pelvis 2-3 Views Right  05/08/2014   CLINICAL DATA:  Pain  following fall 2 days prior  EXAM: RIGHT HIP (WITH PELVIS) 2-3 VIEWS  COMPARISON:  None.  FINDINGS: Frontal pelvis as well as frontal and lateral right hip images were obtained. No fracture or dislocation. Joint spaces appear intact. There are tiny calcifications adjacent to each lateral acetabulum, felt to be of arthropathic etiology. There is ill-defined opacity in the soft tissues overlying both hips, most likely due to reported silicone buttocks region implants.  IMPRESSION: No fracture or dislocation.  No appreciable joint space narrowing.   Electronically Signed   By: Bretta BangWilliam  Woodruff III M.D.   On: 05/08/2014 15:22     EKG Interpretation None      MDM   Final diagnoses:  Fall  Hip pain, right    Patient with mechanical fall on right hip. Reports pain with palpation. She did have silicone injections into her right buttock. There is no evidence of infection. No erythema, abscess, or induration. Vital signs are stable. Will check plain films given the mechanical fall.  Plain films are negative. Patient is ambulatory. She is well-appearing. Will discharge to home with primary care follow-up. Advised against "black market" silicone injections.  I personally performed the services described in this documentation, which was scribed in my presence. The recorded information has been reviewed and is accurate.    Frederick HorsemanRobert Merl Bommarito, PA-C 05/08/14 1604  Richardean Canalavid H Yao, MD 05/08/14 [phone]

## 2014-05-08 NOTE — Discharge Instructions (Signed)
Arthralgia °Your caregiver has diagnosed you as suffering from an arthralgia. Arthralgia means there is pain in a joint. This can come from many reasons including: °· Bruising the joint which causes soreness (inflammation) in the joint. °· Wear and tear on the joints which occur as we grow older (osteoarthritis). °· Overusing the joint. °· Various forms of arthritis. °· Infections of the joint. °Regardless of the cause of pain in your joint, most of these different pains respond to anti-inflammatory drugs and rest. The exception to this is when a joint is infected, and these cases are treated with antibiotics, if it is a bacterial infection. °HOME CARE INSTRUCTIONS  °· Rest the injured area for as long as directed by your caregiver. Then slowly start using the joint as directed by your caregiver and as the pain allows. Crutches as directed may be useful if the ankles, knees or hips are involved. If the knee was splinted or casted, continue use and care as directed. If an stretchy or elastic wrapping bandage has been applied today, it should be removed and re-applied every 3 to 4 hours. It should not be applied tightly, but firmly enough to keep swelling down. Watch toes and feet for swelling, bluish discoloration, coldness, numbness or excessive pain. If any of these problems (symptoms) occur, remove the ace bandage and re-apply more loosely. If these symptoms persist, contact your caregiver or return to this location. °· For the first 24 hours, keep the injured extremity elevated on pillows while lying down. °· Apply ice for 15-20 minutes to the sore joint every couple hours while awake for the first half day. Then 03-04 times per day for the first 48 hours. Put the ice in a plastic bag and place a towel between the bag of ice and your skin. °· Wear any splinting, casting, elastic bandage applications, or slings as instructed. °· Only take over-the-counter or prescription medicines for pain, discomfort, or fever as  directed by your caregiver. Do not use aspirin immediately after the injury unless instructed by your physician. Aspirin can cause increased bleeding and bruising of the tissues. °· If you were given crutches, continue to use them as instructed and do not resume weight bearing on the sore joint until instructed. °Persistent pain and inability to use the sore joint as directed for more than 2 to 3 days are warning signs indicating that you should see a caregiver for a follow-up visit as soon as possible. Initially, a hairline fracture (break in bone) may not be evident on X-rays. Persistent pain and swelling indicate that further evaluation, non-weight bearing or use of the joint (use of crutches or slings as instructed), or further X-rays are indicated. X-rays may sometimes not show a small fracture until a week or 10 days later. Make a follow-up appointment with your own caregiver or one to whom we have referred you. A radiologist (specialist in reading X-rays) may read your X-rays. Make sure you know how you are to obtain your X-ray results. Do not assume everything is normal if you do not hear from us. °SEEK MEDICAL CARE IF: °Bruising, swelling, or pain increases. °SEEK IMMEDIATE MEDICAL CARE IF:  °· Your fingers or toes are numb or blue. °· The pain is not responding to medications and continues to stay the same or get worse. °· The pain in your joint becomes severe. °· You develop a fever over 102° F (38.9° C). °· It becomes impossible to move or use the joint. °MAKE SURE YOU:  °·   Understand these instructions. °· Will watch your condition. °· Will get help right away if you are not doing well or get worse. °Document Released: 03/02/2005 Document Revised: 05/25/2011 Document Reviewed: 10/19/2007 °ExitCare® Patient Information ©2015 [employer]. This information is not intended to replace advice given to you by your health care provider. Make sure you discuss any questions you have with your health care  provider. ° °Contusion °A contusion is a deep bruise. Contusions are the result of an injury that caused bleeding under the skin. The contusion may turn blue, purple, or yellow. Minor injuries will give you a painless contusion, but more severe contusions may stay painful and swollen for a few weeks.  °CAUSES  °A contusion is usually caused by a blow, trauma, or direct force to an area of the body. °SYMPTOMS  °· Swelling and redness of the injured area. °· Bruising of the injured area. °· Tenderness and soreness of the injured area. °· Pain. °DIAGNOSIS  °The diagnosis can be made by taking a history and physical exam. An X-ray, CT scan, or MRI may be needed to determine if there were any associated injuries, such as fractures. °TREATMENT  °Specific treatment will depend on what area of the body was injured. In general, the best treatment for a contusion is resting, icing, elevating, and applying cold compresses to the injured area. Over-the-counter medicines may also be recommended for pain control. Ask your caregiver what the best treatment is for your contusion. °HOME CARE INSTRUCTIONS  °· Put ice on the injured area. °¨ Put ice in a plastic bag. °¨ Place a towel between your skin and the bag. °¨ Leave the ice on for 15-20 minutes, 3-4 times a day, or as directed by your health care provider. °· Only take over-the-counter or prescription medicines for pain, discomfort, or fever as directed by your caregiver. Your caregiver may recommend avoiding anti-inflammatory medicines (aspirin, ibuprofen, and naproxen) for 48 hours because these medicines may increase bruising. °· Rest the injured area. °· If possible, elevate the injured area to reduce swelling. °SEEK IMMEDIATE MEDICAL CARE IF:  °· You have increased bruising or swelling. °· You have pain that is getting worse. °· Your swelling or pain is not relieved with medicines. °MAKE SURE YOU:  °· Understand these instructions. °· Will watch your condition. °· Will get  help right away if you are not doing well or get worse. °Document Released: 12/10/2004 Document Revised: 03/07/2013 Document Reviewed: 01/05/2011 °ExitCare® Patient Information ©2015 [employer]. This information is not intended to replace advice given to you by your health care provider. Make sure you discuss any questions you have with your health care provider. ° °

## 2014-10-04 ENCOUNTER — Encounter (HOSPITAL_COMMUNITY): Payer: Self-pay | Admitting: *Deleted

## 2014-10-04 ENCOUNTER — Emergency Department (HOSPITAL_COMMUNITY)
Admission: EM | Admit: 2014-10-04 | Discharge: 2014-10-04 | Disposition: A | Discharge status: Home / Self Care | Payer: Self-pay | Attending: Emergency Medicine | Admitting: Emergency Medicine

## 2014-10-04 DIAGNOSIS — K088 Other specified disorders of teeth and supporting structures: Secondary | ICD-10-CM | POA: Insufficient documentation

## 2014-10-04 DIAGNOSIS — Z79899 Other long term (current) drug therapy: Secondary | ICD-10-CM | POA: Insufficient documentation

## 2014-10-04 DIAGNOSIS — K0889 Other specified disorders of teeth and supporting structures: Secondary | ICD-10-CM

## 2014-10-04 DIAGNOSIS — Z880 Allergy status to penicillin: Secondary | ICD-10-CM | POA: Insufficient documentation

## 2014-10-04 DIAGNOSIS — Z87891 Personal history of nicotine dependence: Secondary | ICD-10-CM | POA: Insufficient documentation

## 2014-10-04 DIAGNOSIS — Z8659 Personal history of other mental and behavioral disorders: Secondary | ICD-10-CM | POA: Insufficient documentation

## 2014-10-04 MED ORDER — CLINDAMYCIN HCL 150 MG PO CAPS: 150.0000 mg | ORAL_CAPSULE | Freq: Four times a day (QID) | ORAL | Status: DC

## 2014-10-04 NOTE — ED Provider Notes (Signed)
History  This chart was scribed for non-physician practitioner, Teressa Lower, FNP,working with Elwin Mocha, MD, by Karle Plumber, ED Scribe. This patient was seen in room TR05C/TR05C and the patient's care was started at 5:33 PM.  Chief Complaint  Patient presents with  . Dental Pain   HPI  HPI Comments:  Frederick Hess is a 29 y.o. transgender male who presents to the Emergency Department complaining of severe right upper dental pain that began about three days ago. He reports associated right sided facial swelling. He denies taking anything for pain. Eating makes the pain worse. He denies alleviating factors. He denies fever, chills, nausea or vomiting. Pt reports allergy to PCN. Pt does not have a dentist.  Past Medical History  Diagnosis Date  . Hormone replacement therapy   . Transexualism    Past Surgical History  Procedure Laterality Date  . Cosmetic surgery      rhinoplasty   History reviewed. No pertinent family history. History  Substance Use Topics  . Smoking status: Former Smoker -- 1.00 packs/day  . Smokeless tobacco: Not on file  . Alcohol Use: Yes     Comment: Rarely    Review of Systems  Allergies  Amoxicillin  Home Medications   Prior to Admission medications   Medication Sig Start Date End Date Taking? Authorizing Provider  cephALEXin (KEFLEX) 500 MG capsule Take 1 capsule (500 mg total) by mouth 2 (two) times daily. 05/02/14   Kristen N Ward, DO  estradiol valerate (DELESTROGEN) 20 MG/ML injection Inject 20 mg into the muscle every 21 ( twenty-one) days.    Historical Provider, MD  estrogens, conjugated, (PREMARIN) 1.25 MG tablet Take 1.25 mg by mouth daily.    Historical Provider, MD  ibuprofen (ADVIL,MOTRIN) 600 MG tablet Take 1 tablet (600 mg total) by mouth every 6 (six) hours as needed. Patient not taking: Reported on 05/02/2014 04/28/14   Loren Racer, MD  ondansetron (ZOFRAN ODT) 4 MG disintegrating tablet Take 1 tablet (4 mg total)  by mouth every 8 (eight) hours as needed for nausea or vomiting. 05/02/14   Kristen N Ward, DO   Triage Vitals: BP 117/81 mmHg  Pulse 81  Temp(Src) 99.1 F (37.3 C) (Oral)  Resp 20  Ht 6' (1.829 m)  Wt 185 lb (83.915 kg)  BMI 25.08 kg/m2  SpO2 97% Physical Exam  Constitutional: He is oriented to person, place, and time. He appears well-developed and well-nourished.  HENT:  Right Ear: External ear normal.  Left Ear: External ear normal.  Swelling noted to the upper check. No definite dental abnormality noted  Eyes: Conjunctivae and EOM are normal. Pupils are equal, round, and reactive to light.  Cardiovascular: Normal rate and regular rhythm.   Pulmonary/Chest: Effort normal and breath sounds normal.  Musculoskeletal: Normal range of motion.  Neurological: He is alert and oriented to person, place, and time.  Nursing note and vitals reviewed.   ED Course  Procedures (including critical care time) DIAGNOSTIC STUDIES: Oxygen Saturation is 97% on RA, normal by my interpretation.   COORDINATION OF CARE: 5:37 PM- Will prescribe antibiotic and give referral to a dentist. Pt verbalizes understanding and agrees to plan.  Medications - No data to display  Labs Review Labs Reviewed - No data to display  Imaging Review No results found.   EKG Interpretation None      MDM   Final diagnoses:  Toothache    No sign of ludwigs angina. Pt allergic to pcn. Will treat with clindamycin.  Pt given dental referral  I personally performed the services described in this documentation, which was scribed in my presence. The recorded information has been reviewed and is accurate.    Teressa Lower, NP 10/04/14 1744  Teressa Lower, NP 10/04/14 1745  Elwin Mocha, MD 10/04/14 2328

## 2014-10-04 NOTE — ED Notes (Signed)
Pt c/o right upper dental pain for three days. Pt is not getting any pain relief from OTC medications.

## 2014-10-04 NOTE — Discharge Instructions (Signed)

## 2015-01-24 ENCOUNTER — Emergency Department (HOSPITAL_COMMUNITY)
Admission: EM | Admit: 2015-01-24 | Discharge: 2015-01-24 | Disposition: A | Discharge status: Home / Self Care | Payer: Self-pay | Attending: Emergency Medicine | Admitting: Emergency Medicine

## 2015-01-24 ENCOUNTER — Encounter (HOSPITAL_COMMUNITY): Payer: Self-pay | Admitting: *Deleted

## 2015-01-24 DIAGNOSIS — Z792 Long term (current) use of antibiotics: Secondary | ICD-10-CM | POA: Insufficient documentation

## 2015-01-24 DIAGNOSIS — Z87891 Personal history of nicotine dependence: Secondary | ICD-10-CM | POA: Insufficient documentation

## 2015-01-24 DIAGNOSIS — Z880 Allergy status to penicillin: Secondary | ICD-10-CM | POA: Insufficient documentation

## 2015-01-24 DIAGNOSIS — Z202 Contact with and (suspected) exposure to infections with a predominantly sexual mode of transmission: Secondary | ICD-10-CM | POA: Insufficient documentation

## 2015-01-24 MED ORDER — CEFTRIAXONE SODIUM 250 MG IJ SOLR
250.0000 mg | Freq: Once | INTRAMUSCULAR | Status: AC
  Administered 2015-01-24: 250 mg via INTRAMUSCULAR
  Filled 2015-01-24: qty 250

## 2015-01-24 MED ORDER — AZITHROMYCIN 250 MG PO TABS
1000.0000 mg | ORAL_TABLET | Freq: Once | ORAL | Status: AC
  Administered 2015-01-24: 1000 mg via ORAL
  Filled 2015-01-24: qty 4

## 2015-01-24 MED ORDER — LIDOCAINE HCL (PF) 1 % IJ SOLN
INTRAMUSCULAR | Status: AC
  Administered 2015-01-24: 0.9 mL
  Filled 2015-01-24: qty 5

## 2015-01-24 NOTE — ED Provider Notes (Signed)
CSN: [id number]     Arrival date & time 01/24/15  1127 History   First MD Initiated Contact with Patient 01/24/15 1146     Chief Complaint  Patient presents with  . Exposure to STD     (Consider location/radiation/quality/duration/timing/severity/associated sxs/prior Treatment) Patient is a 29 y.o. male presenting with STD exposure.  Exposure to STD This is a new problem. The problem occurs constantly. The problem has not changed since onset.Pertinent negatives include no chest pain, no abdominal pain, no headaches and no shortness of breath. Nothing aggravates the symptoms. Nothing relieves the symptoms.    Past Medical History  Diagnosis Date  . Hormone replacement therapy   . Transexualism    Past Surgical History  Procedure Laterality Date  . Cosmetic surgery      rhinoplasty   No family history on file. Social History  Substance Use Topics  . Smoking status: Former Smoker -- 1.00 packs/day  . Smokeless tobacco: None  . Alcohol Use: Yes     Comment: Rarely    Review of Systems  Respiratory: Negative for shortness of breath.   Cardiovascular: Negative for chest pain.  Gastrointestinal: Negative for abdominal pain.  Neurological: Negative for headaches.  All other systems reviewed and are negative.     Allergies  Amoxicillin  Home Medications   Prior to Admission medications   Medication Sig Start Date End Date Taking? Authorizing Provider  cephALEXin (KEFLEX) 500 MG capsule Take 1 capsule (500 mg total) by mouth 2 (two) times daily. 05/02/14   Kristen N Ward, DO  clindamycin (CLEOCIN) 150 MG capsule Take 1 capsule (150 mg total) by mouth every 6 (six) hours. 10/04/14   Teressa Lower, NP  estradiol valerate (DELESTROGEN) 20 MG/ML injection Inject 20 mg into the muscle every 21 ( twenty-one) days.    Historical Provider, MD  estrogens, conjugated, (PREMARIN) 1.25 MG tablet Take 1.25 mg by mouth daily.    Historical Provider, MD  ibuprofen (ADVIL,MOTRIN) 600  MG tablet Take 1 tablet (600 mg total) by mouth every 6 (six) hours as needed. Patient not taking: Reported on 05/02/2014 04/28/14   Loren Racer, MD  ondansetron (ZOFRAN ODT) 4 MG disintegrating tablet Take 1 tablet (4 mg total) by mouth every 8 (eight) hours as needed for nausea or vomiting. 05/02/14   Kristen N Ward, DO   Temp(Src) 98.5 F (36.9 C) (Oral) Physical Exam  Constitutional: He is oriented to person, place, and time. He appears well-developed and well-nourished.  HENT:  Head: Normocephalic and atraumatic.  Eyes: Conjunctivae and EOM are normal.  Neck: Normal range of motion. Neck supple.  Cardiovascular: Normal rate, regular rhythm and normal heart sounds.   Pulmonary/Chest: Effort normal and breath sounds normal. No respiratory distress.  Abdominal: He exhibits no distension. There is no tenderness. There is no rebound and no guarding.  Musculoskeletal: Normal range of motion.  Neurological: He is alert and oriented to person, place, and time.  Skin: Skin is warm and dry.  Vitals reviewed.   ED Course  Procedures (including critical care time) Labs Review Labs Reviewed - No data to display  Imaging Review No results found. I have personally reviewed and evaluated these images and lab results as part of my medical decision-making.   EKG Interpretation None      MDM   Final diagnoses:  None    29 y.o. male without pertinent PMH presents with exposure to STD and request for treatment.  Exam benign.  Rocephin/azithro given.  DC home  in stable condition.    I have reviewed all laboratory and imaging studies if ordered as above  1. Exposure to STD         Mirian MoMatthew Gentry, MD 01/25/15 [phone]

## 2015-01-24 NOTE — Discharge Instructions (Signed)
Gonorrhea Gonorrhea is an infection that can cause serious problems. If left untreated, the infection may:   Damage the male or male organs.   Cause women to be unable to have children (sterility).   Harm a fetus if the infected woman is pregnant.  It is important to get treatment for gonorrhea as soon as possible. It is also necessary that all your sexual partners be tested for the infection.  CAUSES  Gonorrhea is caused by bacteria called Neisseria gonorrhoeae. The infection is spread from person to person, usually by sexual contact (such as by anal, vaginal, or oral means). A newborn can contract the infection from his or her mother during birth.  RISK FACTORS  Being a woman younger than 31 years of age who is sexually active.  Being a woman 25 years of age or older who has:  A new sex partner.  More than one sex partner.  A sex partner who has a sexually transmitted disease (STD).  Using condoms inconsistently.  Currently having, or having previously had, an STD.  Exchanging sex or money or drugs. SYMPTOMS  Some people with gonorrhea do not have symptoms. Symptoms may be different in females and males.  Females The most common symptoms are:   Pain in the lower abdomen.   Fever with or without chills.  Other symptoms include:   Abnormal vaginal discharge.   Painful intercourse.   Burning or itching of the vagina or lips of the vagina.   Abnormal vaginal bleeding.   Pain when urinating.   Long-lasting (chronic) pain in the lower abdomen, especially during menstruation or intercourse.   Inability to become pregnant.   Going into premature labor.   Irritation, pain, bleeding, or discharge from the rectum. This may occur if the infection was spread by anal sex.   Sore throat or swollen lymph nodes in the neck. This may occur if the infection was spread by oral sex.  Males The most common symptoms are:   Discharge from the penis.   Pain  or burning during urination.   Pain or swelling in the testicles. Other symptoms may include:   Irritation, pain, bleeding, or discharge from the rectum. This may occur if the infection was spread by anal sex.   Sore throat, fever, or swollen lymph nodes in the neck. This may occur if the infection was spread by oral sex.  DIAGNOSIS  A diagnosis is made after a physical exam is done and a sample of discharge is examined under a microscope for the presence of the bacteria. The discharge may be taken from the urethra, cervix, throat, or rectum.  TREATMENT  Gonorrhea is treated with antibiotic medicines. It is important for treatment to begin as soon as possible. Early treatment may prevent some problems from developing. Do not have sex. Avoid all types of sexual activity for 7 days after treatment is complete and until any sex partners have been treated. HOME CARE INSTRUCTIONS   Take medicines only as directed by your health care provider.   Take your antibiotic medicine as directed by your health care provider. Finish the antibiotic even if you start to feel better. Incomplete treatment will put you at risk for continued infection.   Do not have sex until treatment is complete or as directed by your health care provider.   Keep all follow-up visits as directed by your health care provider.   Not all test results are available during your visit. If your test results are not back   during the visit, make an appointment with your health care provider to find out the results. Do not assume everything is normal if you have not heard from your health care provider or the medical facility. It is your responsibility to get your test results.  If you test positive for gonorrhea, inform your recent sexual partners. They need to be checked for gonorrhea even if they do not have symptoms. They may need treatment, even if they test negative for gonorrhea.  SEEK MEDICAL CARE IF:   You develop any  bad reaction to the medicine you were prescribed. This may include:   A rash.   Nausea.   Vomiting.   Diarrhea.   Your symptoms do not improve after a few days of taking antibiotics.   Your symptoms get worse.   You develop increased pain, such as in the testicles (for males) or in the abdomen (for females).  You have a fever. MAKE SURE YOU:   Understand these instructions.  Will watch your condition.  Will get help right away if you are not doing well or get worse.   This information is not intended to replace advice given to you by your health care provider. Make sure you discuss any questions you have with your health care provider.   Document Released: 02/28/2000 Document Revised: 03/23/2014 Document Reviewed: 09/07/2012 Elsevier Interactive Patient Education 2016 [employer]  

## 2015-01-24 NOTE — ED Notes (Signed)
Pt reports being told by partner that he was exposed to STD specifically for gonorrhea. Pt denies any symptoms.

## 2015-01-25 LAB — RPR: RPR Ser Ql: NONREACTIVE

## 2015-01-25 LAB — HIV ANTIBODY (ROUTINE TESTING W REFLEX): HIV SCREEN 4TH GENERATION: NONREACTIVE

## 2015-04-14 ENCOUNTER — Emergency Department (HOSPITAL_COMMUNITY): Payer: Self-pay

## 2015-04-14 ENCOUNTER — Emergency Department (HOSPITAL_COMMUNITY)
Admission: EM | Admit: 2015-04-14 | Discharge: 2015-04-14 | Disposition: A | Discharge status: Home / Self Care | Payer: Self-pay | Attending: Emergency Medicine | Admitting: Emergency Medicine

## 2015-04-14 ENCOUNTER — Encounter (HOSPITAL_COMMUNITY): Payer: Self-pay | Admitting: Emergency Medicine

## 2015-04-14 DIAGNOSIS — Z8659 Personal history of other mental and behavioral disorders: Secondary | ICD-10-CM | POA: Insufficient documentation

## 2015-04-14 DIAGNOSIS — B349 Viral infection, unspecified: Secondary | ICD-10-CM | POA: Insufficient documentation

## 2015-04-14 DIAGNOSIS — Z87891 Personal history of nicotine dependence: Secondary | ICD-10-CM | POA: Insufficient documentation

## 2015-04-14 DIAGNOSIS — Z880 Allergy status to penicillin: Secondary | ICD-10-CM | POA: Insufficient documentation

## 2015-04-14 LAB — CBC
HCT: 35.7 % — ABNORMAL LOW (ref 39.0–52.0)
Hemoglobin: 12.5 g/dL — ABNORMAL LOW (ref 13.0–17.0)
MCH: 30.9 pg (ref 26.0–34.0)
MCHC: 35 g/dL (ref 30.0–36.0)
MCV: 88.1 fL (ref 78.0–100.0)
PLATELETS: 153 10*3/uL (ref 150–400)
RBC: 4.05 MIL/uL — ABNORMAL LOW (ref 4.22–5.81)
RDW: 13.2 % (ref 11.5–15.5)
WBC: 6.7 10*3/uL (ref 4.0–10.5)

## 2015-04-14 LAB — URINALYSIS, ROUTINE W REFLEX MICROSCOPIC
Bilirubin Urine: NEGATIVE
Glucose, UA: NEGATIVE mg/dL
Hgb urine dipstick: NEGATIVE
KETONES UR: NEGATIVE mg/dL
LEUKOCYTES UA: NEGATIVE
NITRITE: NEGATIVE
PROTEIN: NEGATIVE mg/dL
Specific Gravity, Urine: 1.015 (ref 1.005–1.030)
pH: 7 (ref 5.0–8.0)

## 2015-04-14 LAB — COMPREHENSIVE METABOLIC PANEL
ALT: 14 U/L — AB (ref 17–63)
AST: 17 U/L (ref 15–41)
Albumin: 3.9 g/dL (ref 3.5–5.0)
Alkaline Phosphatase: 31 U/L — ABNORMAL LOW (ref 38–126)
Anion gap: 10 (ref 5–15)
BILIRUBIN TOTAL: 0.3 mg/dL (ref 0.3–1.2)
BUN: 8 mg/dL (ref 6–20)
CO2: 25 mmol/L (ref 22–32)
CREATININE: 0.95 mg/dL (ref 0.61–1.24)
Calcium: 9 mg/dL (ref 8.9–10.3)
Chloride: 103 mmol/L (ref 101–111)
GFR calc Af Amer: >60 mL/min (ref >60)
Glucose, Bld: 85 mg/dL (ref 65–99)
POTASSIUM: 3.8 mmol/L (ref 3.5–5.1)
Sodium: 138 mmol/L (ref 135–145)
TOTAL PROTEIN: 7.4 g/dL (ref 6.5–8.1)

## 2015-04-14 MED ORDER — IBUPROFEN 600 MG PO TABS: 600.0000 mg | ORAL_TABLET | Freq: Four times a day (QID) | ORAL | Status: DC | PRN

## 2015-04-14 MED ORDER — KETOROLAC TROMETHAMINE 30 MG/ML IJ SOLN
30.0000 mg | Freq: Once | INTRAMUSCULAR | Status: AC
  Administered 2015-04-14: 30 mg via INTRAVENOUS
  Filled 2015-04-14: qty 1

## 2015-04-14 MED ORDER — LOPERAMIDE HCL 2 MG PO CAPS: 2.0000 mg | ORAL_CAPSULE | Freq: Four times a day (QID) | ORAL | Status: DC | PRN

## 2015-04-14 MED ORDER — SODIUM CHLORIDE 0.9 % IV BOLUS (SEPSIS)
1000.0000 mL | Freq: Once | INTRAVENOUS | Status: AC
  Administered 2015-04-14: 1000 mL via INTRAVENOUS

## 2015-04-14 MED ORDER — ONDANSETRON 4 MG PO TBDP: 4.0000 mg | ORAL_TABLET | Freq: Three times a day (TID) | ORAL | Status: DC | PRN

## 2015-04-14 NOTE — Discharge Instructions (Signed)
Viral Infections °A viral infection can be caused by different types of viruses. Most viral infections are not serious and resolve on their own. However, some infections may cause severe symptoms and may lead to further complications. °SYMPTOMS °Viruses can frequently cause: °· Minor sore throat. °· Aches and pains. °· Headaches. °· Runny nose. °· Different types of rashes. °· Watery eyes. °· Tiredness. °· Cough. °· Loss of appetite. °· Gastrointestinal infections, resulting in nausea, vomiting, and diarrhea. °These symptoms do not respond to antibiotics because the infection is not caused by bacteria. However, you might catch a bacterial infection following the viral infection. This is sometimes called a "superinfection." Symptoms of such a bacterial infection may include: °· Worsening sore throat with pus and difficulty swallowing. °· Swollen neck glands. °· Chills and a high or persistent fever. °· Severe headache. °· Tenderness over the sinuses. °· Persistent overall ill feeling (malaise), muscle aches, and tiredness (fatigue). °· Persistent cough. °· Yellow, green, or brown mucus production with coughing. °HOME CARE INSTRUCTIONS  °· Only take over-the-counter or prescription medicines for pain, discomfort, diarrhea, or fever as directed by your caregiver. °· Drink enough water and fluids to keep your urine clear or pale yellow. Sports drinks can provide valuable electrolytes, sugars, and hydration. °· Get plenty of rest and maintain proper nutrition. Soups and broths with crackers or rice are fine. °SEEK IMMEDIATE MEDICAL CARE IF:  °· You have severe headaches, shortness of breath, chest pain, neck pain, or an unusual rash. °· You have uncontrolled vomiting, diarrhea, or you are unable to keep down fluids. °· You or your child has an oral temperature above 102° F (38.9° C), not controlled by medicine. °· Your baby is older than 3 months with a rectal temperature of 102° F (38.9° C) or higher. °· Your baby is 3  months old or younger with a rectal temperature of 100.4° F (38° C) or higher. °MAKE SURE YOU:  °· Understand these instructions. °· Will watch your condition. °· Will get help right away if you are not doing well or get worse. °  °This information is not intended to replace advice given to you by your health care provider. Make sure you discuss any questions you have with your health care provider. °  °Document Released: 12/10/2004 Document Revised: 05/25/2011 Document Reviewed: 08/08/2014 °Elsevier Interactive Patient Education ©2016 [employer] ° °

## 2015-04-14 NOTE — ED Provider Notes (Signed)
CSN: [id number]     Arrival date & time 04/14/15  0126 History  By signing my name below, I, Jarvis Morgan, attest that this documentation has been prepared under the direction and in the presence of Shon Baton, MD. Electronically Signed: Jarvis Morgan, ED Scribe. 04/14/2015. 2:16 AM.    Chief Complaint  Patient presents with  . Chills  . Diarrhea   The history is provided by the patient. No language interpreter was used.    HPI Comments: Frederick Hess is a 30 y.o. male who presents to the Emergency Department complaining of intermittent, moderate, diarrhea for 2 days. Pt reports associated subjective fever and chills, cough, diaphoresis and fatigue. Pt is currently on hormone replacement therapy and is transitioning to become male; pt prefers to be called Frederick Hess. She states she has been taking Nyquil, Dayquil and Alka Seltzer Plus Cold and Flu with no significant relief. She denies any sick contacts. Pt has not been eating and drinking normally. She denies any vomiting, abdominal pain or other associated symptoms.  Past Medical History  Diagnosis Date  . Hormone replacement therapy   . Transexualism    Past Surgical History  Procedure Laterality Date  . Cosmetic surgery      rhinoplasty  . Rhinoplasty     No family history on file. Social History  Substance Use Topics  . Smoking status: Former Smoker -- 1.00 packs/day  . Smokeless tobacco: None  . Alcohol Use: Yes     Comment: Rarely    Review of Systems  Constitutional: Positive for fever, chills, diaphoresis and fatigue.  Respiratory: Positive for cough.   Cardiovascular: Negative for chest pain.  Gastrointestinal: Positive for diarrhea. Negative for vomiting and abdominal pain.  Skin: Negative for rash.  All other systems reviewed and are negative.     Allergies  Amoxicillin  Home Medications   Prior to Admission medications   Medication Sig Start Date End Date Taking? Authorizing Provider   cephALEXin (KEFLEX) 500 MG capsule Take 1 capsule (500 mg total) by mouth 2 (two) times daily. Patient not taking: Reported on 04/14/2015 05/02/14   Kristen N Ward, DO  clindamycin (CLEOCIN) 150 MG capsule Take 1 capsule (150 mg total) by mouth every 6 (six) hours. Patient not taking: Reported on 04/14/2015 10/04/14   Teressa Lower, NP  ibuprofen (ADVIL,MOTRIN) 600 MG tablet Take 1 tablet (600 mg total) by mouth every 6 (six) hours as needed. 04/14/15   Shon Baton, MD  loperamide (IMODIUM) 2 MG capsule Take 1 capsule (2 mg total) by mouth 4 (four) times daily as needed for diarrhea or loose stools. 04/14/15   Shon Baton, MD  ondansetron (ZOFRAN ODT) 4 MG disintegrating tablet Take 1 tablet (4 mg total) by mouth every 8 (eight) hours as needed for nausea or vomiting. 04/14/15   Shon Baton, MD   BP 125/83 mmHg  Pulse 65  Temp(Src) 97.8 F (36.6 C) (Oral)  Resp 18  Wt 195 lb 8 oz (88.678 kg)  SpO2 99% Physical Exam  Constitutional: He is oriented to person, place, and time. He appears well-developed and well-nourished. No distress.  HENT:  Head: Normocephalic and atraumatic.  Mouth/Throat: Oropharynx is clear and moist. No oropharyngeal exudate.  Uvula midline, no tonsillar exudate or enlargement noted  Eyes: Pupils are equal, round, and reactive to light.  Neck: Neck supple.  Cardiovascular: Normal rate, regular rhythm and normal heart sounds.   No murmur heard. Pulmonary/Chest: Effort normal and breath sounds normal.  No respiratory distress. He has no wheezes.  Abdominal: Soft. Bowel sounds are normal. There is no tenderness. There is no rebound.  Musculoskeletal: He exhibits no edema.  Lymphadenopathy:    He has no cervical adenopathy.  Neurological: He is alert and oriented to person, place, and time.  Skin: Skin is warm and dry.  Psychiatric: He has a normal mood and affect.  Nursing note and vitals reviewed.   ED Course  Procedures (including critical care  time)  DIAGNOSTIC STUDIES: Oxygen Saturation is 99% on RA, normal by my interpretation.    COORDINATION OF CARE: 2:17 AM- Will order diagnostic lab work and CXR. Pt advised of plan for treatment and pt agrees.    Labs Review Labs Reviewed  COMPREHENSIVE METABOLIC PANEL - Abnormal; Notable for the following:    ALT 14 (*)    Alkaline Phosphatase 31 (*)    All other components within normal limits  CBC - Abnormal; Notable for the following:    RBC 4.05 (*)    Hemoglobin 12.5 (*)    HCT 35.7 (*)    All other components within normal limits  URINALYSIS, ROUTINE W REFLEX MICROSCOPIC (NOT AT [facility])    Imaging Review Dg Chest 2 View  04/14/2015  CLINICAL DATA:  Acute onset of cough and diarrhea. Initial encounter. EXAM: CHEST  2 VIEW COMPARISON:  Chest radiograph performed 05/12/2013 FINDINGS: The lungs are well-aerated and clear. There is no evidence of focal opacification, pleural effusion or pneumothorax. The heart is normal in size; the mediastinal contour is within normal limits. No acute osseous abnormalities are seen. IMPRESSION: No acute cardiopulmonary process seen. Electronically Signed   By: Roanna Raider M.D.   On: 04/14/2015 02:47   I have personally reviewed and evaluated these lab results as part of my medical decision-making.   EKG Interpretation None      MDM   Final diagnoses:  Viral syndrome    Patient presents with upper respiratory symptoms, diarrhea, and myalgia. Nontoxic on exam. Afebrile. Exam is benign. Basic labwork is reassuring and chest x-ray shows no evidence of pneumonia. Patient was given fluids and Toradol for myalgias. She is able to tolerate food without difficulty. Discussed with patient continued supportive care at home. Suspect acute viral illness.  After history, exam, and medical workup I feel the patient has been appropriately medically screened and is safe for discharge home. Pertinent diagnoses were discussed with the patient. Patient was  given return precautions.  I personally performed the services described in this documentation, which was scribed in my presence. The recorded information has been reviewed and is accurate.     Shon Baton, MD 04/14/15 [phone]

## 2015-04-14 NOTE — ED Notes (Signed)
Pt. reports chills , diaphoresis , diarrhea and fatigue onset this week .

## 2015-08-07 ENCOUNTER — Encounter (HOSPITAL_COMMUNITY): Payer: Self-pay | Admitting: *Deleted

## 2015-08-07 ENCOUNTER — Ambulatory Visit (HOSPITAL_COMMUNITY): Admission: EM | Admit: 2015-08-07 | Discharge: 2015-08-07 | Discharge status: Absent without leave | Payer: Self-pay

## 2015-08-07 ENCOUNTER — Ambulatory Visit (HOSPITAL_COMMUNITY)
Admission: EM | Admit: 2015-08-07 | Discharge: 2015-08-07 | Disposition: A | Discharge status: Home / Self Care | Payer: Self-pay | Attending: Family Medicine | Admitting: Family Medicine

## 2015-08-07 DIAGNOSIS — M26621 Arthralgia of right temporomandibular joint: Secondary | ICD-10-CM

## 2015-08-07 DIAGNOSIS — R591 Generalized enlarged lymph nodes: Secondary | ICD-10-CM

## 2015-08-07 DIAGNOSIS — R599 Enlarged lymph nodes, unspecified: Secondary | ICD-10-CM

## 2015-08-07 MED ORDER — CLINDAMYCIN HCL 150 MG PO CAPS: 150.0000 mg | ORAL_CAPSULE | Freq: Three times a day (TID) | ORAL | Status: DC

## 2015-08-07 NOTE — ED Notes (Signed)
Grinds  Teeth   At  Night  Swollen r  Side  Jaw   Hurts  To   Swallow       Lymph  Node  Tender

## 2015-08-07 NOTE — Discharge Instructions (Signed)

## 2015-08-07 NOTE — ED Provider Notes (Signed)
CSN: [id number]     Arrival date & time 08/07/15  1335 History   First MD Initiated Contact with Patient 08/07/15 1419     Chief Complaint  Patient presents with  . Sore Throat   (Consider location/radiation/quality/duration/timing/severity/associated sxs/prior Treatment) HPI History obtained from patient:  Pt presents with the cc of:  Right jaw neck pain Duration of symptoms: Almost a week Treatment prior to arrival: Symptomatic treatment at home Context: States that she grinds her teeth at night and has jaw pain now. Other symptoms include: Swollen painful lymph node Pain score: 3 FAMILY HISTORY: No history of cancer    Past Medical History  Diagnosis Date  . Hormone replacement therapy   . Transexualism    Past Surgical History  Procedure Laterality Date  . Cosmetic surgery      rhinoplasty  . Rhinoplasty     History reviewed. No pertinent family history. Social History  Substance Use Topics  . Smoking status: Former Smoker -- 1.00 packs/day  . Smokeless tobacco: None  . Alcohol Use: Yes     Comment: Rarely    Review of Systems  Denies: HEADACHE, NAUSEA, ABDOMINAL PAIN, CHEST PAIN, CONGESTION, DYSURIA, SHORTNESS OF BREATH  Allergies  Amoxicillin  Home Medications   Prior to Admission medications   Medication Sig Start Date End Date Taking? Authorizing Provider  cephALEXin (KEFLEX) 500 MG capsule Take 1 capsule (500 mg total) by mouth 2 (two) times daily. Patient not taking: Reported on 04/14/2015 05/02/14   Kristen N Ward, DO  clindamycin (CLEOCIN) 150 MG capsule Take 1 capsule (150 mg total) by mouth every 6 (six) hours. Patient not taking: Reported on 04/14/2015 10/04/14   Teressa LowerVrinda Pickering, NP  clindamycin (CLEOCIN) 150 MG capsule Take 1 capsule (150 mg total) by mouth 3 (three) times daily. 08/07/15   Tharon AquasFrank C Feleica Fulmore, PA  ibuprofen (ADVIL,MOTRIN) 600 MG tablet Take 1 tablet (600 mg total) by mouth every 6 (six) hours as needed. 04/14/15   Shon Batonourtney F Horton, MD   loperamide (IMODIUM) 2 MG capsule Take 1 capsule (2 mg total) by mouth 4 (four) times daily as needed for diarrhea or loose stools. 04/14/15   Shon Batonourtney F Horton, MD  ondansetron (ZOFRAN ODT) 4 MG disintegrating tablet Take 1 tablet (4 mg total) by mouth every 8 (eight) hours as needed for nausea or vomiting. 04/14/15   Shon Batonourtney F Horton, MD   Meds Ordered and Administered this Visit  Medications - No data to display  BP 129/85 mmHg  Pulse 68  Temp(Src) 99.7 F (37.6 C) (Oral)  Resp 20  SpO2 99% No data found.   Physical Exam NURSES NOTES AND VITAL SIGNS REVIEWED. CONSTITUTIONAL: Well developed, well nourished, no acute distress HEENT: normocephalic, atraumatic, there is minimal tenderness noted the right side of the neck anterior cervical chain. There is a small node noted there. Mouth exam is in good repair. No abscess or dental caries appreciated. EYES: Conjunctiva normal NECK:normal ROM, supple, no adenopathy PULMONARY:No respiratory distress, normal effort ABDOMINAL: Soft, ND, NT BS+, No CVAT MUSCULOSKELETAL: Normal ROM of all extremities,  SKIN: warm and dry without rash PSYCHIATRIC: Mood and affect, behavior are normal  ED Course  Procedures (including critical care time)  Labs Review Labs Reviewed - No data to display  Imaging Review No results found.   Visual Acuity Review  Right Eye Distance:   Left Eye Distance:   Bilateral Distance:    Right Eye Near:   Left Eye Near:    Bilateral Near:  Prescription for clindamycin as provided. Application of heat compresses to the lymph node is advised. Follow-up with the dentist of her choice for a bite block for grinding of her teeth.   MDM   1. Arthralgia of right temporomandibular joint   2. Lymph node enlargement     Patient is reassured that there are no issues that require transfer to higher level of care at this time or additional tests. Patient is advised to continue home symptomatic  treatment. Patient is advised that if there are new or worsening symptoms to attend the emergency department, contact primary care provider, or return to UC. Instructions of care provided discharged home in stable condition.    THIS NOTE WAS GENERATED USING A VOICE RECOGNITION SOFTWARE PROGRAM. ALL REASONABLE EFFORTS  WERE MADE TO PROOFREAD THIS DOCUMENT FOR ACCURACY.  I have verbally reviewed the discharge instructions with the patient. A printed AVS was given to the patient.  All questions were answered prior to discharge.      Tharon Aquas, PA 08/07/15 2029

## 2016-01-13 ENCOUNTER — Encounter (HOSPITAL_COMMUNITY): Payer: Self-pay | Admitting: *Deleted

## 2016-01-13 ENCOUNTER — Emergency Department (HOSPITAL_COMMUNITY): Payer: Self-pay

## 2016-01-13 ENCOUNTER — Emergency Department (HOSPITAL_COMMUNITY)
Admission: EM | Admit: 2016-01-13 | Discharge: 2016-01-13 | Disposition: A | Discharge status: Home / Self Care | Payer: Self-pay | Attending: Emergency Medicine | Admitting: Emergency Medicine

## 2016-01-13 DIAGNOSIS — J069 Acute upper respiratory infection, unspecified: Secondary | ICD-10-CM | POA: Insufficient documentation

## 2016-01-13 DIAGNOSIS — Z87891 Personal history of nicotine dependence: Secondary | ICD-10-CM | POA: Insufficient documentation

## 2016-01-13 MED ORDER — BENZONATATE 100 MG PO CAPS: 100.0000 mg | ORAL_CAPSULE | Freq: Three times a day (TID) | ORAL | 0 refills | Status: DC

## 2016-01-13 NOTE — ED Notes (Signed)
EDP at bedside  

## 2016-01-13 NOTE — Discharge Instructions (Signed)
We believe your persistent cough is a result of a viral syndrome for which your symptoms have still not quite resolved.  Sometimes it takes many weeks to completely go away, especially if you smoke or have chronic lung problems.  Please take any medications prescribed and follow up as recommended with your regular doctor.  We also sent a test to the lab for HIV. If this test comes back positive someone will call you in the coming days to tell you about your results.   If you develop any new or worsening symptoms, including but not limited to fever, persistent vomiting, worsening shortness of breath, or other symptoms that concern you, please return to the Emergency Department immediately.

## 2016-01-13 NOTE — ED Provider Notes (Signed)
Emergency Department Provider Note   I have reviewed the triage vital signs and the nursing notes.   HISTORY  Chief Complaint URI   HPI Frederick Hess is a 30 y.o. male (presenting as male going by "Frederick Hess")with PMH of hormone replacement therapy presents emergency room for evaluation of 4 days of cough, body aches, headache, and weight loss. The patient denies any known sick contacts. She notes that her cough is slightly productive but no hemoptysis. She has not recorded fevers at home. No abdominal discomfort. No associated vomiting or diarrhea. She treats her weight loss to not feeling like eating. She denies recent HIV testing. No other exacerbating or alleviating factors. She dispense or weight loss to be 6-7 pounds during the course of this illness.    Past Medical History:  Diagnosis Date  . Hormone replacement therapy   . Transexualism     Patient Active Problem List   Diagnosis Date Noted  . Depression (emotion) 10/30/2013  . 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 Substance abuse 10/10/2013  . Borderline personality disorder 10/10/2013    Past Surgical History:  Procedure Laterality Date  . COSMETIC SURGERY     rhinoplasty  . RHINOPLASTY      Current Outpatient Rx  . Order #: [id number] Class: Print  . Order #: [id number] Class: Print  . Order #: [id number] Class: Print  . Order #: [id number] Class: Normal  . Order #: [id number] Class: Print  . Order #: [id number] Class: Print  . Order #: [id number] Class: Print    Allergies Amoxicillin  No family history on file.  Social History Social History  Substance Use Topics  . Smoking status: Former Smoker    Packs/day: 1.00  . Smokeless tobacco: Never Used  . Alcohol use Yes     Comment: Rarely    Review of Systems  Constitutional: No fever. Positive chills. Positive body aches.  Eyes: No visual changes. ENT: No sore throat. Cardiovascular: Denies chest  pain. Respiratory: Denies shortness of breath. Positive productive cough.  Gastrointestinal: No abdominal pain. No nausea, no vomiting.  No diarrhea.  No constipation. Genitourinary: Negative for dysuria. Musculoskeletal: Negative for back pain. Skin: Negative for rash. Neurological: Negative for focal weakness or numbness. Positive diffuse HA.   10-point ROS otherwise negative.  ____________________________________________   PHYSICAL EXAM:  VITAL SIGNS: ED Triage Vitals  Enc Vitals Group     BP 01/13/16 0856 140/92     Pulse Rate 01/13/16 0856 68     Resp 01/13/16 0856 18     Temp 01/13/16 0856 97.9 F (36.6 C)     Temp Source 01/13/16 0856 Oral     SpO2 01/13/16 0856 100 %     Weight 01/13/16 0856 190 lb (86.2 kg)     Height 01/13/16 0856 6\' 1"  (1.854 m)     Pain Score 01/13/16 0906 0   Constitutional: Alert and oriented. Well appearing and in no acute distress. Eyes: Conjunctivae are normal Head: Atraumatic. Nose: No congestion/rhinnorhea. Mouth/Throat: Mucous membranes are moist.  Oropharynx non-erythematous. Neck: No stridor.  No meningeal signs.   Cardiovascular: Normal rate, regular rhythm. Good peripheral circulation. Grossly normal heart sounds.   Respiratory: Normal respiratory effort.  No retractions. Lungs CTAB. Gastrointestinal: Soft and nontender. No distention.  Musculoskeletal: No lower extremity tenderness nor edema. No gross deformities of extremities. Neurologic:  Normal speech and language. No gross focal neurologic deficits are appreciated.  Skin:  Skin is warm, dry and intact. No rash noted.  ____________________________________________   LABS (all labs ordered are listed, but only abnormal results are displayed)  Labs Reviewed  HIV ANTIBODY (ROUTINE TESTING)   ____________________________________________  RADIOLOGY  Dg Chest 2 View  Result Date: 01/13/2016 CLINICAL DATA:  Shortness of breath with cough and chest congestion and pain for  the past 2 days, current smoker EXAM: CHEST  2 VIEW COMPARISON:  PA and lateral chest x-ray of April 14, 2015 FINDINGS: The lungs are adequately inflated. There is no focal infiltrate. There is no pleural effusion or pneumothorax. The heart and pulmonary vascularity are normal. The mediastinum is normal in width. The bony thorax exhibits no acute abnormality. IMPRESSION: There is no active cardiopulmonary disease. Electronically Signed   By: David  SwazilandJordan M.D.   On: 01/13/2016 10:28    ____________________________________________   PROCEDURES  Procedure(s) performed:   Procedures  None ____________________________________________   INITIAL IMPRESSION / ASSESSMENT AND PLAN / ED COURSE  Pertinent labs & imaging results that were available during my care of the patient were reviewed by me and considered in my medical decision making (see chart for details).  Patient resents emergency room in for evaluation of URI symptoms for the last 4 days. She has associated weight loss and productive cough. Patient is in a high risk group for HIV and has not been tested recently. Plan for HIV antibody testing. Discussed the thought process behind this with the patient should be contacted at a later date if positive. Also plan for plain film of the chest given the productive nature of her cough and weight loss. Exam is otherwise unremarkable. Patient denies headache at this time. No evidence of meningismus or infectious etiology for her headache. If negative plan for supportive measures at home and primary care physician follow-up.  10:35 AM Chest x-ray negative for pneumonia or other infiltrate. Plan for discharge with supportive care. Discussed return precautions and follow-up plan with the patient in detail.  At this time, I do not feel there is any life-threatening condition present. I have reviewed and discussed all results (EKG, imaging, lab, urine as appropriate), exam findings with patient. I have  reviewed nursing notes and appropriate previous records.  I feel the patient is safe to be discharged home without further emergent workup. Discussed usual and customary return precautions. Patient and family (if present) verbalize understanding and are comfortable with this plan.  Patient will follow-up with their primary care provider. If they do not have a primary care provider, information for follow-up has been provided to them. All questions have been answered.  ____________________________________________  FINAL CLINICAL IMPRESSION(S) / ED DIAGNOSES  Final diagnoses:  Viral upper respiratory tract infection     MEDICATIONS GIVEN DURING THIS VISIT:  None  NEW OUTPATIENT MEDICATIONS STARTED DURING THIS VISIT:  New Prescriptions   BENZONATATE (TESSALON) 100 MG CAPSULE    Take 1 capsule (100 mg total) by mouth every 8 (eight) hours.      Note:  This document was prepared using Dragon voice recognition software and may include unintentional dictation errors.  Alona BeneJoshua Eilene Voigt, MD Emergency Medicine   Maia PlanJoshua G Desman Polak, MD 01/13/16 [id number]

## 2016-01-13 NOTE — ED Triage Notes (Signed)
Pt, who likes to be referred to as Frederick Hess, to ED c/o cough, sob, earaches, sweats, weakness x 3 days.

## 2016-01-14 ENCOUNTER — Encounter (HOSPITAL_COMMUNITY): Payer: Self-pay | Admitting: Emergency Medicine

## 2016-01-14 ENCOUNTER — Emergency Department (HOSPITAL_COMMUNITY)
Admission: EM | Admit: 2016-01-14 | Discharge: 2016-01-14 | Disposition: A | Discharge status: Home / Self Care | Payer: Self-pay | Attending: Emergency Medicine | Admitting: Emergency Medicine

## 2016-01-14 DIAGNOSIS — Z87891 Personal history of nicotine dependence: Secondary | ICD-10-CM | POA: Insufficient documentation

## 2016-01-14 DIAGNOSIS — R6889 Other general symptoms and signs: Secondary | ICD-10-CM

## 2016-01-14 DIAGNOSIS — M791 Myalgia: Secondary | ICD-10-CM | POA: Insufficient documentation

## 2016-01-14 DIAGNOSIS — R110 Nausea: Secondary | ICD-10-CM | POA: Insufficient documentation

## 2016-01-14 DIAGNOSIS — R5383 Other fatigue: Secondary | ICD-10-CM | POA: Insufficient documentation

## 2016-01-14 DIAGNOSIS — R42 Dizziness and giddiness: Secondary | ICD-10-CM | POA: Insufficient documentation

## 2016-01-14 DIAGNOSIS — R51 Headache: Secondary | ICD-10-CM | POA: Insufficient documentation

## 2016-01-14 LAB — URINALYSIS, ROUTINE W REFLEX MICROSCOPIC
BILIRUBIN URINE: NEGATIVE
GLUCOSE, UA: NEGATIVE mg/dL
KETONES UR: NEGATIVE mg/dL
Leukocytes, UA: NEGATIVE
Nitrite: NEGATIVE
PROTEIN: NEGATIVE mg/dL
Specific Gravity, Urine: 1.015 (ref 1.005–1.030)
pH: 6.5 (ref 5.0–8.0)

## 2016-01-14 LAB — COMPREHENSIVE METABOLIC PANEL
ALT: 11 U/L — AB (ref 17–63)
AST: 14 U/L — AB (ref 15–41)
Albumin: 3.8 g/dL (ref 3.5–5.0)
Alkaline Phosphatase: 30 U/L — ABNORMAL LOW (ref 38–126)
Anion gap: 7 (ref 5–15)
BUN: 7 mg/dL (ref 6–20)
CHLORIDE: 108 mmol/L (ref 101–111)
CO2: 24 mmol/L (ref 22–32)
CREATININE: 0.86 mg/dL (ref 0.61–1.24)
Calcium: 8.9 mg/dL (ref 8.9–10.3)
GFR calc Af Amer: >60 mL/min (ref >60)
GLUCOSE: 106 mg/dL — AB (ref 65–99)
Potassium: 3.3 mmol/L — ABNORMAL LOW (ref 3.5–5.1)
Sodium: 139 mmol/L (ref 135–145)
Total Bilirubin: 0.6 mg/dL (ref 0.3–1.2)
Total Protein: 6.9 g/dL (ref 6.5–8.1)

## 2016-01-14 LAB — CBC
HCT: 36.4 % — ABNORMAL LOW (ref 39.0–52.0)
Hemoglobin: 12.9 g/dL — ABNORMAL LOW (ref 13.0–17.0)
MCH: 31.1 pg (ref 26.0–34.0)
MCHC: 35.4 g/dL (ref 30.0–36.0)
MCV: 87.7 fL (ref 78.0–100.0)
PLATELETS: 170 10*3/uL (ref 150–400)
RBC: 4.15 MIL/uL — ABNORMAL LOW (ref 4.22–5.81)
RDW: 13.4 % (ref 11.5–15.5)
WBC: 6.1 10*3/uL (ref 4.0–10.5)

## 2016-01-14 LAB — URINE MICROSCOPIC-ADD ON
Bacteria, UA: NONE SEEN
WBC, UA: NONE SEEN WBC/hpf (ref 0–5)

## 2016-01-14 LAB — GC/CHLAMYDIA PROBE AMP (~~LOC~~) NOT AT ARMC
Chlamydia: NEGATIVE
Neisseria Gonorrhea: NEGATIVE

## 2016-01-14 LAB — I-STAT CG4 LACTIC ACID, ED: Lactic Acid, Venous: 0.85 mmol/L (ref 0.5–1.9)

## 2016-01-14 LAB — HIV ANTIBODY (ROUTINE TESTING W REFLEX): HIV Screen 4th Generation wRfx: NONREACTIVE

## 2016-01-14 MED ORDER — ONDANSETRON HCL 4 MG PO TABS: 4.0000 mg | ORAL_TABLET | Freq: Four times a day (QID) | ORAL | 0 refills | Status: DC

## 2016-01-14 NOTE — ED Notes (Signed)
Pt prefers to be called by Frederick Hess.

## 2016-01-14 NOTE — ED Triage Notes (Signed)
Patient arrives with complaint of diffuse body pain and malaise. States she feels dizzy and lightheaded too. Endorses nausea also. Onset 1 week ago. Endorses subjective fevers. States took headache relief medication a few hours ago.

## 2016-01-14 NOTE — Discharge Instructions (Signed)
Make sure to drink plenty of fluids. Rest. Try protein shake. Follow up with family doctor if not improving. Zofran for nausea. Tylenol or motrin for body aches. Return as needed.

## 2016-01-14 NOTE — ED Provider Notes (Signed)
[facility]-EMERGENCY DEPT Provider Note   CSN: [id number] Arrival date & time: 01/14/16  0421     History   Chief Complaint Chief Complaint  Patient presents with  . Generalized Body Aches  . Nausea  . Fatigue    HPI Frederick Hess is a 30 y.o. male.  HPI Frederick Hess is a 30 y.o. male on hormone replacement therapy, presents to ED with complaint of body aches, left ear pain, congestion, cough. Symptoms for about a week. Pt reports no appetite, nausea, believes she lost about 10lb in a week. Denies urinary symptoms. Has tried theraflu with no relief. Pt states " I feel like something is really wrong with me." Pt was seen yesterday for the same. Had negative CXR, negative HIV test. Was given tessalon perles. States "they did nothing for me." Pt is on hormone replacement, states she has not had any changes in dosages. She did not take her temp at home, but believes she has had fever, because "i keep having sweats and chills." Has not had flu shot.   Past Medical History:  Diagnosis Date  . Hormone replacement therapy   . Transexualism     Patient Active Problem List   Diagnosis Date Noted  . Depression (emotion) 10/30/2013  . 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 Substance abuse 10/10/2013  . Borderline personality disorder 10/10/2013    Past Surgical History:  Procedure Laterality Date  . COSMETIC SURGERY     rhinoplasty  . RHINOPLASTY      OB History    No data available       Home Medications    Prior to Admission medications   Medication Sig Start Date End Date Taking? Authorizing Provider  benzonatate (TESSALON) 100 MG capsule Take 1 capsule (100 mg total) by mouth every 8 (eight) hours. 01/13/16   Maia PlanJoshua G Long, MD  cephALEXin (KEFLEX) 500 MG capsule Take 1 capsule (500 mg total) by mouth 2 (two) times daily. Patient not taking: Reported on 04/14/2015 05/02/14   Kristen N Ward, DO  clindamycin (CLEOCIN) 150 MG  capsule Take 1 capsule (150 mg total) by mouth every 6 (six) hours. Patient not taking: Reported on 04/14/2015 10/04/14   Teressa LowerVrinda Pickering, NP  clindamycin (CLEOCIN) 150 MG capsule Take 1 capsule (150 mg total) by mouth 3 (three) times daily. 08/07/15   Tharon AquasFrank C Patrick, PA  ibuprofen (ADVIL,MOTRIN) 600 MG tablet Take 1 tablet (600 mg total) by mouth every 6 (six) hours as needed. 04/14/15   Shon Batonourtney F Horton, MD  loperamide (IMODIUM) 2 MG capsule Take 1 capsule (2 mg total) by mouth 4 (four) times daily as needed for diarrhea or loose stools. 04/14/15   Shon Batonourtney F Horton, MD  ondansetron (ZOFRAN ODT) 4 MG disintegrating tablet Take 1 tablet (4 mg total) by mouth every 8 (eight) hours as needed for nausea or vomiting. 04/14/15   Shon Batonourtney F Horton, MD    Family History History reviewed. No pertinent family history.  Social History Social History  Substance Use Topics  . Smoking status: Former Smoker    Packs/day: 1.00  . Smokeless tobacco: Never Used  . Alcohol use Yes     Comment: Rarely     Allergies   Amoxicillin   Review of Systems Review of Systems  Constitutional: Positive for appetite change, chills and fatigue.  HENT: Positive for congestion and ear pain.   Respiratory: Positive for cough. Negative for chest tightness and shortness of breath.  Cardiovascular: Negative for chest pain, palpitations and leg swelling.  Gastrointestinal: Positive for nausea. Negative for abdominal distention, abdominal pain, diarrhea and vomiting.  Genitourinary: Negative for dysuria, frequency, hematuria and urgency.  Musculoskeletal: Positive for arthralgias. Negative for myalgias, neck pain and neck stiffness.  Skin: Negative for rash.  Allergic/Immunologic: Negative for immunocompromised state.  Neurological: Positive for dizziness and headaches. Negative for weakness, light-headedness and numbness.  All other systems reviewed and are negative.    Physical Exam Updated Vital Signs BP  137/74   Pulse (!) 58   Temp 98 F (36.7 C) (Oral)   Resp 24   Ht 6' (1.829 m)   Wt 87.5 kg   SpO2 99%   BMI 26.18 kg/m   Physical Exam  Constitutional: He is oriented to person, place, and time. He appears well-developed and well-nourished. No distress.  HENT:  Head: Normocephalic and atraumatic.  Right Ear: External ear normal.  Left Ear: External ear normal.  Nose: Nose normal.  Mouth/Throat: Oropharynx is clear and moist.  TMs normal bialterally  Eyes: Conjunctivae are normal.  Neck: Neck supple.  Cardiovascular: Normal rate, regular rhythm and normal heart sounds.   Pulmonary/Chest: Effort normal. No respiratory distress. He has no wheezes. He has no rales.  Abdominal: Soft. Bowel sounds are normal. He exhibits no distension. There is no tenderness. There is no rebound.  Musculoskeletal: He exhibits no edema.  Neurological: He is alert and oriented to person, place, and time.  Skin: Skin is warm and dry.  Nursing note and vitals reviewed.    ED Treatments / Results  Labs (all labs ordered are listed, but only abnormal results are displayed) Labs Reviewed  COMPREHENSIVE METABOLIC PANEL - Abnormal; Notable for the following:       Result Value   Potassium 3.3 (*)    Glucose, Bld 106 (*)    AST 14 (*)    ALT 11 (*)    Alkaline Phosphatase 30 (*)    All other components within normal limits  CBC - Abnormal; Notable for the following:    RBC 4.15 (*)    Hemoglobin 12.9 (*)    HCT 36.4 (*)    All other components within normal limits  URINALYSIS, ROUTINE W REFLEX MICROSCOPIC (NOT AT [facility]) - Abnormal; Notable for the following:    Hgb urine dipstick SMALL (*)    All other components within normal limits  URINE MICROSCOPIC-ADD ON - Abnormal; Notable for the following:    Squamous Epithelial / LPF 0-5 (*)    All other components within normal limits  I-STAT CG4 LACTIC ACID, ED    EKG  EKG Interpretation None       Radiology Dg Chest 2 View  Result Date:  01/13/2016 CLINICAL DATA:  Shortness of breath with cough and chest congestion and pain for the past 2 days, current smoker EXAM: CHEST  2 VIEW COMPARISON:  PA and lateral chest x-ray of April 14, 2015 FINDINGS: The lungs are adequately inflated. There is no focal infiltrate. There is no pleural effusion or pneumothorax. The heart and pulmonary vascularity are normal. The mediastinum is normal in width. The bony thorax exhibits no acute abnormality. IMPRESSION: There is no active cardiopulmonary disease. Electronically Signed   By: David  [place] M.D.   On: 01/13/2016 10:28    Procedures Procedures (including critical care time)  Medications Ordered in ED Medications - No data to display   Initial Impression / Assessment and Plan / ED Course  I have reviewed  the triage vital signs and the nursing notes.  Pertinent labs & imaging results that were available during my care of the patient were reviewed by me and considered in my medical decision making (see chart for details).  Clinical Course   Patient is well-appearing. VS all within normal. CXR, HIV yesterday normal. Labs today unremarkable. Most likely viral syndrome, possibly influenza. Discussed all lab work and findings with pt.  At this time pt is stable for dc home with close outpatient follow up. Will give zofran for nausea. Encouraged fluids, protein shake, return if worsening. Pt voiced understanding.   Vitals:   01/14/16 0600 01/14/16 0615 01/14/16 0630 01/14/16 0645  BP: 126/86 140/79 137/74 126/67  Pulse: (!) 58 66 (!) 58 62  Resp:      Temp:      TempSrc:      SpO2: 98% 100% 99% 100%  Weight:      Height:         Final Clinical Impressions(s) / ED Diagnoses   Final diagnoses:  Flu-like symptoms     New Prescriptions New Prescriptions   ONDANSETRON (ZOFRAN) 4 MG TABLET    Take 1 tablet (4 mg total) by mouth every 6 (six) hours.     Jaynie Crumbleatyana Salene Mohamud, PA-C 01/14/16 [id number]    Azalia BilisKevin Campos, MD 01/15/16  [phone]

## 2016-01-20 ENCOUNTER — Ambulatory Visit (HOSPITAL_COMMUNITY)
Admission: EM | Admit: 2016-01-20 | Discharge: 2016-01-20 | Disposition: A | Discharge status: Home / Self Care | Payer: Self-pay | Attending: Family Medicine | Admitting: Family Medicine

## 2016-01-20 ENCOUNTER — Encounter (HOSPITAL_COMMUNITY): Payer: Self-pay | Admitting: Emergency Medicine

## 2016-01-20 DIAGNOSIS — R531 Weakness: Secondary | ICD-10-CM

## 2016-01-20 DIAGNOSIS — R232 Flushing: Secondary | ICD-10-CM

## 2016-01-20 NOTE — ED Provider Notes (Signed)
CSN: [id number]     Arrival date & time 01/20/16  1613 History   First MD Initiated Contact with Patient 01/20/16 1709     Chief Complaint  Patient presents with  . Weakness   (Consider location/radiation/quality/duration/timing/severity/associated sxs/prior Treatment) HPI Frederick Hess is a 30 y.o. male currently on hormone replacement therapy presenting to UC with c/o generalized weakness with episodes of near syncope and profuse sweating.  [facility] but states labs they did were normal. Pt is wondering if additional labs can be done here.  Pt does not currently have a PCP but notes she has a follow up with a hormone specialist on 11/28.  She was hoping to get answers before then.  Denies change in hormone treatments but states the therapy is not managed by a medical provider. Denies chest pain or SOB. Denies abdominal pain, n/v/d.    Past Medical History:  Diagnosis Date  . Hormone replacement therapy   . Transexualism    Past Surgical History:  Procedure Laterality Date  . COSMETIC SURGERY     rhinoplasty  . RHINOPLASTY     History reviewed. No pertinent family history. Social History  Substance Use Topics  . Smoking status: Former Smoker    Packs/day: 1.00  . Smokeless tobacco: Never Used  . Alcohol use Yes     Comment: Rarely   OB History    No data available     Review of Systems  Constitutional: Positive for diaphoresis and fatigue. Negative for chills and fever.  HENT: Negative for congestion, ear pain, sore throat, trouble swallowing and voice change.   Respiratory: Negative for cough and shortness of breath.   Cardiovascular: Negative for chest pain and palpitations.  Gastrointestinal: Negative for abdominal pain, diarrhea, nausea and vomiting.  Musculoskeletal: Negative for arthralgias, back pain and myalgias.  Skin: Negative for rash.  Neurological: Positive for dizziness, weakness ( generalized) and light-headedness.  Negative for seizures and syncope.    Allergies  Amoxicillin  Home Medications   Prior to Admission medications   Medication Sig Start Date End Date Taking? Authorizing Provider  benzonatate (TESSALON) 100 MG capsule Take 1 capsule (100 mg total) by mouth every 8 (eight) hours. 01/13/16   Maia PlanJoshua G Long, MD  cephALEXin (KEFLEX) 500 MG capsule Take 1 capsule (500 mg total) by mouth 2 (two) times daily. Patient not taking: Reported on 04/14/2015 05/02/14   Frederick N Ward, DO  clindamycin (CLEOCIN) 150 MG capsule Take 1 capsule (150 mg total) by mouth every 6 (six) hours. Patient not taking: Reported on 04/14/2015 10/04/14   Frederick LowerVrinda Pickering, NP  clindamycin (CLEOCIN) 150 MG capsule Take 1 capsule (150 mg total) by mouth 3 (three) times daily. 08/07/15   Frederick AquasFrank C Patrick, PA  ibuprofen (ADVIL,MOTRIN) 600 MG tablet Take 1 tablet (600 mg total) by mouth every 6 (six) hours as needed. 04/14/15   Frederick Batonourtney F Horton, MD  loperamide (IMODIUM) 2 MG capsule Take 1 capsule (2 mg total) by mouth 4 (four) times daily as needed for diarrhea or loose stools. 04/14/15   Frederick Batonourtney F Horton, MD  ondansetron (ZOFRAN ODT) 4 MG disintegrating tablet Take 1 tablet (4 mg total) by mouth every 8 (eight) hours as needed for nausea or vomiting. 04/14/15   Frederick Batonourtney F Horton, MD  ondansetron (ZOFRAN) 4 MG tablet Take 1 tablet (4 mg total) by mouth every 6 (six) hours. 01/14/16   Frederick Crumbleatyana Kirichenko, PA-C   Meds Ordered and  Administered this Visit  Medications - No data to display  BP 129/83 (BP Location: Right Arm)   Pulse 70   Temp 98.7 F (37.1 C) (Oral)   Resp 18   SpO2 100%  No data found.   Physical Exam  Constitutional: He appears well-developed and well-nourished. No distress.  Pt sitting on exam table, NAD. Non-toxic appearing.  HENT:  Head: Normocephalic and atraumatic.  Eyes: Conjunctivae are normal. No scleral icterus.  Neck: Normal range of motion.  Cardiovascular: Normal rate, regular rhythm and normal  heart sounds.   Pulmonary/Chest: Effort normal and breath sounds normal. No respiratory distress. He has no wheezes. He has no rales.  Abdominal: Soft. He exhibits no distension. There is no tenderness.  Musculoskeletal: Normal range of motion.  Neurological: He is alert.  Skin: Skin is warm and dry. He is not diaphoretic.  Nursing note and vitals reviewed.   Urgent Care Course   Clinical Course     Procedures (including critical care time)  Labs Review Labs Reviewed - No data to display  Imaging Review No results found.    MDM   1. Generalized weakness   2. Hot flashes    Pt c/o intermittent hot flashes with diaphoresis and generalized weakness and fatigue. [facility] for same symptoms reviewed. Workup and labs there normal. Advised pt no additional labs available at this facility.  Encouraged to f/u with endocrinologist or f/u with previously established appointment with hormone specialist on 11/28 for further evaluation and treatment of symptoms as the current hormone replacement therapy pt is on is like the cause of current symptoms.     [name], PA-C 01/20/16 1815

## 2016-01-20 NOTE — ED Triage Notes (Signed)
[facility] a complaint of weakness, lightheadedness and dizziness for 1 week. The patient stated that she has been taking hormone injections and oral medications and over the last week she has had headaches along with the dizziness and lightheadedness. The patient was evaluated [facility] 01/14/2016 at [facility] for the same complaint.

## 2016-01-21 ENCOUNTER — Encounter (HOSPITAL_COMMUNITY): Payer: Self-pay | Admitting: Emergency Medicine

## 2016-01-21 ENCOUNTER — Emergency Department (HOSPITAL_COMMUNITY): Payer: Self-pay

## 2016-01-21 ENCOUNTER — Emergency Department (HOSPITAL_COMMUNITY)
Admission: EM | Admit: 2016-01-21 | Discharge: 2016-01-21 | Disposition: A | Discharge status: Home / Self Care | Payer: Self-pay | Attending: Emergency Medicine | Admitting: Emergency Medicine

## 2016-01-21 DIAGNOSIS — Z79899 Other long term (current) drug therapy: Secondary | ICD-10-CM | POA: Insufficient documentation

## 2016-01-21 DIAGNOSIS — R109 Unspecified abdominal pain: Secondary | ICD-10-CM | POA: Insufficient documentation

## 2016-01-21 DIAGNOSIS — Z87891 Personal history of nicotine dependence: Secondary | ICD-10-CM | POA: Insufficient documentation

## 2016-01-21 LAB — URINALYSIS, ROUTINE W REFLEX MICROSCOPIC
BILIRUBIN URINE: NEGATIVE
GLUCOSE, UA: NEGATIVE mg/dL
KETONES UR: NEGATIVE mg/dL
Nitrite: NEGATIVE
PROTEIN: NEGATIVE mg/dL
Specific Gravity, Urine: 1.015 (ref 1.005–1.030)
pH: 6 (ref 5.0–8.0)

## 2016-01-21 LAB — URINE MICROSCOPIC-ADD ON

## 2016-01-21 MED ORDER — IBUPROFEN 400 MG PO TABS
400.0000 mg | ORAL_TABLET | Freq: Three times a day (TID) | ORAL | 0 refills | Status: DC
Start: 2016-01-21 — End: 2017-07-16

## 2016-01-21 NOTE — ED Triage Notes (Signed)
Pt reports left flank pain , hematuria and dysuria. Denies abd pain. Reports nausea, denies emesis and diarrhea.  Pt requests to be called "Frederick Hess"

## 2016-01-21 NOTE — ED Notes (Signed)
Patient is A & O x4.  Denies any pain. 

## 2016-01-21 NOTE — Discharge Instructions (Signed)
As discussed, your evaluation today has been largely reassuring.  But, it is important that you monitor your condition carefully, and do not hesitate to return to the ED if you develop new, or concerning changes in your condition.  Your symptoms are likely caused (at least in part) by irritation of the ureter.  Please discuss today's evaluation with your physician and our urologist.

## 2016-01-21 NOTE — ED Provider Notes (Signed)
[facility]-EMERGENCY DEPT Provider Note   CSN: [id number] Arrival date & time: 01/21/16  1016     History   Chief Complaint Chief Complaint  Patient presents with  . Flank Pain    left   . Hematuria    HPI Frederick Hess is a 30 y.o. male.  HPI  Patient presents with concern of left-sided flank pain, hematuria, dysuria. Patient has notable history of ongoing hormone replaced therapy transitioning from male to male. No history of genital surgery. He notes over the past week he has had mild other illnesses, has been seen at urgent care, emergency department, but has had no similar dysuria, hematuria until the past few hours. He has pain focally in the left flank, sore, moderate, nonradiating. No fever, no chills. No clear precipitant. He specifically denies penis or scrotal changes or pain. No medication taken for pain relief this morning.    Past Medical History:  Diagnosis Date  . Hormone replacement therapy   . Transexualism     Patient Active Problem List   Diagnosis Date Noted  . Depression (emotion) 10/30/2013  . 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 Substance abuse 10/10/2013  . Borderline personality disorder 10/10/2013    Past Surgical History:  Procedure Laterality Date  . COSMETIC SURGERY     rhinoplasty  . RHINOPLASTY      OB History    No data available       Home Medications    Prior to Admission medications   Medication Sig Start Date End Date Taking? Authorizing Provider  benzonatate (TESSALON) 100 MG capsule Take 1 capsule (100 mg total) by mouth every 8 (eight) hours. 01/13/16   Maia PlanJoshua G Long, MD  cephALEXin (KEFLEX) 500 MG capsule Take 1 capsule (500 mg total) by mouth 2 (two) times daily. Patient not taking: Reported on 04/14/2015 05/02/14   Kristen N Ward, DO  clindamycin (CLEOCIN) 150 MG capsule Take 1 capsule (150 mg total) by mouth every 6 (six) hours. Patient not taking: Reported on  04/14/2015 10/04/14   Teressa LowerVrinda Pickering, NP  clindamycin (CLEOCIN) 150 MG capsule Take 1 capsule (150 mg total) by mouth 3 (three) times daily. 08/07/15   Tharon AquasFrank C Patrick, PA  ibuprofen (ADVIL,MOTRIN) 600 MG tablet Take 1 tablet (600 mg total) by mouth every 6 (six) hours as needed. 04/14/15   Shon Batonourtney F Horton, MD  loperamide (IMODIUM) 2 MG capsule Take 1 capsule (2 mg total) by mouth 4 (four) times daily as needed for diarrhea or loose stools. 04/14/15   Shon Batonourtney F Horton, MD  ondansetron (ZOFRAN ODT) 4 MG disintegrating tablet Take 1 tablet (4 mg total) by mouth every 8 (eight) hours as needed for nausea or vomiting. 04/14/15   Shon Batonourtney F Horton, MD  ondansetron (ZOFRAN) 4 MG tablet Take 1 tablet (4 mg total) by mouth every 6 (six) hours. 01/14/16   Jaynie Crumbleatyana Kirichenko, PA-C    Family History No family history on file.  Social History Social History  Substance Use Topics  . Smoking status: Former Smoker    Packs/day: 1.00  . Smokeless tobacco: Never Used  . Alcohol use Yes     Comment: Rarely     Allergies   Amoxicillin   Review of Systems Review of Systems  Constitutional:       Per HPI, otherwise negative  HENT:       Per HPI, otherwise negative  Respiratory:       Per HPI, otherwise negative  Cardiovascular:       Per HPI, otherwise negative  Gastrointestinal: Positive for nausea. Negative for vomiting.  Endocrine:       Per history of present illness  Genitourinary:       Neg aside from HPI   Musculoskeletal:       Per HPI, otherwise negative  Skin: Negative.   Neurological: Negative for syncope.     Physical Exam Updated Vital Signs BP 134/88   Pulse 111   Temp 98.5 F (36.9 C) (Oral)   Resp 18   SpO2 98%   Physical Exam  Constitutional: He is oriented to person, place, and time. He appears well-developed. No distress.  HENT:  Head: Normocephalic and atraumatic.  Eyes: Conjunctivae and EOM are normal.  Cardiovascular: Normal rate and regular rhythm.     Pulmonary/Chest: Effort normal. No stridor. No respiratory distress.  Abdominal: He exhibits no distension.  Minimal discomfort with palpation but the left flank otherwise abdominal exam unremarkable  Musculoskeletal: He exhibits no edema.  Neurological: He is alert and oriented to person, place, and time.  Skin: Skin is warm and dry.  Psychiatric: He has a normal mood and affect.  Nursing note and vitals reviewed.    ED Treatments / Results  Labs (all labs ordered are listed, but only abnormal results are displayed) Labs Reviewed  URINALYSIS, ROUTINE W REFLEX MICROSCOPIC (NOT AT [facility]) - Abnormal; Notable for the following:       Result Value   APPearance CLOUDY (*)    Hgb urine dipstick TRACE (*)    Leukocytes, UA TRACE (*)    All other components within normal limits  URINE MICROSCOPIC-ADD ON - Abnormal; Notable for the following:    Squamous Epithelial / LPF 0-5 (*)    Bacteria, UA RARE (*)    All other components within normal limits    Chart review notable for 2 visits within the past week for a variety of complaints, is also notable for microscopic hematuria prior urinalysis.   Radiology Ct Renal Stone Study  Result Date: 01/21/2016 CLINICAL DATA:  Left flank pain, hematuria, and dysuria.  Nausea. EXAM: CT ABDOMEN AND PELVIS WITHOUT CONTRAST TECHNIQUE: Multidetector CT imaging of the abdomen and pelvis was performed following the standard protocol without IV contrast. COMPARISON:  None. FINDINGS: Lower chest: Minimal atelectasis in the lung bases. No pleural effusion. Normal heart size. Hepatobiliary: 3 mm stone in the gallbladder. The gallbladder is largely decompressed without evidence of gross wall thickening or pericholecystic inflammation. No focal liver abnormality is identified. There is no biliary dilatation. Pancreas: Unremarkable. Spleen: Unremarkable. Adrenals/Urinary Tract: Unremarkable adrenal glands. No renal calculi, hydronephrosis, renal mass, ureteral  calculi, or ureteral dilatation is seen. The bladder is unremarkable. Stomach/Bowel: The stomach is within normal limits. There is no evidence of bowel obstruction or bowel wall thickening. The appendix is unremarkable. Vascular/Lymphatic: Normal caliber of the abdominal aorta. No enlarged lymph nodes are identified. Reproductive: Unremarkable prostate. The testicles are visualized in the inguinal canals bilaterally. Other: Prominent, symmetric diffuse subcutaneous stranding and nodularity in both gluteal regions. Musculoskeletal: No acute osseous abnormality or suspicious osseous lesion seen. IMPRESSION: 1. No evidence of urinary tract calculi or obstruction. 2. Cholelithiasis. 3. Both testicles located in the inguinal canals. 4. Symmetric subcutaneous fat reticulation and nodularity in the gluteal regions bilaterally, nonspecific. Electronically Signed   By: Sebastian AcheAllen  Grady M.D.   On: 01/21/2016 14:03    Procedures Procedures (including critical care time)    Initial Impression / Assessment and  Plan / ED Course  I have reviewed the triage vital signs and the nursing notes.  Pertinent labs & imaging results that were available during my care of the patient were reviewed by me and considered in my medical decision making (see chart for details).  Clinical Course     2:48 PM Patient awake, alert, in no distress. I discussed all findings, including consideration of renal colic as contributory etiology. With no evidence for infection, no urinary obstruction, no other complaints, no evidence for bacteremia, sepsis, patient is appropriate for outpatient management.   Final Clinical Impressions(s) / ED Diagnoses  Flank pain   Gerhard Munch, MD 01/21/16 1448

## 2016-04-07 ENCOUNTER — Encounter (HOSPITAL_COMMUNITY): Payer: Self-pay | Admitting: Emergency Medicine

## 2016-04-07 ENCOUNTER — Emergency Department (HOSPITAL_COMMUNITY)
Admission: EM | Admit: 2016-04-07 | Discharge: 2016-04-08 | Disposition: A | Discharge status: Home / Self Care | Payer: Self-pay | Attending: Emergency Medicine | Admitting: Emergency Medicine

## 2016-04-07 DIAGNOSIS — Y939 Activity, unspecified: Secondary | ICD-10-CM | POA: Insufficient documentation

## 2016-04-07 DIAGNOSIS — S61309A Unspecified open wound of unspecified finger with damage to nail, initial encounter: Secondary | ICD-10-CM

## 2016-04-07 DIAGNOSIS — Z87891 Personal history of nicotine dependence: Secondary | ICD-10-CM | POA: Insufficient documentation

## 2016-04-07 DIAGNOSIS — Y999 Unspecified external cause status: Secondary | ICD-10-CM | POA: Insufficient documentation

## 2016-04-07 DIAGNOSIS — S61303A Unspecified open wound of left middle finger with damage to nail, initial encounter: Secondary | ICD-10-CM | POA: Insufficient documentation

## 2016-04-07 DIAGNOSIS — W230XXA Caught, crushed, jammed, or pinched between moving objects, initial encounter: Secondary | ICD-10-CM | POA: Insufficient documentation

## 2016-04-07 DIAGNOSIS — Y929 Unspecified place or not applicable: Secondary | ICD-10-CM | POA: Insufficient documentation

## 2016-04-07 MED ORDER — LIDOCAINE HCL (PF) 1 % IJ SOLN
5.0000 mL | Freq: Once | INTRAMUSCULAR | Status: AC
  Administered 2016-04-07: 5 mL via INTRADERMAL
  Filled 2016-04-07: qty 5

## 2016-04-07 NOTE — ED Provider Notes (Signed)
[facility]-EMERGENCY DEPT Provider Note   CSN: [id number] Arrival date & time: 04/07/16  2050     History   Chief Complaint Chief Complaint  Patient presents with  . Finger Injury    HPI Frederick Hess is a 31 y.o. male with a hx of Male to male transition with hormone replacement presents to the Emergency Department complaining of acute, persistent pain in the left long finger after bending her fingernail back approximately several hours prior to arrival. Patient reports she has acrylic nails and got it caught on something bending in all the way backwards. She reports there was associated bleeding underneath the nail afterwards. She denies fevers or chills, nausea or vomiting. No treatments prior to arrival.  Palpation of the finger makes the symptoms worse. Nothing makes it better.   The history is provided by the patient and medical records. No language interpreter was used.    Past Medical History:  Diagnosis Date  . Hormone replacement therapy   . Transexualism     Patient Active Problem List   Diagnosis Date Noted  . Depression (emotion) 10/30/2013  . 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 Substance abuse 10/10/2013  . Borderline personality disorder 10/10/2013    Past Surgical History:  Procedure Laterality Date  . COSMETIC SURGERY     rhinoplasty  . RHINOPLASTY      OB History    No data available       Home Medications    Prior to Admission medications   Medication Sig Start Date End Date Taking? Authorizing Provider  ESTRADIOL PO Take 1 tablet by mouth daily.    Historical Provider, MD  estrogens, conjugated, (PREMARIN) 1.25 MG tablet Take 1.25 mg by mouth daily.    Historical Provider, MD  ibuprofen (ADVIL,MOTRIN) 400 MG tablet Take 1 tablet (400 mg total) by mouth 3 (three) times daily. Take one tablet three times daily for three days 01/21/16   Gerhard Munchobert Lockwood, MD    Family History No family history on  file.  Social History Social History  Substance Use Topics  . Smoking status: Former Smoker    Packs/day: 1.00  . Smokeless tobacco: Never Used  . Alcohol use Yes     Comment: Rarely     Allergies   Amoxicillin   Review of Systems Review of Systems  Skin: Positive for wound ( left long finger).  All other systems reviewed and are negative.    Physical Exam Updated Vital Signs BP 143/87 (BP Location: Right Arm)   Pulse 77   Temp 98.4 F (36.9 C) (Oral)   Resp 18   SpO2 100%   Physical Exam  Constitutional: He appears well-developed and well-nourished. No distress.  HENT:  Head: Normocephalic and atraumatic.  Eyes: Conjunctivae are normal.  Neck: Normal range of motion.  Cardiovascular: Normal rate, regular rhythm and intact distal pulses.   Capillary refill < 3 sec  Pulmonary/Chest: Effort normal and breath sounds normal.  Musculoskeletal: He exhibits tenderness. He exhibits no edema.  Left long finger: Full range of motion of the DIP, PIP, and MCP  Neurological: He is alert. Coordination normal.  Sensation intact to dull and sharp in the left long finger Strength 5/5 with flexion and extension of the left long finger  Skin: Skin is warm and dry. He is not diaphoretic.  No tenting of the skin Nailbed without laceration, bleeding controlled with pressure  Psychiatric: He has a normal mood and affect.  Nursing  note and vitals reviewed.    ED Treatments / Results   Procedures .Nail Removal Date/Time: 04/07/2016 11:55 PM Performed by: Dierdre Forth Authorized by: Dierdre Forth   Consent:    Consent obtained:  Verbal   Consent given by:  Patient   Risks discussed:  Bleeding, infection, pain and incomplete removal   Alternatives discussed:  Observation Location:    Hand:  L long finger Pre-procedure details:    Skin preparation:  Betadine   Preparation: Patient was prepped and draped in the usual sterile fashion   Anesthesia (see MAR for  exact dosages):    Anesthesia method:  Nerve block   Block needle gauge:  27 G   Block anesthetic:  Lidocaine 1% w/o epi (5ml)   Block injection procedure:  Anatomic landmarks identified, introduced needle, incremental injection, anatomic landmarks palpated and negative aspiration for blood   Block outcome:  Anesthesia achieved Nail Removal:    Nail removed:  Complete   Nail bed repaired: no     Removed nail replaced and anchored: yes     Stented with:  Petroleum gauze Post-procedure details:    Dressing:  Petrolatum-impregnated gauze   Patient tolerance of procedure:  Tolerated well, no immediate complications    (including critical care time)  Medications Ordered in ED Medications  lidocaine (PF) (XYLOCAINE) 1 % injection 5 mL (5 mLs Intradermal Given 04/07/16 2340)     Initial Impression / Assessment and Plan / ED Course  I have reviewed the triage vital signs and the nursing notes.  Pertinent labs & imaging results that were available during my care of the patient were reviewed by me and considered in my medical decision making (see chart for details).     Pt with Incomplete avulsion of the nailbed of the left long finger. Nail removed under anesthesia. Nailbed cleaned with copious amounts of water. Nailfold splinted it with a petroleum gauze. No signs of infection. Discussed reasons to return to emergency department including signs of infection. Patient states understanding and is in agreement with the plan.  Final Clinical Impressions(s) / ED Diagnoses   Final diagnoses:  Nail avulsion, finger, initial encounter    New Prescriptions New Prescriptions   No medications on file     Dierdre Forth, PA-C 04/08/16 0026    Layla Maw Ward, DO 04/08/16 [id number]

## 2016-04-07 NOTE — ED Notes (Signed)
Pt did not respond for room assignment 

## 2016-04-07 NOTE — ED Triage Notes (Signed)
Pt st's his left ring fingernail got caught on something and bent it backwards.  Bleeding noted under nail

## 2016-04-07 NOTE — ED Notes (Signed)
ED Provider at bedside. 

## 2016-04-08 NOTE — Discharge Instructions (Signed)
1. Medications:  usual home medications 2. Treatment: rest, drink plenty of fluids, keep wound clean and dry 3. Follow Up: Please followup with your primary doctor in 3-5 days for discussion of your diagnoses and further evaluation after today's visit; if you do not have a primary care doctor use the resource guide provided to find one; Please return to the ER for signs of infection including redness, warmth or purulent drainage

## 2016-04-11 ENCOUNTER — Emergency Department (HOSPITAL_COMMUNITY)
Admission: EM | Admit: 2016-04-11 | Discharge: 2016-04-11 | Disposition: A | Discharge status: Home / Self Care | Payer: Self-pay | Attending: Emergency Medicine | Admitting: Emergency Medicine

## 2016-04-11 ENCOUNTER — Encounter (HOSPITAL_COMMUNITY): Payer: Self-pay | Admitting: Emergency Medicine

## 2016-04-11 DIAGNOSIS — Z79899 Other long term (current) drug therapy: Secondary | ICD-10-CM | POA: Insufficient documentation

## 2016-04-11 DIAGNOSIS — Z87891 Personal history of nicotine dependence: Secondary | ICD-10-CM | POA: Insufficient documentation

## 2016-04-11 DIAGNOSIS — Z5189 Encounter for other specified aftercare: Secondary | ICD-10-CM

## 2016-04-11 DIAGNOSIS — Z4801 Encounter for change or removal of surgical wound dressing: Secondary | ICD-10-CM | POA: Insufficient documentation

## 2016-04-11 MED ORDER — LIDOCAINE HCL (PF) 1 % IJ SOLN
5.0000 mL | Freq: Once | INTRAMUSCULAR | Status: AC
  Administered 2016-04-11: 5 mL
  Filled 2016-04-11: qty 5

## 2016-04-11 MED ORDER — DOXYCYCLINE HYCLATE 100 MG PO CAPS: 100.0000 mg | ORAL_CAPSULE | Freq: Two times a day (BID) | ORAL | 0 refills | Status: DC

## 2016-04-11 NOTE — ED Provider Notes (Signed)
[facility] Provider Note   CSN: [id number] Arrival date & time: 04/11/16  0019     History   Chief Complaint Chief Complaint  Patient presents with  . Finger Injury    HPI Frederick Hess is a 31 y.o. male.  Patient presents to the emergency department with chief complaint of wound check. Patient complains of having a recently lost a nail, and had a dressing applied in the emergency department. States that the dressing is now stuck to the fingertip. Patient is unable to remove it. Patient denies any associated fevers, or chills. There is reported pain with palpation. There are no other associated symptoms.   The history is provided by the patient. No language interpreter was used.    Past Medical History:  Diagnosis Date  . Hormone replacement therapy   . Transexualism     Patient Active Problem List   Diagnosis Date Noted  . Depression (emotion) 10/30/2013  . 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 Substance abuse 10/10/2013  . Borderline personality disorder 10/10/2013    Past Surgical History:  Procedure Laterality Date  . COSMETIC SURGERY     rhinoplasty  . RHINOPLASTY      OB History    No data available       Home Medications    Prior to Admission medications   Medication Sig Start Date End Date Taking? Authorizing Provider  ESTRADIOL PO Take 1 tablet by mouth daily.    Historical Provider, MD  estrogens, conjugated, (PREMARIN) 1.25 MG tablet Take 1.25 mg by mouth daily.    Historical Provider, MD  ibuprofen (ADVIL,MOTRIN) 400 MG tablet Take 1 tablet (400 mg total) by mouth 3 (three) times daily. Take one tablet three times daily for three days 01/21/16   Gerhard Munch, MD    Family History History reviewed. No pertinent family history.  Social History Social History  Substance Use Topics  . Smoking status: Former Smoker    Packs/day: 1.00  . Smokeless tobacco: Never Used  . Alcohol use  Yes     Comment: Rarely     Allergies   Amoxicillin   Review of Systems Review of Systems  Skin: Positive for wound.  All other systems reviewed and are negative.    Physical Exam Updated Vital Signs BP 125/87   Pulse 61   Temp 98.1 F (36.7 C) (Oral)   Resp 18   Ht 6' (1.829 m)   Wt 92.3 kg   SpO2 99%   BMI 27.59 kg/m   Physical Exam  Constitutional: He is oriented to person, place, and time. He appears well-developed and well-nourished.  HENT:  Head: Normocephalic and atraumatic.  Eyes: Conjunctivae and EOM are normal.  Neck: Normal range of motion.  Cardiovascular: Normal rate.   Pulmonary/Chest: Effort normal.  Abdominal: He exhibits no distension.  Musculoskeletal: Normal range of motion.  Neurological: He is alert and oriented to person, place, and time.  Skin: Skin is dry.  Left ring fingernail avulsion with Xeroform dressing applied and stuck to skin  Psychiatric: He has a normal mood and affect. His behavior is normal. Judgment and thought content normal.  Nursing note and vitals reviewed.    ED Treatments / Results  Labs (all labs ordered are listed, but only abnormal results are displayed) Labs Reviewed - No data to display  EKG  EKG Interpretation None       Radiology No results found.  Procedures .Foreign Body Removal Date/Time:  04/11/2016 3:53 AM Performed by: Roxy HorsemanBROWNING, Adreona Brand Authorized by: Roxy HorsemanBROWNING, Orie Baxend[place]  Consent: Verbal consent obtained. Risks and benefits: risks, benefits and alternatives were discussed Consent given by: patient Patient understanding: patient states understanding of the procedure being performed Patient consent: the patient's understanding of the procedure matches consent given Procedure consent: procedure consent matches procedure scheduled Relevant documents: relevant documents present and verified Test results: test results available and properly labeled Site marked: the operative site was  marked Imaging studies: imaging studies available Required items: required blood products, implants, devices, and special equipment available Patient identity confirmed: verbally with patient Time out: Immediately prior to procedure a "time out" was called to verify the correct patient, procedure, equipment, support staff and site/side marked as required. Body area: skin General location: upper extremity Location details: left ring finger Anesthesia: digital block  Anesthesia: Local Anesthetic: lidocaine 1% without epinephrine Anesthetic total: 5 mL  Sedation: Patient sedated: no Patient restrained: no Patient cooperative: yes Localization method: visualized Removal mechanism: forceps Dressing: antibiotic ointment and dressing applied Tendon involvement: none Depth: subcutaneous Complexity: simple 1 objects recovered. Objects recovered: bit of xeroform Post-procedure assessment: foreign body removed Patient tolerance: Patient tolerated the procedure well with no immediate complications   (including critical care time)  Medications Ordered in ED Medications  lidocaine (PF) (XYLOCAINE) 1 % injection 5 mL (5 mLs Infiltration Given 04/11/16 0302)     Initial Impression / Assessment and Plan / ED Course  I have reviewed the triage vital signs and the nursing notes.  Pertinent labs & imaging results that were available during my care of the patient were reviewed by me and considered in my medical decision making (see chart for details).     Patient here for wound check.  No clear sign of infection, but there is some mild swelling.  Will give doxy.  FB removed in Ed.  Bacitracin and non-stick dressing applied.  Final Clinical Impressions(s) / ED Diagnoses   Final diagnoses:  Visit for wound check    New Prescriptions New Prescriptions   DOXYCYCLINE (VIBRAMYCIN) 100 MG CAPSULE    Take 1 capsule (100 mg total) by mouth 2 (two) times daily.     Roxy Horsemanobert Lonetta Blassingame,  PA-C 04/11/16 [phone]    Pricilla LovelessScott Goldston, MD 04/17/16 [phone]

## 2016-04-11 NOTE — ED Triage Notes (Signed)
Arrives with complaint of left ring finger pain. States 2 days ago the nail was sheared off. Was seen here and finger was dressed. Patient concerned about finger and unable to change dressing. Finger appears similar to others. No spreading redness or gross edema.

## 2016-04-11 NOTE — ED Notes (Signed)
The pt is c/o lt  Ring finger nail  Was caught in a door 2-3 days ago  He came here to be seen  Now he thinks his finger is infected

## 2016-04-11 NOTE — ED Notes (Signed)
The pt given a cup to soak his ring finger with saline and betadine solution

## 2017-05-22 ENCOUNTER — Encounter (HOSPITAL_COMMUNITY): Payer: Self-pay | Admitting: *Deleted

## 2017-05-22 ENCOUNTER — Emergency Department (HOSPITAL_COMMUNITY)
Admission: EM | Admit: 2017-05-22 | Discharge: 2017-05-22 | Disposition: A | Discharge status: Home / Self Care | Payer: Self-pay | Attending: Emergency Medicine | Admitting: Emergency Medicine

## 2017-05-22 ENCOUNTER — Emergency Department (HOSPITAL_COMMUNITY): Payer: Self-pay

## 2017-05-22 DIAGNOSIS — R42 Dizziness and giddiness: Secondary | ICD-10-CM | POA: Insufficient documentation

## 2017-05-22 DIAGNOSIS — F640 Transsexualism: Secondary | ICD-10-CM | POA: Insufficient documentation

## 2017-05-22 DIAGNOSIS — R319 Hematuria, unspecified: Secondary | ICD-10-CM | POA: Insufficient documentation

## 2017-05-22 DIAGNOSIS — Z87891 Personal history of nicotine dependence: Secondary | ICD-10-CM | POA: Insufficient documentation

## 2017-05-22 LAB — CBC
HEMATOCRIT: 44.1 % (ref 39.0–52.0)
HEMOGLOBIN: 15.4 g/dL (ref 13.0–17.0)
MCH: 29.8 pg (ref 26.0–34.0)
MCHC: 34.9 g/dL (ref 30.0–36.0)
MCV: 85.3 fL (ref 78.0–100.0)
Platelets: 158 10*3/uL (ref 150–400)
RBC: 5.17 MIL/uL (ref 4.22–5.81)
RDW: 13.4 % (ref 11.5–15.5)
WBC: 7.2 10*3/uL (ref 4.0–10.5)

## 2017-05-22 LAB — COMPREHENSIVE METABOLIC PANEL
ALBUMIN: 4.1 g/dL (ref 3.5–5.0)
ALT: 13 U/L — ABNORMAL LOW (ref 17–63)
ANION GAP: 10 (ref 5–15)
AST: 18 U/L (ref 15–41)
Alkaline Phosphatase: 35 U/L — ABNORMAL LOW (ref 38–126)
BILIRUBIN TOTAL: 0.8 mg/dL (ref 0.3–1.2)
BUN: 6 mg/dL (ref 6–20)
CHLORIDE: 102 mmol/L (ref 101–111)
CO2: 25 mmol/L (ref 22–32)
Calcium: 9.3 mg/dL (ref 8.9–10.3)
Creatinine, Ser: 1.24 mg/dL (ref 0.61–1.24)
GFR calc Af Amer: >60 mL/min (ref >60)
GFR calc non Af Amer: >60 mL/min (ref >60)
GLUCOSE: 105 mg/dL — AB (ref 65–99)
POTASSIUM: 4.1 mmol/L (ref 3.5–5.1)
SODIUM: 137 mmol/L (ref 135–145)
TOTAL PROTEIN: 7.8 g/dL (ref 6.5–8.1)

## 2017-05-22 LAB — URINALYSIS, ROUTINE W REFLEX MICROSCOPIC
BACTERIA UA: NONE SEEN
BILIRUBIN URINE: NEGATIVE
Glucose, UA: NEGATIVE mg/dL
Ketones, ur: NEGATIVE mg/dL
NITRITE: NEGATIVE
PROTEIN: NEGATIVE mg/dL
Specific Gravity, Urine: 1.014 (ref 1.005–1.030)
pH: 5 (ref 5.0–8.0)

## 2017-05-22 LAB — D-DIMER, QUANTITATIVE (NOT AT ARMC): D DIMER QUANT: 0.36 ug{FEU}/mL (ref 0.00–0.50)

## 2017-05-22 LAB — RAPID HIV SCREEN (HIV 1/2 AB+AG)
HIV 1/2 Antibodies: NONREACTIVE
HIV-1 P24 ANTIGEN - HIV24: NONREACTIVE

## 2017-05-22 LAB — I-STAT TROPONIN, ED: TROPONIN I, POC: 0 ng/mL (ref 0.00–0.08)

## 2017-05-22 MED ORDER — LIDOCAINE HCL (PF) 1 % IJ SOLN
INTRAMUSCULAR | Status: AC
  Administered 2017-05-22: 5 mL
  Filled 2017-05-22: qty 5

## 2017-05-22 MED ORDER — AZITHROMYCIN 250 MG PO TABS
1000.0000 mg | ORAL_TABLET | Freq: Once | ORAL | Status: AC
  Administered 2017-05-22: 1000 mg via ORAL
  Filled 2017-05-22: qty 4

## 2017-05-22 MED ORDER — CEFTRIAXONE SODIUM 250 MG IJ SOLR
250.0000 mg | Freq: Once | INTRAMUSCULAR | Status: AC
  Administered 2017-05-22: 250 mg via INTRAMUSCULAR
  Filled 2017-05-22: qty 250

## 2017-05-22 NOTE — ED Notes (Signed)
Pt reports some nausea and dizziness that has been on going for about 2 weeks. Came in today for same after episode at work.

## 2017-05-22 NOTE — Discharge Instructions (Signed)
Work up today is reassuring. The cause of your symptoms is unknown.  Some explanations could be dehydration, emotional stress, anxiety, or medications. We have sent your test for urinary tract infection and STDs, results are pending and you will be notified in 2 days if positive. You were treated for gonorrhea and chlamydia today. Your rapid HIV test was negative.   Stay well hydrated. Eat at least every 2-4 hours.   Further evaluation of blood in urine needs to be done by urology. Further evaluation for continued light-headedness needs to be done by cardiology. If unable to afford specialists evaluation, follow up with [facility] to establish care with a primary care provider for regular, routine medical care.  This clinic accepts patients without medical insurance. A primary care provider can adjust your daily medications and give you refills.

## 2017-05-22 NOTE — ED Triage Notes (Addendum)
To ED for eval after becoming very nauseated while at work today. No syncope. No vomiting. No diarrhea. Pt states she has been eating and drinking ok. Appears in nad. Pt states she is on hormones that she doesn't get from a doctor.

## 2017-05-22 NOTE — ED Notes (Signed)
Patient transported to X-ray 

## 2017-05-22 NOTE — ED Provider Notes (Signed)
[facility] EMERGENCY DEPARTMENT Provider Note   CSN: [id number] Arrival date & time: 05/22/17  1132     History   Chief Complaint Chief Complaint  Patient presents with  . Nausea    HPI Frederick Hess is a 32 y.o. adult MTF is here for evaluation of light-headedness described as "feeling like I was going to pass out" onset this morning while getting ready for work. Describes it as "feeling faint" associated with mild nausea, breaking out in a sweat and heart skipping fast. This momentarily resolved on its own, pt felt better and went to work however while at work felt faint again and came to ED for evaluation. States she had similar symptoms 2 weeks ago at work when she actually passed out, does not have insurance to she did not seek medical attention. Has not eaten or drank anything all day today. States she feels ok now and denies current dizziness, nausea, vomiting, CP, SOB, palpitations. Is hungry. She has h/o anxiety. Is taking Delestrogen IM and estradiol pills for over 10 years that she gets from a friend. Due to lack insurance she has not been able to see a doctor for MTF transition or medication management.   No fevers, chills, CP, SOB, pleuritic CP, cough, abdominal pain, n/v/d/c. States she intermittently notices blood in her urine, not new, sometimes a lot and sometimes a little. No associated dysuria, frequency, urgency, flank pain. She is sexually active with one male partner without condom use.   HPI  Past Medical History:  Diagnosis Date  . Hormone replacement therapy   . Transexualism     Patient Active Problem List   Diagnosis Date Noted  . Depression (emotion) 10/30/2013  . 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 Substance abuse (HCC) 10/10/2013  . Borderline personality disorder (HCC) 10/10/2013    Past Surgical History:  Procedure Laterality Date  . COSMETIC SURGERY     rhinoplasty  .  RHINOPLASTY      OB History    No data available       Home Medications    Prior to Admission medications   Medication Sig Start Date End Date Taking? Authorizing Provider  ESTRADIOL PO Take 1 tablet by mouth daily.   Yes [provider]  Estradiol Valerate (DELESTROGEN IM) Inject 1 Dose into the muscle every 14 (fourteen) days.   Yes [provider]  doxycycline (VIBRAMYCIN) 100 MG capsule Take 1 capsule (100 mg total) by mouth 2 (two) times daily. Patient not taking: Reported on 05/22/2017 04/11/16   Roxy Horseman, PA-C  ibuprofen (ADVIL,MOTRIN) 400 MG tablet Take 1 tablet (400 mg total) by mouth 3 (three) times daily. Take one tablet three times daily for three days Patient not taking: Reported on 05/22/2017 01/21/16   Gerhard Munch, MD    Family History No family history on file.  Social History Social History   Tobacco Use  . Smoking status: Former Smoker    Packs/day: 1.00  . Smokeless tobacco: Never Used  Substance Use Topics  . Alcohol use: Yes    Comment: Rarely  . Drug use: Yes    Types: Marijuana     Allergies   Amoxicillin   Review of Systems Review of Systems  Cardiovascular: Positive for palpitations (resolved).  Gastrointestinal: Positive for nausea (resolved).  Neurological: Positive for syncope (2 weeks ago) and light-headedness.  All other systems reviewed and are negative.    Physical Exam Updated Vital Signs  BP 119/86   Pulse 63   Temp 98.6 F (37 C) (Oral)   Resp 13   SpO2 100%   Physical Exam  Constitutional: She is oriented to person, place, and time. She appears well-developed and well-nourished. No distress.  Non toxic  HENT:  Head: Normocephalic and atraumatic.  Nose: Nose normal.  Mouth/Throat: No oropharyngeal exudate.  Moist mucous membranes   Eyes: Conjunctivae and EOM are normal. Pupils are equal, round, and reactive to light.  Neck: Normal range of motion.  Cardiovascular: Normal rate, regular  rhythm and intact distal pulses.  No murmur heard. 2+ DP and radial pulses bilaterally. No LE edema.   Pulmonary/Chest: Effort normal and breath sounds normal. No respiratory distress. She has no wheezes. She has no rales.  Abdominal: Soft. Bowel sounds are normal. There is no tenderness.  No G/R/R. No suprapubic or CVA tenderness.   Genitourinary: Discharge found.  Genitourinary Comments:  External genitalia normal without erythema, edema, tenderness or lesions.  Circumcised penis.  No groin lymphadenopathy.  + Scant white/clear meatus discharge.  Glans and shaft smooth without tenderness, lesions, masses or deformity.  Scrotum without lesions or edema.  Non tender testicles. Epididymis and spermatic cord without tenderness or masses, bilaterally. Cremasteric reflex intact. RN present during exam  Musculoskeletal: Normal range of motion. She exhibits no deformity.  Neurological: She is alert and oriented to person, place, and time.  Alert and oriented to self, place, time and event.  Speech is fluent without obvious dysarthria or dysphasia. Strength 5/5 with hand grip and ankle F/E.   Sensation to light touch intact in hands and feet. Normal gait. No pronator drift. No leg drop.  Normal finger-to-nose.  CN I and VIII not tested. CN II-XII grossly intact bilaterally.  Knee and brachioradialis DTR symmetric. No ankle clonus.   Skin: Skin is warm and dry. Capillary refill takes less than 2 seconds.  Psychiatric: She has a normal mood and affect. Her behavior is normal. Judgment and thought content normal.  Nursing note and vitals reviewed.    ED Treatments / Results  Labs (all labs ordered are listed, but only abnormal results are displayed) Labs Reviewed  COMPREHENSIVE METABOLIC PANEL - Abnormal; Notable for the following components:      Result Value   Glucose, Bld 105 (*)    ALT 13 (*)    Alkaline Phosphatase 35 (*)    All other components within normal limits  URINALYSIS,  ROUTINE W REFLEX MICROSCOPIC - Abnormal; Notable for the following components:   Hgb urine dipstick SMALL (*)    Leukocytes, UA SMALL (*)    Squamous Epithelial / LPF 0-5 (*)    All other components within normal limits  URINE CULTURE  CBC  D-DIMER, QUANTITATIVE (NOT AT [facility])  RAPID HIV SCREEN (HIV 1/2 AB+AG)  HIV ANTIBODY (ROUTINE TESTING)  I-STAT TROPONIN, ED  GC/CHLAMYDIA PROBE AMP ([place]) NOT AT [facility]    EKG  EKG Interpretation  Date/Time:  Saturday May 22 2017 14:53:47 EST Ventricular Rate:  57 PR Interval:    QRS Duration: 99 QT Interval:  415 QTC Calculation: 404 R Axis:   14 Text Interpretation:  Sinus rhythm Left atrial enlargement RSR' in V1 or V2, probably normal variant ST elev, probable normal early repol pattern No significant change since last tracing Confirmed by Doug Sou [phone]) on 05/22/2017 3:00:46 PM       Radiology Dg Chest 2 View  Result Date: 05/22/2017 CLINICAL DATA:  Pt reports becoming very  nauseated while at work today. No syncope. No vomiting. No diarrhea. Reports SOB. Reports similar episode w/ syncope 2 weeks ago at work. Former smoker EXAM: CHEST - 2 VIEW COMPARISON:  01/13/2016 FINDINGS: The heart size and mediastinal contours are within normal limits. Both lungs are clear. No pleural effusion or pneumothorax. The visualized skeletal structures are unremarkable. IMPRESSION: Normal chest radiographs. Electronically Signed   By: Amie Portlandavid  Ormond M.D.   On: 05/22/2017 14:22    Procedures Procedures (including critical care time)  Medications Ordered in ED Medications  azithromycin (ZITHROMAX) tablet 1,000 mg (not administered)  cefTRIAXone (ROCEPHIN) injection 250 mg (not administered)     Initial Impression / Assessment and Plan / ED Course  I have reviewed the triage vital signs and the nursing notes.  Pertinent labs & imaging results that were available during my care of the patient were reviewed by me and considered in my  medical decision making (see chart for details).    Differential diagnosis includes vasovagal, orthostatics, hypo-glycemia. Less likely anemia, cardiac arrhythmias,AAA, dissection, pulmonary embolism. Patient has no history of seizures.  The patient has not had anything to eat or drink today, wondering if she is dehydrated. She does have some prodromal vasovagal type symptoms. Given ongoing long-term use of estrogen, PE is higher on differential.  Lab work today is reassuring, there is no anemia or leukocytosis. Electrolytes within normal limits. D-dimer is negative. EKG, chest x-ray and troponin negative. She has no exertional or pleuritic chest pain, delta troponin not felt to be indicated at this time. No family history of WPW or early onset of heart disease.  She has no neurological deficits, head trauma, history of SAH neuro etiology thought to be less likely and CT head not indicated today. Small hemoglobin noted in urinalysis, patient states that this is not new and has been ongoing for a long time. She has no associated urinary tract infection symptoms however she is at high risk for STDs. GC/Chlamydia test done, pending. She was empirically treated for STDs. Rapid HIV also sent.  Final Clinical Impressions(s) / ED Diagnoses   Patient was reevaluated, completely asymptomatic. She is ambulatory, eating and drinking without problems. Given age, low risk, benign workup today patient deemed appropriate for discharge.  Pt is at low risk per [place] syncope rule. Syncope possibly vasovagal or medication relation/emotional stress. She does endorse anxiety. Will d/c at this time with f/u with [facility] and cardiology. Discussed return precautions. Pt verbalized understanding and agreeable.  Final diagnoses:  Light headedness    ED Discharge Orders    None       Jerrell MylarGibbons, Jayzon Taras J, PA-C 05/22/17 1514    Doug SouJacubowitz, Sam, MD 05/22/17 [phone]

## 2017-05-23 LAB — HIV ANTIBODY (ROUTINE TESTING W REFLEX): HIV SCREEN 4TH GENERATION: NONREACTIVE

## 2017-05-24 LAB — URINE CULTURE: Culture: 50000 — AB

## 2017-05-24 LAB — GC/CHLAMYDIA PROBE AMP (~~LOC~~) NOT AT ARMC
CHLAMYDIA, DNA PROBE: NEGATIVE
Neisseria Gonorrhea: NEGATIVE

## 2017-05-25 ENCOUNTER — Telehealth: Payer: Self-pay | Admitting: Emergency Medicine

## 2017-05-25 NOTE — Telephone Encounter (Signed)
Post ED Visit - Positive Culture Follow-up  Culture report reviewed by antimicrobial stewardship pharmacist:  []  Enzo BiNathan Batchelder, Pharm.D. []  Celedonio MiyamotoJeremy Frens, Pharm.D., BCPS AQ-ID []  Garvin FilaMike Maccia, Pharm.D., BCPS []  Georgina PillionElizabeth Martin, Pharm.D., BCPS []  StatesvilleMinh Pham, [address]Ph[address]D., BCPS, AAHIVP []  Estella HuskMichelle Turner, Pharm.D., BCPS, AAHIVP []  Lysle Pearlachel Rumbarger, PharmD, BCPS []  Blake DivineShannon Parkey, PharmD []  Pollyann SamplesAndy Johnston, PharmD, BCPS Adline PotterSabrina Dunham PharmD  Positive Urine culture Treated with none, asymptomatic, no further patient follow-up is required at this time.  Berle MullMiller, Leeanne Butters 05/25/2017, 2:26 PM

## 2017-05-25 NOTE — Progress Notes (Signed)
ED Antimicrobial Stewardship Positive Culture Follow Up   Frederick Hess is an 32 y.o. adult who [facility] with a chief complaint of nausea and dizziness.  Chief Complaint  Patient presents with  . Nausea    Recent Results (from the past 720 hour(s))  Urine culture     Status: Abnormal   Collection Time: 05/22/17 12:04 PM  Result Value Ref Range Status   Specimen Description URINE, RANDOM  Final   Special Requests   Final    NONE Performed at [facility], [address] [place], [place] [address]    Culture 50,000 COLONIES/mL ENTEROCOCCUS FAECALIS (A)  Final   Report Status 05/24/2017 FINAL  Final   Organism ID, Bacteria ENTEROCOCCUS FAECALIS (A)  Final      Susceptibility   Enterococcus faecalis - MIC*    AMPICILLIN <=2 SENSITIVE Sensitive     LEVOFLOXACIN 0.5 SENSITIVE Sensitive     NITROFURANTOIN <=16 SENSITIVE Sensitive     VANCOMYCIN 1 SENSITIVE Sensitive     * 50,000 COLONIES/mL ENTEROCOCCUS FAECALIS    [x]  Patient discharged originally without antimicrobial agent.   New antibiotic prescription: No treatment. Patient has no s/sx of UTI or systemic infection.   ED Provider: Harvie HeckSamantha Petrucelli, PA-C  Adline PotterSabrina Demmi Sindt, PharmD Pharmacy Resident Pager: [phone]

## 2017-06-16 ENCOUNTER — Encounter (HOSPITAL_COMMUNITY): Payer: Self-pay | Admitting: *Deleted

## 2017-06-16 ENCOUNTER — Emergency Department (HOSPITAL_COMMUNITY)
Admission: EM | Admit: 2017-06-16 | Discharge: 2017-06-16 | Disposition: A | Discharge status: Home / Self Care | Payer: Self-pay | Attending: Emergency Medicine | Admitting: Emergency Medicine

## 2017-06-16 DIAGNOSIS — Z79899 Other long term (current) drug therapy: Secondary | ICD-10-CM | POA: Insufficient documentation

## 2017-06-16 DIAGNOSIS — Z87891 Personal history of nicotine dependence: Secondary | ICD-10-CM | POA: Insufficient documentation

## 2017-06-16 DIAGNOSIS — K529 Noninfective gastroenteritis and colitis, unspecified: Secondary | ICD-10-CM | POA: Insufficient documentation

## 2017-06-16 LAB — COMPREHENSIVE METABOLIC PANEL
ALBUMIN: 3.9 g/dL (ref 3.5–5.0)
ALK PHOS: 37 U/L — AB (ref 38–126)
ALT: 32 U/L (ref 17–63)
AST: 27 U/L (ref 15–41)
Anion gap: 9 (ref 5–15)
CO2: 27 mmol/L (ref 22–32)
Calcium: 9.1 mg/dL (ref 8.9–10.3)
Chloride: 103 mmol/L (ref 101–111)
Creatinine, Ser: 1.06 mg/dL (ref 0.61–1.24)
GFR calc Af Amer: >60 mL/min (ref >60)
GFR calc non Af Amer: >60 mL/min (ref >60)
GLUCOSE: 121 mg/dL — AB (ref 65–99)
POTASSIUM: 3.8 mmol/L (ref 3.5–5.1)
SODIUM: 139 mmol/L (ref 135–145)
TOTAL PROTEIN: 7.7 g/dL (ref 6.5–8.1)
Total Bilirubin: 1.2 mg/dL (ref 0.3–1.2)

## 2017-06-16 LAB — CBC
HEMATOCRIT: 42.6 % (ref 39.0–52.0)
HEMOGLOBIN: 14.7 g/dL (ref 13.0–17.0)
MCH: 29.3 pg (ref 26.0–34.0)
MCHC: 34.5 g/dL (ref 30.0–36.0)
MCV: 85 fL (ref 78.0–100.0)
Platelets: 148 10*3/uL — ABNORMAL LOW (ref 150–400)
RBC: 5.01 MIL/uL (ref 4.22–5.81)
RDW: 14.3 % (ref 11.5–15.5)
WBC: 5.6 10*3/uL (ref 4.0–10.5)

## 2017-06-16 LAB — URINALYSIS, ROUTINE W REFLEX MICROSCOPIC
Bilirubin Urine: NEGATIVE
Glucose, UA: NEGATIVE mg/dL
Ketones, ur: NEGATIVE mg/dL
NITRITE: NEGATIVE
PROTEIN: 30 mg/dL — AB
SPECIFIC GRAVITY, URINE: 1.016 (ref 1.005–1.030)
pH: 5 (ref 5.0–8.0)

## 2017-06-16 LAB — LIPASE, BLOOD: Lipase: 22 U/L (ref 11–51)

## 2017-06-16 MED ORDER — ONDANSETRON HCL 4 MG/2ML IJ SOLN
4.0000 mg | Freq: Once | INTRAMUSCULAR | Status: AC
  Administered 2017-06-16: 4 mg via INTRAVENOUS
  Filled 2017-06-16: qty 2

## 2017-06-16 MED ORDER — ONDANSETRON HCL 4 MG PO TABS: 4.0000 mg | ORAL_TABLET | Freq: Four times a day (QID) | ORAL | 0 refills | Status: DC

## 2017-06-16 NOTE — Discharge Instructions (Addendum)
Please read attached information. If you experience any new or worsening signs or symptoms please return to the emergency room for evaluation. Please follow-up with your primary care provider or specialist as discussed. Please use medication prescribed only as directed and discontinue taking if you have any concerning signs or symptoms.   °

## 2017-06-16 NOTE — ED Provider Notes (Signed)
[facility] EMERGENCY DEPARTMENT Provider Note  CSN: [id number] Arrival date & time: 06/16/17  1127     History   Chief Complaint Chief Complaint  Patient presents with  . Emesis    HPI Frederick Hess is a 32 y.o. adult.  HPI   22 YOF presents today with nausea vomiting diarrhea.  Patient reports 4-day history of this.  She notes minimal generalized abdominal discomfort, nonfocal.  She denies any blood in her diarrhea or vomit, denies any fever.  She notes she is able to tolerate liquids, but vomits with food.  She reports close sick contacts with similar symptoms.  Patient denies any other complaints here today.    Past Medical History:  Diagnosis Date  . Hormone replacement therapy   . Transexualism     Patient Active Problem List   Diagnosis Date Noted  . Depression (emotion) 10/30/2013  . 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 Substance abuse (HCC) 10/10/2013  . Borderline personality disorder (HCC) 10/10/2013    Past Surgical History:  Procedure Laterality Date  . COSMETIC SURGERY     rhinoplasty  . RHINOPLASTY       OB History   None      Home Medications    Prior to Admission medications   Medication Sig Start Date End Date Taking? Authorizing Provider  doxycycline (VIBRAMYCIN) 100 MG capsule Take 1 capsule (100 mg total) by mouth 2 (two) times daily. Patient not taking: Reported on 05/22/2017 04/11/16   Roxy Horseman, PA-C  ESTRADIOL PO Take 1 tablet by mouth daily.    [provider]  Estradiol Valerate (DELESTROGEN IM) Inject 1 Dose into the muscle every 14 (fourteen) days.    [provider]  ibuprofen (ADVIL,MOTRIN) 400 MG tablet Take 1 tablet (400 mg total) by mouth 3 (three) times daily. Take one tablet three times daily for three days Patient not taking: Reported on 05/22/2017 01/21/16   Gerhard Munch, MD  ondansetron (ZOFRAN) 4 MG tablet Take 1 tablet (4 mg  total) by mouth every 6 (six) hours. 06/16/17   Eyvonne Mechanic, PA-C    Family History History reviewed. No pertinent family history.  Social History Social History   Tobacco Use  . Smoking status: Former Smoker    Packs/day: 1.00  . Smokeless tobacco: Never Used  Substance Use Topics  . Alcohol use: Yes    Comment: Rarely  . Drug use: Yes    Types: Marijuana     Allergies   Amoxicillin   Review of Systems Review of Systems  All other systems reviewed and are negative.    Physical Exam Updated Vital Signs BP 136/79 (BP Location: Right Arm)   Pulse 68   Temp 98.5 F (36.9 C) (Oral)   Resp 16   SpO2 99%   Physical Exam  Constitutional: She is oriented to person, place, and time. She appears well-developed and well-nourished.  HENT:  Head: Normocephalic and atraumatic.  Eyes: Pupils are equal, round, and reactive to light. Conjunctivae are normal. Right eye exhibits no discharge. Left eye exhibits no discharge. No scleral icterus.  Neck: Normal range of motion. No JVD present. No tracheal deviation present.  Pulmonary/Chest: Effort normal. No stridor.  Abdominal: Soft. Bowel sounds are normal. She exhibits no distension and no mass. There is no tenderness. There is no rebound and no guarding. No hernia.  Neurological: She is alert and oriented to person, place, and time. Coordination normal.  Psychiatric:  She has a normal mood and affect. Her behavior is normal. Judgment and thought content normal.  Nursing note and vitals reviewed.    ED Treatments / Results  Labs (all labs ordered are listed, but only abnormal results are displayed) Labs Reviewed  COMPREHENSIVE METABOLIC PANEL - Abnormal; Notable for the following components:      Result Value   Glucose, Bld 121 (*)    BUN <5 (*)    Alkaline Phosphatase 37 (*)    All other components within normal limits  CBC - Abnormal; Notable for the following components:   Platelets 148 (*)    All other components  within normal limits  URINALYSIS, ROUTINE W REFLEX MICROSCOPIC - Abnormal; Notable for the following components:   APPearance HAZY (*)    Hgb urine dipstick SMALL (*)    Protein, ur 30 (*)    Leukocytes, UA SMALL (*)    Bacteria, UA RARE (*)    Squamous Epithelial / LPF 0-5 (*)    All other components within normal limits  LIPASE, BLOOD    EKG None  Radiology No results found.  Procedures Procedures (including critical care time)  Medications Ordered in ED Medications  ondansetron (ZOFRAN) injection 4 mg (4 mg Intravenous Given 06/16/17 1348)     Initial Impression / Assessment and Plan / ED Course  I have reviewed the triage vital signs and the nursing notes.  Pertinent labs & imaging results that were available during my care of the patient were reviewed by me and considered in my medical decision making (see chart for details).     Final Clinical Impressions(s) / ED Diagnoses   Final diagnoses:  Gastroenteritis    Labs: Lipase, CBC, CMP, UA  Imaging:  Consults:  Therapeutics: Zofran  Discharge Meds: Zofran  Assessment/Plan: 32 year old male presents today with likely viral gastroenteritis.  Well-appearing in no acute distress.  Nontender abdomen.  Afebrile with reassuring laboratory analysis.  Tolerating p.o. after Zofran discharged with strict return precautions and follow-up information.      ED Discharge Orders        Ordered    ondansetron (ZOFRAN) 4 MG tablet  Every 6 hours     06/16/17 1453       Eyvonne MechanicHedges, Karanveer Ramakrishnan, PA-C 06/16/17 1657    Pricilla LovelessGoldston, Scott, MD 06/16/17 1718

## 2017-06-16 NOTE — ED Triage Notes (Signed)
Pt in c/o n/v/d for the last several days, states others at work have been sick too, alert and oriented no distress noted

## 2017-06-16 NOTE — ED Notes (Signed)
Pt given gingerale and crackers 

## 2017-07-16 ENCOUNTER — Emergency Department (HOSPITAL_COMMUNITY)
Admission: EM | Admit: 2017-07-16 | Discharge: 2017-07-16 | Disposition: A | Discharge status: Home / Self Care | Payer: Self-pay | Attending: Emergency Medicine | Admitting: Emergency Medicine

## 2017-07-16 ENCOUNTER — Encounter (HOSPITAL_COMMUNITY): Payer: Self-pay | Admitting: Emergency Medicine

## 2017-07-16 ENCOUNTER — Other Ambulatory Visit: Payer: Self-pay

## 2017-07-16 ENCOUNTER — Emergency Department (HOSPITAL_COMMUNITY): Payer: Self-pay

## 2017-07-16 DIAGNOSIS — Z87891 Personal history of nicotine dependence: Secondary | ICD-10-CM | POA: Insufficient documentation

## 2017-07-16 DIAGNOSIS — N10 Acute tubulo-interstitial nephritis: Secondary | ICD-10-CM | POA: Insufficient documentation

## 2017-07-16 DIAGNOSIS — N12 Tubulo-interstitial nephritis, not specified as acute or chronic: Secondary | ICD-10-CM

## 2017-07-16 DIAGNOSIS — R110 Nausea: Secondary | ICD-10-CM | POA: Insufficient documentation

## 2017-07-16 DIAGNOSIS — Z79899 Other long term (current) drug therapy: Secondary | ICD-10-CM | POA: Insufficient documentation

## 2017-07-16 LAB — COMPREHENSIVE METABOLIC PANEL
ALBUMIN: 4.2 g/dL (ref 3.5–5.0)
ALK PHOS: 32 U/L — AB (ref 38–126)
ALT: 15 U/L — AB (ref 17–63)
AST: 15 U/L (ref 15–41)
Anion gap: 11 (ref 5–15)
BUN: 7 mg/dL (ref 6–20)
CALCIUM: 9.3 mg/dL (ref 8.9–10.3)
CO2: 25 mmol/L (ref 22–32)
CREATININE: 0.96 mg/dL (ref 0.61–1.24)
Chloride: 107 mmol/L (ref 101–111)
GFR calc Af Amer: >60 mL/min (ref >60)
GFR calc non Af Amer: >60 mL/min (ref >60)
GLUCOSE: 104 mg/dL — AB (ref 65–99)
POTASSIUM: 3.7 mmol/L (ref 3.5–5.1)
Sodium: 143 mmol/L (ref 135–145)
TOTAL PROTEIN: 7.9 g/dL (ref 6.5–8.1)
Total Bilirubin: 1 mg/dL (ref 0.3–1.2)

## 2017-07-16 LAB — CBC
HEMATOCRIT: 41.9 % (ref 39.0–52.0)
Hemoglobin: 14.7 g/dL (ref 13.0–17.0)
MCH: 30.9 pg (ref 26.0–34.0)
MCHC: 35.1 g/dL (ref 30.0–36.0)
MCV: 88 fL (ref 78.0–100.0)
PLATELETS: 163 10*3/uL (ref 150–400)
RBC: 4.76 MIL/uL (ref 4.22–5.81)
RDW: 14.6 % (ref 11.5–15.5)
WBC: 6.6 10*3/uL (ref 4.0–10.5)

## 2017-07-16 LAB — URINALYSIS, ROUTINE W REFLEX MICROSCOPIC
BACTERIA UA: NONE SEEN
Bilirubin Urine: NEGATIVE
GLUCOSE, UA: NEGATIVE mg/dL
Ketones, ur: NEGATIVE mg/dL
Nitrite: NEGATIVE
PH: 5 (ref 5.0–8.0)
Protein, ur: NEGATIVE mg/dL
Specific Gravity, Urine: 1.013 (ref 1.005–1.030)

## 2017-07-16 LAB — LIPASE, BLOOD: Lipase: 25 U/L (ref 11–51)

## 2017-07-16 MED ORDER — CEPHALEXIN 500 MG PO CAPS: 500.0000 mg | ORAL_CAPSULE | Freq: Three times a day (TID) | ORAL | 0 refills | Status: DC

## 2017-07-16 MED ORDER — IBUPROFEN 600 MG PO TABS: 600.0000 mg | ORAL_TABLET | Freq: Four times a day (QID) | ORAL | 0 refills | Status: DC | PRN

## 2017-07-16 MED ORDER — MORPHINE SULFATE (PF) 4 MG/ML IV SOLN
4.0000 mg | Freq: Once | INTRAVENOUS | Status: AC
  Administered 2017-07-16: 4 mg via INTRAVENOUS
  Filled 2017-07-16: qty 1

## 2017-07-16 MED ORDER — ONDANSETRON HCL 4 MG/2ML IJ SOLN
4.0000 mg | Freq: Once | INTRAMUSCULAR | Status: AC
  Administered 2017-07-16: 4 mg via INTRAVENOUS
  Filled 2017-07-16: qty 2

## 2017-07-16 MED ORDER — SODIUM CHLORIDE 0.9 % IV SOLN
1.0000 g | Freq: Once | INTRAVENOUS | Status: DC
  Filled 2017-07-16: qty 10

## 2017-07-16 MED ORDER — TRAMADOL HCL 50 MG PO TABS: 50.0000 mg | ORAL_TABLET | Freq: Four times a day (QID) | ORAL | 0 refills | Status: DC | PRN

## 2017-07-16 MED ORDER — SODIUM CHLORIDE 0.9 % IV SOLN
1.0000 g | Freq: Once | INTRAVENOUS | Status: AC
  Administered 2017-07-16: 1 g via INTRAVENOUS
  Filled 2017-07-16: qty 10

## 2017-07-16 NOTE — ED Triage Notes (Signed)
Patient here from home with complaints of left sided abdominal pain radiating around to flank for "almost a week". Pain 8/10. Denies nausea, vomiting.

## 2017-07-16 NOTE — ED Notes (Signed)
Spoke with PA, MD and pharmacy in regards to rocephin administration with hx of reaction to amoxicillin. Per PA- pt does not have a hx of anaphylaxis to amoxicillin, monitor closely. Spoke with pharmacy--reports pt had IM rocephin with no reaction in March.

## 2017-07-16 NOTE — ED Notes (Signed)
Pt in CT.

## 2017-07-16 NOTE — Discharge Instructions (Addendum)
Take ibuprofen for pain.  Tramadol for severe pain.  Take Keflex as prescribed until all gone for infection.  Follow-up with your doctor for recheck.  Return if worsening

## 2017-07-16 NOTE — ED Notes (Signed)
Rocephin malfunction/leaked when trying to prepare for administration- d/c previous order in order to remove new rocephin

## 2017-07-16 NOTE — ED Notes (Signed)
Pt did not have urine culture obtained, will need to recollect.

## 2017-07-16 NOTE — ED Provider Notes (Signed)
[facility]-EMERGENCY DEPT Provider Note   CSN: [id number] Arrival date & time: 07/16/17  1141     History   Chief Complaint Chief Complaint  Patient presents with  . Abdominal Pain  . Flank Pain    HPI Frederick Hess is a 32 y.o. adult.  HPI Frederick Hess is a 32 y.o. adult with hx of transsexualism, presents to ED with complaint of left flank pain. States pain started a week ago, gradually getting worse. States pain is sharp, radiates into lower abdomen. Associated nausea. No vomiting. No diarrhea or constipation. Reports pressure when urinating. Reports no penile discharge. No fever, chills, malaise. Not taking anything for pain. No hx of similar symptoms in the past.   Past Medical History:  Diagnosis Date  . Hormone replacement therapy   . Transexualism     Patient Active Problem List   Diagnosis Date Noted  . Depression (emotion) 10/30/2013  . 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 Substance abuse (HCC) 10/10/2013  . Borderline personality disorder (HCC) 10/10/2013    Past Surgical History:  Procedure Laterality Date  . COSMETIC SURGERY     rhinoplasty  . RHINOPLASTY       OB History   None      Home Medications    Prior to Admission medications   Medication Sig Start Date End Date Taking? Authorizing Provider  ESTRADIOL PO Take 1 tablet by mouth daily.   Yes [provider]  doxycycline (VIBRAMYCIN) 100 MG capsule Take 1 capsule (100 mg total) by mouth 2 (two) times daily. Patient not taking: Reported on 05/22/2017 04/11/16   Roxy Horseman, PA-C  ibuprofen (ADVIL,MOTRIN) 400 MG tablet Take 1 tablet (400 mg total) by mouth 3 (three) times daily. Take one tablet three times daily for three days Patient not taking: Reported on 05/22/2017 01/21/16   Gerhard Munch, MD  ondansetron (ZOFRAN) 4 MG tablet Take 1 tablet (4 mg total) by mouth every 6 (six) hours. Patient not taking:  Reported on 07/16/2017 06/16/17   Eyvonne Mechanic, PA-C    Family History No family history on file.  Social History Social History   Tobacco Use  . Smoking status: Former Smoker    Packs/day: 1.00  . Smokeless tobacco: Never Used  Substance Use Topics  . Alcohol use: Yes    Comment: Rarely  . Drug use: Yes    Types: Marijuana     Allergies   Amoxicillin   Review of Systems Review of Systems  Constitutional: Negative for chills and fever.  Respiratory: Negative for cough, chest tightness and shortness of breath.   Cardiovascular: Negative for chest pain, palpitations and leg swelling.  Gastrointestinal: Positive for abdominal pain. Negative for diarrhea, nausea and vomiting.  Genitourinary: Positive for dysuria and flank pain. Negative for frequency, hematuria and urgency.  Musculoskeletal: Negative for arthralgias, myalgias, neck pain and neck stiffness.  Skin: Negative for rash.  Neurological: Negative for dizziness, weakness and headaches.  All other systems reviewed and are negative.    Physical Exam Updated Vital Signs BP 134/82 (BP Location: Right Arm)   Pulse 99   Temp 98.2 F (36.8 C) (Oral)   Resp 18   SpO2 98%   Physical Exam  Constitutional: She appears well-developed and well-nourished. No distress.  HENT:  Head: Normocephalic and atraumatic.  Eyes: Conjunctivae are normal.  Neck: Neck supple.  Cardiovascular: Normal rate, regular rhythm and normal heart sounds.  Pulmonary/Chest: Effort normal. No  respiratory distress. She has no wheezes. She has no rales.  Abdominal: Soft. Bowel sounds are normal. She exhibits no distension. There is no tenderness. There is no rebound.  Left CVA tenderness  Musculoskeletal: She exhibits no edema.  Neurological: She is alert.  Skin: Skin is warm and dry.  Nursing note and vitals reviewed.    ED Treatments / Results  Labs (all labs ordered are listed, but only abnormal results are displayed) Labs Reviewed    COMPREHENSIVE METABOLIC PANEL - Abnormal; Notable for the following components:      Result Value   Glucose, Bld 104 (*)    ALT 15 (*)    Alkaline Phosphatase 32 (*)    All other components within normal limits  URINALYSIS, ROUTINE W REFLEX MICROSCOPIC - Abnormal; Notable for the following components:   Hgb urine dipstick SMALL (*)    Leukocytes, UA MODERATE (*)    All other components within normal limits  URINE CULTURE  LIPASE, BLOOD  CBC    EKG None  Radiology Ct Renal Stone Study  Result Date: 07/16/2017 CLINICAL DATA:  Left flank region pain EXAM: CT ABDOMEN AND PELVIS WITHOUT CONTRAST TECHNIQUE: Multidetector CT imaging of the abdomen and pelvis was performed following the standard protocol without oral or IV contrast. COMPARISON:  January 21, 2016 FINDINGS: Lower chest: Lung bases are clear. Hepatobiliary: No focal liver lesions are evident on this noncontrast enhanced study. There is cholelithiasis. Gallbladder wall does not appear thickened. There is no biliary duct dilatation. Pancreas: No pancreatic mass or inflammatory focus. Spleen: No splenic lesions are evident. Adrenals/Urinary Tract: Adrenals bilaterally appear normal. Kidneys bilaterally show no evident mass or hydronephrosis on either side. There is no appreciable renal or ureteral calculus on either side. Urinary bladder wall is mildly thickened in a generalized manner. Stomach/Bowel: Rectal wall is mildly thickened without with slight perirectal fat stranding. No abscess or fluid collection seen in this area. There is no appreciable bowel wall thickening elsewhere. No evident bowel obstruction. No free air or portal venous air evident. Vascular/Lymphatic: No abdominal aortic aneurysm. No vascular lesions are evident. There is no appreciable adenopathy in the abdomen or pelvis by size criteria. There are multiple subcentimeter mesenteric lymph nodes, regarded as nonspecific and stable. Reproductive: Prostate appears  unremarkable. Testes are again noted in the inguinal canal regions on each side, stable. No pelvic mass evident. Other: Appendix appears unremarkable. No abscess or ascites evident in the abdomen or pelvis. Musculoskeletal: There is extensive subcutaneous stranding and fat reticulation throughout the posterior and lateral aspects of the pelvis, stable. No intramuscular lesions are evident. No evident blastic or lytic bone lesions. IMPRESSION: 1. Wall thickening of the urinary bladder, likely due to a degree of cystitis. No renal or ureteral calculus. No hydronephrosis on either side. 2. Mild rectal wall thickening with mild perirectal soft tissue stranding. Suspect a degree of proctitis. No abscess or fluid collection in the perirectal region. 3. No bowel obstruction. No abscess. No appreciable appendiceal region inflammation. 4.  Cholelithiasis.  No gallbladder wall thickening. 5. Extensive stranding and fat reticulation throughout the lateral and posterior aspects of the pelvis, stable. 6.  Each testis is located in the respective inguinal canal. Electronically Signed   By: Bretta Bang III M.D.   On: 07/16/2017 14:07    Procedures Procedures (including critical care time)  Medications Ordered in ED Medications  cefTRIAXone (ROCEPHIN) 1 g in sodium chloride 0.9 % 100 mL IVPB (has no administration in time range)  morphine  4 MG/ML injection 4 mg (has no administration in time range)  ondansetron (ZOFRAN) injection 4 mg (has no administration in time range)     Initial Impression / Assessment and Plan / ED Course  I have reviewed the triage vital signs and the nursing notes.  Pertinent labs & imaging results that were available during my care of the patient were reviewed by me and considered in my medical decision making (see chart for details).     Pt in ED with complaint of left flank pain, dysuria. Labs obtained at triage are unremarkable.  Normal white blood cell count.  Urinalysis  shows moderate leukocytes, 21-50 WBCs.  This is concerning for possible infection.  I will send cultures.  In setting of CVA tenderness and pain, I will cover with antibiotics for possible pyelonephritis.  CT renal to rule out kidney stone.  3:12 PM CT negative for stone.  She does show some inflammation around the bladder, consistent with cystitis.  Patient is already receiving Rocephin for infection.  Also showed some inflammation around the rectum, concerning for proctitis.  I discussed this with patient, she her denies any pain in her rectum.  She denies any rectal discharge.  Plan to administer Rocephin here and discharged home with Keflex for presumed pyelonephritis.  Patient is well-appearing otherwise and is afebrile, nontoxic, no vomiting, appropriate for at home treatment.  I did send culture of the urine.  Return precautions discussed.  Follow-up with PCP for recheck of the urine in 1 week.  Vitals:   07/16/17 1147 07/16/17 1405  BP: 134/82 130/85  Pulse: 99 63  Resp: 18 14  Temp: 98.2 F (36.8 C)   TempSrc: Oral   SpO2: 98% 100%     Final Clinical Impressions(s) / ED Diagnoses   Final diagnoses:  Pyelonephritis    ED Discharge Orders        Ordered    cephALEXin (KEFLEX) 500 MG capsule  3 times daily     07/16/17 1526    traMADol (ULTRAM) 50 MG tablet  Every 6 hours PRN     07/16/17 1526    ibuprofen (ADVIL,MOTRIN) 600 MG tablet  Every 6 hours PRN     07/16/17 1526       Jaynie Crumble, PA-C 07/16/17 1527    Lorre Nick, MD 07/18/17 2313

## 2017-07-19 LAB — URINE CULTURE

## 2017-07-20 ENCOUNTER — Telehealth: Payer: Self-pay | Admitting: Emergency Medicine

## 2017-07-20 NOTE — Telephone Encounter (Signed)
Post ED Visit - Positive Culture Follow-up: Successful Patient Follow-Up  Culture assessed and recommendations reviewed by:  Enzo Bi, Pharm.D.  Celedonio Miyamoto, Pharm.D., BCPS AQ-ID  Garvin Fila, Pharm.D., BCPS  Georgina Pillion, [address] BCPS  [place], [address]D., BCPS, AAHIVP  Estella Husk, Pharm.D., BCPS, AAHIVP  Lysle Pearl, PharmD, BCPS  Sherlynn Carbon, PharmD  Pollyann Samples, PharmD, BCPS  Positive urine culture   Patient discharged without antimicrobial prescription and treatment is now indicated  Organism is resistant to prescribed ED discharge antimicrobial  Patient with positive blood cultures  Changes discussed with ED provider: Leary Roca PharmD New antibiotic prescription d/c cephalexin, start ciprofloxacin  po twice daily x 5 days #10  Attempting to contact patient   Berle Mull 07/20/2017, 11:50 AM

## 2017-07-31 ENCOUNTER — Encounter (HOSPITAL_COMMUNITY): Payer: Self-pay

## 2017-07-31 ENCOUNTER — Other Ambulatory Visit: Payer: Self-pay

## 2017-07-31 ENCOUNTER — Emergency Department (HOSPITAL_COMMUNITY)
Admission: EM | Admit: 2017-07-31 | Discharge: 2017-07-31 | Disposition: A | Discharge status: Home / Self Care | Payer: Self-pay | Attending: Emergency Medicine | Admitting: Emergency Medicine

## 2017-07-31 DIAGNOSIS — N3000 Acute cystitis without hematuria: Secondary | ICD-10-CM | POA: Insufficient documentation

## 2017-07-31 DIAGNOSIS — Z79899 Other long term (current) drug therapy: Secondary | ICD-10-CM | POA: Insufficient documentation

## 2017-07-31 DIAGNOSIS — R369 Urethral discharge, unspecified: Secondary | ICD-10-CM | POA: Insufficient documentation

## 2017-07-31 DIAGNOSIS — Z87891 Personal history of nicotine dependence: Secondary | ICD-10-CM | POA: Insufficient documentation

## 2017-07-31 LAB — URINALYSIS, ROUTINE W REFLEX MICROSCOPIC
Bacteria, UA: NONE SEEN
Bilirubin Urine: NEGATIVE
GLUCOSE, UA: NEGATIVE mg/dL
Hgb urine dipstick: NEGATIVE
Ketones, ur: NEGATIVE mg/dL
NITRITE: NEGATIVE
PH: 6 (ref 5.0–8.0)
Protein, ur: NEGATIVE mg/dL
Specific Gravity, Urine: 1.013 (ref 1.005–1.030)

## 2017-07-31 MED ORDER — CEPHALEXIN 500 MG PO CAPS: 500.0000 mg | ORAL_CAPSULE | Freq: Three times a day (TID) | ORAL | 0 refills | Status: AC

## 2017-07-31 MED ORDER — CEFTRIAXONE SODIUM 250 MG IJ SOLR
250.0000 mg | Freq: Once | INTRAMUSCULAR | Status: AC
  Administered 2017-07-31: 250 mg via INTRAMUSCULAR
  Filled 2017-07-31: qty 250

## 2017-07-31 MED ORDER — AZITHROMYCIN 250 MG PO TABS
1000.0000 mg | ORAL_TABLET | Freq: Once | ORAL | Status: AC
Start: 2017-07-31 — End: 2017-07-31
  Administered 2017-07-31: 1000 mg via ORAL
  Filled 2017-07-31: qty 4

## 2017-07-31 MED ORDER — LIDOCAINE HCL 1 % IJ SOLN
INTRAMUSCULAR | Status: AC
  Administered 2017-07-31: 2 mL
  Filled 2017-07-31: qty 20

## 2017-07-31 NOTE — ED Notes (Signed)
Bed: WA14 Expected date:  Expected time:  Means of arrival:  Comments: 

## 2017-07-31 NOTE — ED Triage Notes (Signed)
She tells me she has recently been treated for "uti" ~ 2 weeks ago. She got better and at that time had had some left flank pain. The flank pain has resolved. She is here today with c/o bladder area pain since yesterday evening.

## 2017-07-31 NOTE — Discharge Instructions (Signed)
You have STD testing pending and will be called in 2-3 days if any results are positive.  Please make sure you use protection, and notify any partners if you have positive test results.  Your urine is also concerning for infection, given that you do have history of urinary tract infection will treat with Keflex, please take this as directed.  You were treated prophylactically today for gonorrhea and chlamydia.  You can follow-up at the health department for any future STD testing.  Return to the emergency department for any fevers, nausea, vomiting, abdominal pain, worsening urinary symptoms or any other new or concerning symptoms.

## 2017-07-31 NOTE — ED Provider Notes (Signed)
[facility]-EMERGENCY DEPT Provider Note   CSN: [id number] Arrival date & time: 07/31/17  1159     History   Chief Complaint Chief Complaint  Patient presents with  . Abdominal Pain    HPI DEFORREST BOGLE is a 32 y.o. adult.  ADELFO DIEBEL is a 32 y.o. Adult, currently transitioning from male to male, with history of depression, borderline personality disorder and substance abuse, who presents to the emergency department for evaluation of suprapubic discomfort and dysuria.  Patient reports 2 weeks ago she was treated for UTI, at that time she had some left flank pain, she took entire course of antibiotics and symptoms resolved within 1 week.  Patient reports last night she started to notice some suprapubic discomfort, and urinary frequency.  Patient denies any flank pain.  Patient is currently on hormone therapy for male to male transition, but has not undergone any surgery, she does report some penile discharge over the past 2 to 3 days and some mild dysuria.  Patient denies any fevers or chills, no nausea, vomiting, abdominal pain, rectal pain or pain with defecation.  Patient is sexually active and does not regularly use protection.     Past Medical History:  Diagnosis Date  . Hormone replacement therapy   . Transexualism     Patient Active Problem List   Diagnosis Date Noted  . Depression (emotion) 10/30/2013  . 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 Substance abuse (HCC) 10/10/2013  . Borderline personality disorder (HCC) 10/10/2013    Past Surgical History:  Procedure Laterality Date  . COSMETIC SURGERY     rhinoplasty  . RHINOPLASTY       OB History   None      Home Medications    Prior to Admission medications   Medication Sig Start Date End Date Taking? Authorizing Provider  cephALEXin (KEFLEX) 500 MG capsule Take 1 capsule (500 mg total) by mouth 3 (three) times daily. 07/16/17    Kirichenko, Lemont Fillers, PA-C  doxycycline (VIBRAMYCIN) 100 MG capsule Take 1 capsule (100 mg total) by mouth 2 (two) times daily. Patient not taking: Reported on 05/22/2017 04/11/16   Roxy Horseman, PA-C  ESTRADIOL PO Take 1 tablet by mouth daily.    [provider]  ibuprofen (ADVIL,MOTRIN) 600 MG tablet Take 1 tablet (600 mg total) by mouth every 6 (six) hours as needed. 07/16/17   Kirichenko, Tatyana, PA-C  ondansetron (ZOFRAN) 4 MG tablet Take 1 tablet (4 mg total) by mouth every 6 (six) hours. Patient not taking: Reported on 07/16/2017 06/16/17   Hedges, Tinnie Gens, PA-C  traMADol (ULTRAM) 50 MG tablet Take 1 tablet (50 mg total) by mouth every 6 (six) hours as needed. 07/16/17   Jaynie Crumble, PA-C    Family History No family history on file.  Social History Social History   Tobacco Use  . Smoking status: Former Smoker    Packs/day: 1.00  . Smokeless tobacco: Never Used  Substance Use Topics  . Alcohol use: Yes    Comment: Rarely  . Drug use: Yes    Types: Marijuana     Allergies   Amoxicillin   Review of Systems Review of Systems  Constitutional: Negative for chills and fever.  Respiratory: Negative for shortness of breath.   Cardiovascular: Negative for chest pain.  Gastrointestinal: Negative for abdominal pain, diarrhea, nausea and vomiting.  Genitourinary: Positive for dysuria and frequency. Negative for flank pain, genital sores and hematuria.  Penile discharge  Musculoskeletal: Negative for arthralgias, gait problem and myalgias.  Skin: Negative for color change and wound.  Neurological: Negative for syncope and light-headedness.  All other systems reviewed and are negative.    Physical Exam Updated Vital Signs BP (!) 133/91 (BP Location: Left Arm)   Pulse 65   Temp 98.1 F (36.7 C) (Oral)   Resp 18   SpO2 100%   Physical Exam  Constitutional: She appears well-developed and well-nourished. No distress.  HENT:  Head: Normocephalic and  atraumatic.  Mouth/Throat: Oropharynx is clear and moist.  Eyes: Right eye exhibits no discharge. Left eye exhibits no discharge.  Neck: Neck supple.  Cardiovascular: Normal rate, regular rhythm, normal heart sounds and intact distal pulses.  Pulmonary/Chest: Effort normal and breath sounds normal. No stridor. No respiratory distress. She has no wheezes. She has no rales.  Abdominal: Soft. Bowel sounds are normal. She exhibits no distension and no mass. There is no tenderness. There is no guarding.  Abdomen soft, nondistended, nontender to palpation in all quadrants without guarding or peritoneal signs, no CVA tenderness bilaterally  Genitourinary:  Genitourinary Comments: Chaperone present for general exam. No external genital lesions noted. No penile tenderness, erythema or swelling, no discharge present at the urinary meatus, no testicular tenderness, no scrotal erythema or swelling  Musculoskeletal: She exhibits no edema or deformity.  Neurological: She is alert. Coordination normal.  Skin: Skin is warm and dry. Capillary refill takes less than 2 seconds. She is not diaphoretic.  Psychiatric: She has a normal mood and affect. Her behavior is normal.  Nursing note and vitals reviewed.    ED Treatments / Results  Labs (all labs ordered are listed, but only abnormal results are displayed) Labs Reviewed  URINALYSIS, ROUTINE W REFLEX MICROSCOPIC - Abnormal; Notable for the following components:      Result Value   APPearance HAZY (*)    Leukocytes, UA LARGE (*)    All other components within normal limits  URINE CULTURE  HIV ANTIBODY (ROUTINE TESTING)  RPR  GC/CHLAMYDIA PROBE AMP ([place]) NOT AT [facility]    EKG None  Radiology No results found.  Procedures Procedures (including critical care time)  Medications Ordered in ED Medications  cefTRIAXone (ROCEPHIN) injection 250 mg (has no administration in time range)  azithromycin (ZITHROMAX) tablet 1,000 mg (has no  administration in time range)     Initial Impression / Assessment and Plan / ED Course  I have reviewed the triage vital signs and the nursing notes.  Pertinent labs & imaging results that were available during my care of the patient were reviewed by me and considered in my medical decision making (see chart for details).  Patient is afebrile without abdominal tenderness, abdominal pain or painful bowel movements to indicate prostatitis.  No tenderness to palpation of the testes or epididymis to suggest orchitis or epididymitis.  STD cultures obtained including HIV, syphilis, gonorrhea and chlamydia. Patient to be discharged with instructions to follow up with PCP. Discussed importance of using protection when sexually active. Pt understands that they have GC/Chlamydia cultures pending and that they will need to inform all sexual partners if results return positive. Patient has been treated prophylactically with azithromycin and Rocephin.  Patient's urine does show large amount of leukocytes, patient did have recent pyelonephritis infection, we will also treat with Keflex prescription, and urine culture has been sent.  Return precautions discussed with the patient they expressed understanding and agreement with plan.     Final Clinical  Impressions(s) / ED Diagnoses   Final diagnoses:  Acute cystitis without hematuria  Penile discharge    ED Discharge Orders        Ordered    cephALEXin (KEFLEX) 500 MG capsule  3 times daily     07/31/17 1648       Dartha Lodge, [place] 07/31/17 1659    Loren Racer, MD 08/01/17 [phone]

## 2017-08-01 LAB — URINE CULTURE: CULTURE: NO GROWTH

## 2017-08-01 LAB — RPR: RPR: NONREACTIVE

## 2017-08-01 LAB — HIV ANTIBODY (ROUTINE TESTING W REFLEX): HIV Screen 4th Generation wRfx: NONREACTIVE

## 2017-08-02 LAB — GC/CHLAMYDIA PROBE AMP (~~LOC~~) NOT AT ARMC
CHLAMYDIA, DNA PROBE: NEGATIVE
NEISSERIA GONORRHEA: NEGATIVE

## 2017-09-12 ENCOUNTER — Encounter (HOSPITAL_COMMUNITY): Payer: Self-pay | Admitting: Emergency Medicine

## 2017-09-12 ENCOUNTER — Emergency Department (HOSPITAL_COMMUNITY)
Admission: EM | Admit: 2017-09-12 | Discharge: 2017-09-12 | Disposition: A | Discharge status: Home / Self Care | Payer: Self-pay | Attending: Emergency Medicine | Admitting: Emergency Medicine

## 2017-09-12 DIAGNOSIS — N390 Urinary tract infection, site not specified: Secondary | ICD-10-CM | POA: Insufficient documentation

## 2017-09-12 DIAGNOSIS — Z79899 Other long term (current) drug therapy: Secondary | ICD-10-CM | POA: Insufficient documentation

## 2017-09-12 DIAGNOSIS — Z87891 Personal history of nicotine dependence: Secondary | ICD-10-CM | POA: Insufficient documentation

## 2017-09-12 LAB — URINALYSIS, ROUTINE W REFLEX MICROSCOPIC
BILIRUBIN URINE: NEGATIVE
Bacteria, UA: NONE SEEN
Glucose, UA: NEGATIVE mg/dL
Hgb urine dipstick: NEGATIVE
KETONES UR: NEGATIVE mg/dL
Nitrite: NEGATIVE
PROTEIN: NEGATIVE mg/dL
Specific Gravity, Urine: 1.013 (ref 1.005–1.030)
pH: 5 (ref 5.0–8.0)

## 2017-09-12 MED ORDER — CIPROFLOXACIN HCL 500 MG PO TABS: 500.0000 mg | ORAL_TABLET | Freq: Two times a day (BID) | ORAL | 0 refills | Status: DC

## 2017-09-12 NOTE — ED Provider Notes (Signed)
[facility]-EMERGENCY DEPT Provider Note   CSN: [id number] Arrival date & time: 09/12/17  1013     History   Chief Complaint Chief Complaint  Patient presents with  . Pelvic Pain    HPI Frederick Hess is a 32 y.o. adult who reports he is currently transitioning from male to male presents to the ED with c/o burning with ejaculation. Patient was treated with Zithromax 2 days ago and is awaiting results of STI results. Hx of UTI one month ago and patient request urine to be checked for infection because the did not check a urine at the health department.   HPI  Past Medical History:  Diagnosis Date  . Hormone replacement therapy   . Transexualism     Patient Active Problem List   Diagnosis Date Noted  . Depression (emotion) 10/30/2013  . 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 Substance abuse (HCC) 10/10/2013  . Borderline personality disorder (HCC) 10/10/2013    Past Surgical History:  Procedure Laterality Date  . COSMETIC SURGERY     rhinoplasty  . RHINOPLASTY       OB History   None      Home Medications    Prior to Admission medications   Medication Sig Start Date End Date Taking? Authorizing Provider  ciprofloxacin (CIPRO) 500 MG tablet Take 1 tablet (500 mg total) by mouth every 12 (twelve) hours. 09/12/17   Janne Napoleon, NP  ESTRADIOL PO Take 1 tablet by mouth daily.    [provider]    Family History No family history on file.  Social History Social History   Tobacco Use  . Smoking status: Former Smoker    Packs/day: 1.00  . Smokeless tobacco: Never Used  Substance Use Topics  . Alcohol use: Yes    Comment: Rarely  . Drug use: Yes    Types: Marijuana     Allergies   Amoxicillin   Review of Systems Review of Systems  Gastrointestinal:       Suprapubic pressure  Genitourinary:       Pain and blood with ejaculation   All other systems reviewed and are  negative.    Physical Exam Updated Vital Signs BP 137/80 (BP Location: Right Arm)   Pulse 64   Temp 98.7 F (37.1 C) (Oral)   Resp 18   SpO2 100%   Physical Exam  Constitutional: She appears well-developed and well-nourished.  HENT:  Head: Normocephalic.  Eyes: EOM are normal.  Neck: Neck supple.  Cardiovascular: Normal rate.  Pulmonary/Chest: Effort normal.  Abdominal: Soft. There is tenderness in the suprapubic area. There is no CVA tenderness.  Tenderness is mild, no guarding or rebound  Genitourinary:  Genitourinary Comments: Not examined. Patient reports full exam and cultures done 2 days ago at [facility].  Musculoskeletal: Normal range of motion.  Neurological: She is alert.  Skin: Skin is warm and dry.  Psychiatric: She has a normal mood and affect.  Nursing note and vitals reviewed.    ED Treatments / Results  Labs (all labs ordered are listed, but only abnormal results are displayed) Labs Reviewed  URINALYSIS, ROUTINE W REFLEX MICROSCOPIC - Abnormal; Notable for the following components:      Result Value   Leukocytes, UA SMALL (*)    All other components within normal limits  URINE CULTURE    Radiology No results found.  Procedures Procedures (including critical care time)  Medications Ordered in ED Medications -  No data to display   Initial Impression / Assessment and Plan / ED Course  I have reviewed the triage vital signs and the nursing notes. 32 y.o. adult here with UTI symptoms stable for d/c and does not appear toxic, does not have pyelo. Will treat with Cipro and patient to f/u with PCP or [facility]. Urine sent for culture.   Final Clinical Impressions(s) / ED Diagnoses   Final diagnoses:  Lower urinary tract infectious disease    ED Discharge Orders        Ordered    ciprofloxacin (CIPRO) 500 MG tablet  Every 12 hours     09/12/17 [address]       Zion Ta, TenstrikeHope M, NP 09/12/17 2259    Jacalyn LefevreHaviland, Julie, MD 09/15/17 [phone]

## 2017-09-12 NOTE — ED Notes (Signed)
Bed: WTR6 Expected date:  Expected time:  Means of arrival:  Comments: 

## 2017-09-12 NOTE — Discharge Instructions (Signed)
We have sent the urine for culture. While it is pending I am giving you antibiotics to take for UTI. Follow up with the health department.

## 2017-09-12 NOTE — ED Triage Notes (Signed)
Patient here from home with complaints of lower pelvic pain. Reports that she just got over a UTI and thinks that maybe with antibiotics didn't help. Denies urinary symptoms.

## 2017-09-12 NOTE — ED Triage Notes (Addendum)
Pt reports that he is transgender transitioning. Reports burning on ejaculation. Treated with Zithromax 500 mg 2 days ago. Awaiting results of swab and blood tests. Completed antibiotics for hx of UTI -dx one month ago. Denies NVD

## 2017-09-13 ENCOUNTER — Emergency Department (HOSPITAL_COMMUNITY)
Admission: EM | Admit: 2017-09-13 | Discharge: 2017-09-13 | Disposition: A | Discharge status: Home / Self Care | Payer: Self-pay | Attending: Emergency Medicine | Admitting: Emergency Medicine

## 2017-09-13 ENCOUNTER — Other Ambulatory Visit: Payer: Self-pay

## 2017-09-13 ENCOUNTER — Encounter (HOSPITAL_COMMUNITY): Payer: Self-pay

## 2017-09-13 ENCOUNTER — Emergency Department (HOSPITAL_COMMUNITY): Payer: Self-pay

## 2017-09-13 DIAGNOSIS — Z87891 Personal history of nicotine dependence: Secondary | ICD-10-CM | POA: Insufficient documentation

## 2017-09-13 DIAGNOSIS — R1031 Right lower quadrant pain: Secondary | ICD-10-CM | POA: Insufficient documentation

## 2017-09-13 DIAGNOSIS — R109 Unspecified abdominal pain: Secondary | ICD-10-CM

## 2017-09-13 DIAGNOSIS — Z79899 Other long term (current) drug therapy: Secondary | ICD-10-CM | POA: Insufficient documentation

## 2017-09-13 LAB — URINALYSIS, ROUTINE W REFLEX MICROSCOPIC
Bilirubin Urine: NEGATIVE
Glucose, UA: NEGATIVE mg/dL
Ketones, ur: NEGATIVE mg/dL
Leukocytes, UA: NEGATIVE
Nitrite: NEGATIVE
Protein, ur: NEGATIVE mg/dL
Specific Gravity, Urine: 1.008 (ref 1.005–1.030)
pH: 6 (ref 5.0–8.0)

## 2017-09-13 LAB — COMPREHENSIVE METABOLIC PANEL
ALBUMIN: 4.2 g/dL (ref 3.5–5.0)
ALT: 15 U/L (ref 0–44)
AST: 18 U/L (ref 15–41)
Alkaline Phosphatase: 27 U/L — ABNORMAL LOW (ref 38–126)
Anion gap: 7 (ref 5–15)
BUN: 10 mg/dL (ref 6–20)
CHLORIDE: 106 mmol/L (ref 98–111)
CO2: 27 mmol/L (ref 22–32)
CREATININE: 1.01 mg/dL (ref 0.61–1.24)
Calcium: 8.8 mg/dL — ABNORMAL LOW (ref 8.9–10.3)
GFR calc Af Amer: >60 mL/min (ref >60)
GLUCOSE: 92 mg/dL (ref 70–99)
Potassium: 4.1 mmol/L (ref 3.5–5.1)
Sodium: 140 mmol/L (ref 135–145)
Total Bilirubin: 0.8 mg/dL (ref 0.3–1.2)
Total Protein: 7.6 g/dL (ref 6.5–8.1)

## 2017-09-13 LAB — CBC WITH DIFFERENTIAL/PLATELET
Basophils Absolute: 0 10*3/uL (ref 0.0–0.1)
Basophils Relative: 0 %
EOS PCT: 1 %
Eosinophils Absolute: 0.1 10*3/uL (ref 0.0–0.7)
HEMATOCRIT: 45.6 % (ref 39.0–52.0)
Hemoglobin: 16.1 g/dL (ref 13.0–17.0)
LYMPHS PCT: 28 %
Lymphs Abs: 1.4 10*3/uL (ref 0.7–4.0)
MCH: 31.2 pg (ref 26.0–34.0)
MCHC: 35.3 g/dL (ref 30.0–36.0)
MCV: 88.4 fL (ref 78.0–100.0)
Monocytes Absolute: 0.2 10*3/uL (ref 0.1–1.0)
Monocytes Relative: 4 %
NEUTROS ABS: 3.3 10*3/uL (ref 1.7–7.7)
Neutrophils Relative %: 67 %
PLATELETS: 142 10*3/uL — AB (ref 150–400)
RBC: 5.16 MIL/uL (ref 4.22–5.81)
RDW: 12.5 % (ref 11.5–15.5)
WBC: 5 10*3/uL (ref 4.0–10.5)

## 2017-09-13 LAB — URINE CULTURE: Culture: NO GROWTH

## 2017-09-13 LAB — LIPASE, BLOOD: LIPASE: 27 U/L (ref 11–51)

## 2017-09-13 MED ORDER — NAPROXEN 500 MG PO TABS: 500.0000 mg | ORAL_TABLET | Freq: Two times a day (BID) | ORAL | 0 refills | Status: DC

## 2017-09-13 MED ORDER — FENTANYL CITRATE (PF) 100 MCG/2ML IJ SOLN
50.0000 ug | Freq: Once | INTRAMUSCULAR | Status: AC
  Administered 2017-09-13: 50 ug via INTRAVENOUS
  Filled 2017-09-13: qty 2

## 2017-09-13 MED ORDER — ONDANSETRON HCL 4 MG/2ML IJ SOLN
4.0000 mg | Freq: Once | INTRAMUSCULAR | Status: AC
  Administered 2017-09-13: 4 mg via INTRAVENOUS
  Filled 2017-09-13: qty 2

## 2017-09-13 MED ORDER — MORPHINE SULFATE (PF) 4 MG/ML IV SOLN
4.0000 mg | Freq: Once | INTRAVENOUS | Status: AC
Start: 2017-09-13 — End: 2017-09-13
  Administered 2017-09-13: 4 mg via INTRAVENOUS
  Filled 2017-09-13: qty 1

## 2017-09-13 NOTE — ED Provider Notes (Signed)
[facility]-EMERGENCY DEPT Provider Note   CSN: [id number] Arrival date & time: 09/13/17  1052     History   Chief Complaint Chief Complaint  Patient presents with  . Flank Pain    HPI Frederick Hess is a 32 y.o. adult who is currently transitioning from male to a male with ERT presents emergency department today for right sided flank pain.  Patient reports that he was seen here yesterday for urinary symptoms including burning with urination, urinary frequency and was diagnosed with a UTI. Urine culture is pending. Patient also reports that he had an episode of burning with ejaculation after intercourse a few days ago.  The patient notes this is not recurrent but made him concerned. The patient reports that they were seen at the health department 2 days ago and treated with azithromycin and are awaiting test results.  He notes he was tested for "everything". Patient reports he had a history of UTI 1 month ago.  Appears on culture report on 5/3 he grew greater than 100,000 enterococcus was sensitive to the ciprofloxacin he was prescribed yesterday (he has grew enterococcus multiple times).  Patient reports they are now having right flank/lower back pain that radiates into the right lower abdomen.  Denies this radiating into the genitalia area.  He denies any penile pain, penile discharge, testicular pain/swelling, ulcers/lesions.  Patient is sexually active with one male partner.  Patient denies history of kidney stones.  No prior abdominal surgeries.  He does he is still having normal bowel movements and passing gas.  He denies any fever, chills, nausea/vomiting/diarrhea, hematuria, dysuria.  Patient is on estrogen replacement therapy but denies any chest pain, shortness of breath, cough, hemoptysis, lower leg swelling or other symptoms to make me concern for PE.   HPI  Past Medical History:  Diagnosis Date  . Hormone replacement therapy   . Transexualism      Patient Active Problem List   Diagnosis Date Noted  . Depression (emotion) 10/30/2013  . 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 Substance abuse (HCC) 10/10/2013  . Borderline personality disorder (HCC) 10/10/2013    Past Surgical History:  Procedure Laterality Date  . COSMETIC SURGERY     rhinoplasty  . RHINOPLASTY       OB History   None      Home Medications    Prior to Admission medications   Medication Sig Start Date End Date Taking? Authorizing Provider  ciprofloxacin (CIPRO) 500 MG tablet Take 1 tablet (500 mg total) by mouth every 12 (twelve) hours. 09/12/17  Yes Neese, Hope M, NP  ESTRADIOL PO Take 1 tablet by mouth daily.   Yes [provider]  Estrogens Conjugated (PREMARIN PO) Take 1 tablet by mouth daily.   Yes [provider]    Family History No family history on file.  Social History Social History   Tobacco Use  . Smoking status: Former Smoker    Packs/day: 1.00  . Smokeless tobacco: Never Used  Substance Use Topics  . Alcohol use: Yes    Comment: Rarely  . Drug use: Yes    Types: Marijuana     Allergies   Amoxicillin   Review of Systems Review of Systems  All other systems reviewed and are negative.    Physical Exam Updated Vital Signs BP (!) 141/99 (BP Location: Left Arm)   Pulse 79   Temp 97.9 F (36.6 C) (Oral)   Resp 17  Ht 6' (1.829 m)   Wt 93 kg (205 lb)   SpO2 100%   BMI 27.80 kg/m   Physical Exam  Constitutional: She appears well-developed and well-nourished.  HENT:  Head: Normocephalic and atraumatic.  Right Ear: External ear normal.  Left Ear: External ear normal.  Nose: Nose normal.  Mouth/Throat: Uvula is midline, oropharynx is clear and moist and mucous membranes are normal. No tonsillar exudate.  Eyes: Pupils are equal, round, and reactive to light. Right eye exhibits no discharge. Left eye exhibits no discharge. No scleral icterus.  Neck:  Trachea normal. Neck supple. No spinous process tenderness present. No neck rigidity. Normal range of motion present.  Cardiovascular: Normal rate, regular rhythm and intact distal pulses.  No murmur heard. Pulses:      Radial pulses are 2+ on the right side, and 2+ on the left side.       Dorsalis pedis pulses are 2+ on the right side, and 2+ on the left side.       Posterior tibial pulses are 2+ on the right side, and 2+ on the left side.  No lower extremity swelling or edema. Calves symmetric in size bilaterally.  Pulmonary/Chest: Effort normal and breath sounds normal. She exhibits no tenderness.  Abdominal: Soft. Bowel sounds are normal. She exhibits no distension. There is no tenderness. There is no rigidity, no rebound, no guarding and no CVA tenderness.  Genitourinary: Testes normal and penis normal. Right testis shows no mass, no swelling and no tenderness. Left testis shows no mass, no swelling and no tenderness. Circumcised. No phimosis, paraphimosis or hypospadias.  Genitourinary Comments: Chaperone present during genital exam. No external genital lesions noted, no bumps on head of penis, specifically no vesicles concerning for herpes or chancre suggestive of syphillis, no pain with palpation, no discharge or urethritis noted, scrotum and testicles w/o erythema or swelling, NTTP   Musculoskeletal: She exhibits no edema.  Lymphadenopathy:    She has no cervical adenopathy.  Neurological: She is alert.  Skin: Skin is warm and dry. No rash noted. She is not diaphoretic.  Psychiatric: She has a normal mood and affect.  Nursing note and vitals reviewed.    ED Treatments / Results  Labs (all labs ordered are listed, but only abnormal results are displayed) Labs Reviewed  URINALYSIS, ROUTINE W REFLEX MICROSCOPIC - Abnormal; Notable for the following components:      Result Value   Color, Urine STRAW (*)    Hgb urine dipstick SMALL (*)    Bacteria, UA RARE (*)    All other  components within normal limits  CBC WITH DIFFERENTIAL/PLATELET - Abnormal; Notable for the following components:   Platelets 142 (*)    All other components within normal limits  COMPREHENSIVE METABOLIC PANEL - Abnormal; Notable for the following components:   Calcium 8.8 (*)    Alkaline Phosphatase 27 (*)    All other components within normal limits  LIPASE, BLOOD    EKG None  Radiology Ct Renal Stone Study  Result Date: 09/13/2017 CLINICAL DATA:  Right flank pain EXAM: CT ABDOMEN AND PELVIS WITHOUT CONTRAST TECHNIQUE: Multidetector CT imaging of the abdomen and pelvis was performed following the standard protocol without oral or IV contrast. COMPARISON:  Jul 16, 2017 FINDINGS: Lower chest: There is bibasilar atelectatic change. There is no lung base edema or consolidation. Hepatobiliary: No focal liver lesions are evident on this noncontrast enhanced study. Gallbladder wall is not appreciably thickened. There is cholelithiasis. There is no biliary  duct dilatation. Pancreas: There is no appreciable pancreatic mass or inflammatory focus. Spleen: No splenic lesions are evident. Adrenals/Urinary Tract: Adrenals bilaterally appear normal. Kidneys bilaterally show no evident mass or hydronephrosis on either side. There is an extrarenal pelvis on the left, an anatomic variant. There is no evident renal or ureteral calculus on either side. Urinary bladder is midline with wall thickness slightly increased, similar to prior study. Stomach/Bowel: Mild soft tissue stranding remains in the perirectal region with mild rectal wall thickening persisting. No fistula seen in this area. No new bowel wall thickening seen elsewhere. No bowel obstruction. No free air or portal venous air. Vascular/Lymphatic: No abdominal aortic aneurysm. No vascular lesions are evident. No appreciable adenopathy evident in the abdomen or pelvis. Reproductive: Each testis remains located in the respective inguinal ring. Prostate and  seminal vesicles appear normal in size and contour. No evident pelvic mass. Other: Appendix appears unremarkable. No abscess or ascites is evident in the abdomen or pelvis. Musculoskeletal: Extensive subcutaneous stranding and fat reticulation throughout the posterior and lateral aspects of the pelvis again noted. No new abdominal/pelvic wall lesion. No blastic or lytic bone lesions are evident. No intramuscular lesions are appreciable. IMPRESSION: 1. Urinary bladder wall thickening remains. Suspect a degree of cystitis. No renal or ureteral calculus. No hydronephrosis. 2. Mild rectal wall thickening and perirectal stranding remains. Suspect a degree of proctitis. No bowel obstruction. No fistula. No abscess in the abdomen or pelvis. Appendix appears normal. 3. Extensive fatty reticulation throughout the posterolateral aspects of the pelvis, essentially stable. 4.  Each testis located in the respective inguinal ring, stable. 5.  Cholelithiasis present.  No gallbladder wall thickening. Electronically Signed   By: Bretta Bang III M.D.   On: 09/13/2017 13:35    Procedures Procedures (including critical care time)  Medications Ordered in ED Medications  morphine 4 MG/ML injection 4 mg (4 mg Intravenous Given 09/13/17 1232)  ondansetron (ZOFRAN) injection 4 mg (4 mg Intravenous Given 09/13/17 1231)  fentaNYL (SUBLIMAZE) injection 50 mcg (50 mcg Intravenous Given 09/13/17 1418)     Initial Impression / Assessment and Plan / ED Course  I have reviewed the triage vital signs and the nursing notes.  Pertinent labs & imaging results that were available during my care of the patient were reviewed by me and considered in my medical decision making (see chart for details).     32 y.o. adult who is currently transitioning from male to a male with ERT presents emergency department today for right sided flank pain.  Patient reports that he was seen here yesterday for urinary symptoms including burning with  urination, urinary frequency and was diagnosed with a UTI. Urine culture is pending. Patient also reports that he had an episode of burning with ejaculation after intercourse a few days ago.  The patient notes this is not recurrent but made him concerned. The patient reports that they were seen at the health department 2 days ago and treated with azithromycin and are awaiting test results.  He notes he was tested for "everything". Patient reports he had a history of UTI 1 month ago.  Appears on culture report on 5/3 he grew greater than 100,000 enterococcus was sensitive to the ciprofloxacin he was prescribed yesterday (he has grew enterococcus multiple times).  Patient reports they are now having right flank/lower back pain that radiates into the right lower abdomen.  Denies this radiating into the genitalia area.  He denies any penile pain, penile discharge, testicular pain/swelling, ulcers/lesions.  Patient is sexually active with one male partner.  No other sexual partners.  Patient denies history of kidney stones.  No prior abdominal surgeries.  He does he is still having normal bowel movements and passing gas.  He denies any fever, chills, nausea/vomiting/diarrhea, hematuria, dysuria.  Patient is on estrogen replacement therapy but denies any chest pain, shortness of breath, cough, hemoptysis, lower leg swelling or other symptoms to make me concern for PE.  Patient's vital signs are reassuring on presentation.  Is without any fever, tachycardia, tachypnea, hypoxia or hypotension.  Patient's abdominal exam without any tenderness.  No CVA tenderness.  GU exam without any testicular swelling or tenderness.  No rashes or lesions noted.  Patient denies any painful bowel movements.  Urine culture is pending.  Will order CT renal stone study to evaluate as well as basic labs.  Patient without leukocytosis.  There is no anemia.  No significant mitral derangements.  No acute kidney injury.  No evidence of DKA.   No LFT derangements.  Bilirubin within normal limits.  Lipase within normal limits.  There is small hemoglobin in the patient's urine but without evidence of infection, however patient is on ciprofloxacin and has been taking as prescribed.  CT renal stone with urinary bladder wall thickening suggesting cystitis as previously diagnosed yesterday.  There is no renal or ureteral stones.  No hydronephrosis.  There is no evidence of bowel obstruction, fistula or abscess.  No evidence of appendicitis.  There is cholelithiasis that is present but patient is without any right upper quadrant tenderness to palpation as reassuring white blood cell count, LFTs and bilirubin.  Do not feel pain is related to cholecystitis or symptomatic cholelithiasis.  The evaluation does not show pathology that would require ongoing emergent intervention or inpatient treatment. I advised the patient to follow-up with PCP this week.  Patient offered repeat testing of STDs as well as treatment but refused at this time.  States they would like to wait until results come in from health department.  I advised the patient to return to the emergency department with new or worsening symptoms or new concerns. Specific return precautions discussed. The patient verbalized understanding and agreement with plan. All questions answered. No further questions at this time. The patient is hemodynamically stable, mentating appropriately and appears safe for discharge.  Final Clinical Impressions(s) / ED Diagnoses   Final diagnoses:  Flank pain    ED Discharge Orders        Ordered    naproxen (NAPROSYN) 500 MG tablet  2 times daily     09/13/17 1513       Princella PellegriniMaczis, Eberardo M, PA-C 09/14/17 0729    Lorre NickAllen, Anthony, MD 09/14/17 [phone]

## 2017-09-13 NOTE — ED Triage Notes (Signed)
Patient c/o right flank pain that radiates to the right lower abdomen. Patient states they were seen yesterday for the same. Patient reports the pain is not any better. Patient denies any issues with urination.

## 2017-09-13 NOTE — Discharge Instructions (Addendum)
You were seen here today for flank pain and urinary symptoms.  Your lab work and CT scan were reassuring.  Your CT scan showed  1.Urinary bladder wall thickening remains. Suspect a degree of cystitis. No renal or ureteral calculus. No hydronephrosis.  2. Mild rectal wall thickening and perirectal stranding remains. Suspect a degree of proctitis. No bowel obstruction. No fistula. No abscess in the abdomen or pelvis. Appendix appears normal.  You were offered STD testing in the department and you deferred at this time.  Please follow-up with the health department regards to results.  Please continue taking your antibiotics as prescribed for your urinary tract infection.  I would like you to follow-up with your primary care provider in the next 2-3 days for follow-up.  Please take naproxen as needed for pain.   If you develop worsening or new concerning symptoms you can return to the emergency department for re-evaluation.

## 2017-09-29 ENCOUNTER — Encounter (HOSPITAL_COMMUNITY): Payer: Self-pay | Admitting: Emergency Medicine

## 2017-09-29 ENCOUNTER — Ambulatory Visit (HOSPITAL_COMMUNITY)
Admission: EM | Admit: 2017-09-29 | Discharge: 2017-09-29 | Disposition: A | Discharge status: Home / Self Care | Payer: Self-pay | Attending: Internal Medicine | Admitting: Internal Medicine

## 2017-09-29 DIAGNOSIS — F640 Transsexualism: Secondary | ICD-10-CM | POA: Insufficient documentation

## 2017-09-29 DIAGNOSIS — R350 Frequency of micturition: Secondary | ICD-10-CM

## 2017-09-29 DIAGNOSIS — R102 Pelvic and perineal pain: Secondary | ICD-10-CM

## 2017-09-29 DIAGNOSIS — Z880 Allergy status to penicillin: Secondary | ICD-10-CM | POA: Insufficient documentation

## 2017-09-29 DIAGNOSIS — N390 Urinary tract infection, site not specified: Secondary | ICD-10-CM | POA: Insufficient documentation

## 2017-09-29 DIAGNOSIS — Z87891 Personal history of nicotine dependence: Secondary | ICD-10-CM | POA: Insufficient documentation

## 2017-09-29 DIAGNOSIS — Z79890 Hormone replacement therapy (postmenopausal): Secondary | ICD-10-CM | POA: Insufficient documentation

## 2017-09-29 LAB — POCT URINALYSIS DIP (DEVICE)
Bilirubin Urine: NEGATIVE
Glucose, UA: NEGATIVE mg/dL
KETONES UR: NEGATIVE mg/dL
Nitrite: NEGATIVE
PH: 5.5 (ref 5.0–8.0)
PROTEIN: NEGATIVE mg/dL
SPECIFIC GRAVITY, URINE: 1.015 (ref 1.005–1.030)
Urobilinogen, UA: 0.2 mg/dL (ref 0.0–1.0)

## 2017-09-29 MED ORDER — CIPROFLOXACIN HCL 500 MG PO TABS: 500.0000 mg | ORAL_TABLET | Freq: Two times a day (BID) | ORAL | 0 refills | Status: AC

## 2017-09-29 MED ORDER — AZITHROMYCIN 250 MG PO TABS
1000.0000 mg | ORAL_TABLET | Freq: Once | ORAL | Status: AC
  Administered 2017-09-29: 1000 mg via ORAL

## 2017-09-29 MED ORDER — CEFTRIAXONE SODIUM 250 MG IJ SOLR
INTRAMUSCULAR | Status: AC
  Filled 2017-09-29: qty 250

## 2017-09-29 MED ORDER — AZITHROMYCIN 250 MG PO TABS
ORAL_TABLET | ORAL | Status: AC
  Filled 2017-09-29: qty 4

## 2017-09-29 MED ORDER — CEFTRIAXONE SODIUM 250 MG IJ SOLR
250.0000 mg | Freq: Once | INTRAMUSCULAR | Status: AC
  Administered 2017-09-29: 250 mg via INTRAMUSCULAR

## 2017-09-29 NOTE — Discharge Instructions (Signed)
We will treat you today for gonorrhea/chlamydia as testing is pending.  Complete course of Cipro as well which will cover for urinary tract infection.  Drink plenty of water to empty bladder regularly. Avoid alcohol and caffeine as these may irritate the bladder.   Please follow up with PCP and/or Urology for these persistent symptoms.  If develop worsening of pain, fevers, blood in urine or otherwise worsening please return or go to Er.

## 2017-09-29 NOTE — ED Provider Notes (Signed)
[facility]    CSN: [id number] Arrival date & time: 09/29/17  1123     History   Chief Complaint Chief Complaint  Patient presents with  . Urinary Tract Infection    HPI Frederick Hess is a 32 y.o. adult.   Frederick DusterMichelle presents with complaints of of pelvic pressure and back pain which has been ongoing for less than a week. States has had recurrently. Antibiotics have been helpful but then symptoms return. No pain with urination. Urinary frequency. No fevers. No blood in urine. No penile discharge. Patient is transgender, male to male. Engages is penetrative intercourse with a male partner. Denies anal intercourse. Recent HIV screen was negative. Uses condoms. No diarrhea, constipation, nausea or vomiting. Does not have a PCP. Was seen in ED 7/1 and had CT completed with non emergent findings, questioned cystitis and proctitis. Denies penile lesions or sores. On hormone replacement therapy,no other medications.    ROS per HPI.      Past Medical History:  Diagnosis Date  . Hormone replacement therapy   . Transexualism     Patient Active Problem List   Diagnosis Date Noted  . Depression (emotion) 10/30/2013  . 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 Substance abuse (HCC) 10/10/2013  . Borderline personality disorder (HCC) 10/10/2013    Past Surgical History:  Procedure Laterality Date  . COSMETIC SURGERY     rhinoplasty  . RHINOPLASTY      OB History   None      Home Medications    Prior to Admission medications   Medication Sig Start Date End Date Taking? Authorizing Provider  ciprofloxacin (CIPRO) 500 MG tablet Take 1 tablet (500 mg total) by mouth 2 (two) times daily for 7 days. 09/29/17 10/06/17  Linus MakoBurky, Sajid Ruppert B, NP  ESTRADIOL PO Take 1 tablet by mouth daily.    [provider]  Estrogens Conjugated (PREMARIN PO) Take 1 tablet by mouth daily.    [provider]  naproxen (NAPROSYN)  500 MG tablet Take 1 tablet (500 mg total) by mouth 2 (two) times daily. 09/13/17   Maczis, Elmer SowMichael M, PA-C    Family History History reviewed. No pertinent family history.  Social History Social History   Tobacco Use  . Smoking status: Former Smoker    Packs/day: 1.00  . Smokeless tobacco: Never Used  Substance Use Topics  . Alcohol use: Yes    Comment: Rarely  . Drug use: Yes    Types: Marijuana     Allergies   Amoxicillin   Review of Systems Review of Systems   Physical Exam Triage Vital Signs ED Triage Vitals [09/29/17 1145]  Enc Vitals Group     BP (!) 131/106     Pulse Rate 73     Resp 18     Temp 98.1 F (36.7 C)     Temp Source Oral     SpO2 99 %     Weight      Height      Head Circumference      Peak Flow      Pain Score      Pain Loc      Pain Edu?      Excl. in GC?    No data found.  Updated Vital Signs BP (!) 131/106 (BP Location: Left Arm)   Pulse 73   Temp 98.1 F (36.7 C) (Oral)   Resp 18   SpO2 99%  Physical Exam  Constitutional: She is oriented to person, place, and time. She appears well-developed and well-nourished. No distress.  Cardiovascular: Normal rate, regular rhythm and normal heart sounds.  Pulmonary/Chest: Effort normal and breath sounds normal.  Abdominal: Soft. Bowel sounds are normal. There is no rigidity, no rebound, no guarding, no CVA tenderness, no tenderness at McBurney's point and negative Murphy's sign.  Pelvic "pressure" on palpation   Neurological: She is alert and oriented to person, place, and time.  Skin: Skin is warm and dry.     UC Treatments / Results  Labs (all labs ordered are listed, but only abnormal results are displayed) Labs Reviewed  POCT URINALYSIS DIP (DEVICE) - Abnormal; Notable for the following components:      Result Value   Hgb urine dipstick TRACE (*)    Leukocytes, UA SMALL (*)    All other components within normal limits  URINE CULTURE  URINE CYTOLOGY ANCILLARY ONLY     EKG None  Radiology No results found.  Procedures Procedures (including critical care time)  Medications Ordered in UC Medications  cefTRIAXone (ROCEPHIN) injection 250 mg (has no administration in time range)  azithromycin (ZITHROMAX) tablet 1,000 mg (has no administration in time range)    Initial Impression / Assessment and Plan / UC Course  I have reviewed the triage vital signs and the nursing notes.  Pertinent labs & imaging results that were available during my care of the patient were reviewed by me and considered in my medical decision making (see chart for details).     Afebrile, non toxic in appearance. leuks and hgb in urine. Cystitis vs urethritis discussed with patient. Cytology pending. Urine culture pending. Empiric azithromycin and rocephin provided, course of cipro provided. Encouraged follow up with PCP and/or Urology for recurrent symptoms. Patient verbalized understanding and agreeable to plan.    Final Clinical Impressions(s) / UC Diagnoses   Final diagnoses:  Lower urinary tract infectious disease     Discharge Instructions     We will treat you today for gonorrhea/chlamydia as testing is pending.  Complete course of Cipro as well which will cover for urinary tract infection.  Drink plenty of water to empty bladder regularly. Avoid alcohol and caffeine as these may irritate the bladder.   Please follow up with PCP and/or Urology for these persistent symptoms.  If develop worsening of pain, fevers, blood in urine or otherwise worsening please return or go to Er.    ED Prescriptions    Medication Sig Dispense Auth. Provider   ciprofloxacin (CIPRO) 500 MG tablet Take 1 tablet (500 mg total) by mouth 2 (two) times daily for 7 days. 14 tablet Georgetta Haber, NP     Controlled Substance Prescriptions [place] Controlled Substance Registry consulted? Not Applicable   Georgetta Haber, NP 09/29/17 1304

## 2017-09-29 NOTE — ED Triage Notes (Signed)
Pt here for UTI sx and pain in abd area and lower back; pt sts hx of same in past

## 2017-09-30 LAB — URINE CULTURE: CULTURE: NO GROWTH

## 2017-10-01 LAB — URINE CYTOLOGY ANCILLARY ONLY
CHLAMYDIA, DNA PROBE: NEGATIVE
Neisseria Gonorrhea: NEGATIVE
TRICH (WINDOWPATH): POSITIVE — AB

## 2017-10-03 ENCOUNTER — Telehealth (HOSPITAL_COMMUNITY): Payer: Self-pay

## 2017-10-03 MED ORDER — METRONIDAZOLE 500 MG PO TABS: 2000.0000 mg | ORAL_TABLET | Freq: Once | ORAL | 0 refills | Status: AC

## 2017-10-03 NOTE — Telephone Encounter (Signed)
Trichomonas is positive. Rx metronidazole 2000 mg once was sent to the pharmacy of record. PT called and made aware.  Educated patient to refrain from sexual intercourse for 7 days to give the medicine time to work. Sexual partners need to be notified and tested/treated. Condoms may reduce risk of reinfection.  Recheck for further evaluation if symptoms are not improving. Pt verbalized understanding.

## 2018-09-21 ENCOUNTER — Encounter (HOSPITAL_COMMUNITY): Payer: Self-pay

## 2018-09-21 ENCOUNTER — Other Ambulatory Visit: Payer: Self-pay

## 2018-09-21 ENCOUNTER — Emergency Department (HOSPITAL_COMMUNITY)
Admission: EM | Admit: 2018-09-21 | Discharge: 2018-09-21 | Disposition: A | Discharge status: Home / Self Care | Payer: Self-pay | Attending: Emergency Medicine | Admitting: Emergency Medicine

## 2018-09-21 DIAGNOSIS — Z87891 Personal history of nicotine dependence: Secondary | ICD-10-CM | POA: Insufficient documentation

## 2018-09-21 DIAGNOSIS — R1030 Lower abdominal pain, unspecified: Secondary | ICD-10-CM

## 2018-09-21 DIAGNOSIS — R1031 Right lower quadrant pain: Secondary | ICD-10-CM | POA: Insufficient documentation

## 2018-09-21 DIAGNOSIS — R1032 Left lower quadrant pain: Secondary | ICD-10-CM | POA: Insufficient documentation

## 2018-09-21 LAB — URINALYSIS, ROUTINE W REFLEX MICROSCOPIC
Bilirubin Urine: NEGATIVE
Glucose, UA: NEGATIVE mg/dL
Ketones, ur: NEGATIVE mg/dL
Nitrite: NEGATIVE
Protein, ur: NEGATIVE mg/dL
Specific Gravity, Urine: 1.016 (ref 1.005–1.030)
pH: 5 (ref 5.0–8.0)

## 2018-09-21 MED ORDER — CEFTRIAXONE SODIUM 250 MG IJ SOLR
250.0000 mg | Freq: Once | INTRAMUSCULAR | Status: AC
  Administered 2018-09-21: 250 mg via INTRAMUSCULAR
  Filled 2018-09-21: qty 250

## 2018-09-21 MED ORDER — AZITHROMYCIN 250 MG PO TABS
1000.0000 mg | ORAL_TABLET | Freq: Once | ORAL | Status: AC
  Administered 2018-09-21: 1000 mg via ORAL
  Filled 2018-09-21: qty 4

## 2018-09-21 MED ORDER — METRONIDAZOLE 500 MG PO TABS
2000.0000 mg | ORAL_TABLET | Freq: Once | ORAL | Status: AC
Start: 2018-09-21 — End: 2018-09-21
  Administered 2018-09-21: 07:00:00 2000 mg via ORAL
  Filled 2018-09-21: qty 4

## 2018-09-21 MED ORDER — LIDOCAINE HCL 2 % IJ SOLN
INTRAMUSCULAR | Status: AC
  Administered 2018-09-21: 20 mg
  Filled 2018-09-21: qty 20

## 2018-09-21 MED ORDER — CEPHALEXIN 500 MG PO CAPS: 500.0000 mg | ORAL_CAPSULE | Freq: Two times a day (BID) | ORAL | 0 refills | Status: DC

## 2018-09-21 NOTE — ED Notes (Signed)
Bed: [facility] Expected date:  Expected time:  Means of arrival:  Comments: 

## 2018-09-21 NOTE — Discharge Instructions (Addendum)
°  All the results in the ER are normal, besides signs of bacteria in your urine. We are not sure what is causing your symptoms. The workup in the ER is not complete, and is limited to screening for life threatening and emergent conditions only, so please see a primary care doctor for further evaluation.  Please return to the ER if your symptoms worsen; you have increased pain, fevers, chills, inability to keep any medications down, confusion. Otherwise see the outpatient doctor as requested.

## 2018-09-21 NOTE — ED Triage Notes (Signed)
Pt complains of left flank pain that radiates to her back, she states that it hurts worse in the morning and when she has to urinate

## 2018-09-21 NOTE — ED Provider Notes (Addendum)
[facility]-EMERGENCY DEPT Provider Note   CSN: [id number] Arrival date & time: 09/21/18  0012    History   Chief Complaint Chief Complaint  Patient presents with  . Flank Pain    HPI Frederick Hess is a 33 y.o. adult.     HPI  33 year old transgender male to male patient who is on hormone replaced therapy but still does not have male genitalia comes in a chief complaint of flank pain.  Patient reports that she has been having lower quadrant abdominal pain for the last 1 month.  The pain is intermittent, with waxing and waning intensity.  Now the pain is fairly persistent and got worse tonight therefore she wanted to get checked out.  She denies any UTI-like symptoms, discharge, diarrhea.  She has had STDs in the last 2 or 3 months, but states that at that time she was having dysuria.  No history of abdominal surgeries.  Past Medical History:  Diagnosis Date  . Hormone replacement therapy   . Transexualism     Patient Active Problem List   Diagnosis Date Noted  . Depression (emotion) 10/30/2013  . 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 Substance abuse (HCC) 10/10/2013  . Borderline personality disorder (HCC) 10/10/2013    Past Surgical History:  Procedure Laterality Date  . COSMETIC SURGERY     rhinoplasty  . RHINOPLASTY       OB History   No obstetric history on file.      Home Medications    Prior to Admission medications   Medication Sig Start Date End Date Taking? Authorizing Provider  naproxen (NAPROSYN) 500 MG tablet Take 1 tablet (500 mg total) by mouth 2 (two) times daily. Patient not taking: Reported on 09/21/2018 09/13/17   Maczis, Elmer SowMichael M, PA-C    Family History History reviewed. No pertinent family history.  Social History Social History   Tobacco Use  . Smoking status: Former Smoker    Packs/day: 1.00  . Smokeless tobacco: Never Used  Substance Use Topics  .  Alcohol use: Yes    Comment: Rarely  . Drug use: Yes    Types: Marijuana     Allergies   Amoxicillin   Review of Systems Review of Systems  Constitutional: Positive for activity change.  Respiratory: Negative for shortness of breath.   Cardiovascular: Negative for chest pain.  Gastrointestinal: Positive for abdominal pain. Negative for nausea and vomiting.  Genitourinary: Positive for flank pain. Negative for dysuria.  All other systems reviewed and are negative.    Physical Exam Updated Vital Signs BP 121/72 (BP Location: Right Arm)   Pulse 62   Temp 97.8 F (36.6 C) (Oral)   Resp 17   Ht 6' (1.829 m)   Wt 93 kg   SpO2 100%   BMI 27.80 kg/m   Physical Exam Vitals signs and nursing note reviewed.  Constitutional:      Appearance: She is well-developed.  HENT:     Head: Normocephalic and atraumatic.  Eyes:     Pupils: Pupils are equal, round, and reactive to light.  Neck:     Musculoskeletal: Neck supple.  Cardiovascular:     Rate and Rhythm: Normal rate and regular rhythm.     Heart sounds: Normal heart sounds.  Pulmonary:     Effort: Pulmonary effort is normal. No respiratory distress.     Breath sounds: No wheezing.  Abdominal:     General: There is no  distension.     Palpations: Abdomen is soft.     Tenderness: There is abdominal tenderness. There is no guarding or rebound.     Comments: Tenderness in the lower quadrants diffusely.   Skin:    General: Skin is warm and dry.  Neurological:     Mental Status: She is alert and oriented to person, place, and time.      ED Treatments / Results  Labs (all labs ordered are listed, but only abnormal results are displayed) Labs Reviewed  URINALYSIS, ROUTINE W REFLEX MICROSCOPIC - Abnormal; Notable for the following components:      Result Value   Hgb urine dipstick SMALL (*)    Leukocytes,Ua LARGE (*)    Bacteria, UA RARE (*)    All other components within normal limits  GC/CHLAMYDIA PROBE AMP ([facility]) NOT AT [facility]    EKG None  Radiology No results found.  Procedures Procedures (including critical care time)  Medications Ordered in ED Medications  metroNIDAZOLE (FLAGYL) tablet 2,000 mg (has no administration in time range)  azithromycin (ZITHROMAX) tablet 1,000 mg (has no administration in time range)  cefTRIAXone (ROCEPHIN) injection 250 mg (has no administration in time range)     Initial Impression / Assessment and Plan / ED Course  I have reviewed the triage vital signs and the nursing notes.  Pertinent labs & imaging results that were available during my care of the patient were reviewed by me and considered in my medical decision making (see chart for details).        33 year old transgendered male to male patient comes in a chief complaint of abdominal pain.  She has had UTI and STDs in the past, however at this time there is no dysuria or hematuria.  Abdominal exam is not peritoneal, but there is generalized lower quadrant tenderness.  Plan is to get the UA along with GC and chlamydia on the urine test.  If the UA looks reassuring then we will actually get blood work and reassess the patient for CT scan.  Given that patient is on hormone, thrombosis would be in the differential diagnosis.  Given that the pain has been present for 1 month, acute cause due to surgical emergency or infection is less likely.  6:44 AM UA is positive for 21-50 bacteria and large leukocytes. We will discharge with Keflex. Patient has agreed to getting coverage for [facility] and chlamydia.  Final Clinical Impressions(s) / ED Diagnoses   Final diagnoses:  Lower abdominal pain    ED Discharge Orders    None       Varney Biles, MD 09/21/18 [place], [place], MD 09/21/18 [phone]

## 2018-10-11 ENCOUNTER — Other Ambulatory Visit: Payer: Self-pay

## 2018-10-11 ENCOUNTER — Ambulatory Visit (HOSPITAL_COMMUNITY)
Admission: EM | Admit: 2018-10-11 | Discharge: 2018-10-11 | Disposition: A | Discharge status: Home / Self Care | Payer: Self-pay | Attending: Family Medicine | Admitting: Family Medicine

## 2018-10-11 ENCOUNTER — Encounter (HOSPITAL_COMMUNITY): Payer: Self-pay | Admitting: Emergency Medicine

## 2018-10-11 DIAGNOSIS — R1030 Lower abdominal pain, unspecified: Secondary | ICD-10-CM | POA: Insufficient documentation

## 2018-10-11 LAB — POCT URINALYSIS DIP (DEVICE)
Bilirubin Urine: NEGATIVE
Glucose, UA: NEGATIVE mg/dL
Hgb urine dipstick: NEGATIVE
Ketones, ur: NEGATIVE mg/dL
Leukocytes,Ua: NEGATIVE
Nitrite: NEGATIVE
Protein, ur: NEGATIVE mg/dL
Specific Gravity, Urine: 1.025 (ref 1.005–1.030)
Urobilinogen, UA: 0.2 mg/dL (ref 0.0–1.0)
pH: 6.5 (ref 5.0–8.0)

## 2018-10-11 NOTE — ED Provider Notes (Signed)
[facility]    CSN: [id number] Arrival date & time: 10/11/18  1320     History   Chief Complaint Chief Complaint  Patient presents with  . Urinary Tract Infection    HPI Frederick Hess is a 33 y.o. adult.   Patient presents with 2-week history of lower abdominal pain and dysuria.  Seen in the emergency department on 09/21/2018 and treated with ceftriaxone, azithromycin, metronidazole; sent home with course of Keflex for a UTI.  History of trichomonas in 2019.  Patient is sexually active with one partner and reports consistent condom use.  Denies fever, chills, vomiting, diarrhea.    The history is provided by the patient.    Past Medical History:  Diagnosis Date  . Hormone replacement therapy   . Transexualism     Patient Active Problem List   Diagnosis Date Noted  . Depression (emotion) 10/30/2013  . 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 Substance abuse ([place]) 10/10/2013  . Borderline personality disorder ([place]) 10/10/2013    Past Surgical History:  Procedure Laterality Date  . COSMETIC SURGERY     rhinoplasty  . RHINOPLASTY      OB History   No obstetric history on file.      Home Medications    Prior to Admission medications   Medication Sig Start Date End Date Taking? Authorizing Provider  cephALEXin (KEFLEX) 500 MG capsule Take 1 capsule (500 mg total) by mouth 2 (two) times daily. 09/21/18   Varney Biles, MD  naproxen (NAPROSYN) 500 MG tablet Take 1 tablet (500 mg total) by mouth 2 (two) times daily. Patient not taking: Reported on 09/21/2018 09/13/17   Maczis, Barth Kirks, PA-C    Family History History reviewed. No pertinent family history.  Social History Social History   Tobacco Use  . Smoking status: Former Smoker    Packs/day: 1.00  . Smokeless tobacco: Never Used  Substance Use Topics  . Alcohol use: Yes    Comment: Rarely  . Drug use: Yes    Types: Marijuana     Allergies    Amoxicillin   Review of Systems Review of Systems  Constitutional: Negative for chills and fever.  HENT: Negative for ear pain and sore throat.   Eyes: Negative for pain and visual disturbance.  Respiratory: Negative for cough and shortness of breath.   Cardiovascular: Negative for chest pain and palpitations.  Gastrointestinal: Positive for abdominal pain. Negative for diarrhea and vomiting.  Genitourinary: Positive for dysuria. Negative for hematuria.  Musculoskeletal: Negative for arthralgias and back pain.  Skin: Negative for color change and rash.  Neurological: Negative for seizures and syncope.  All other systems reviewed and are negative.    Physical Exam Triage Vital Signs ED Triage Vitals  Enc Vitals Group     BP 10/11/18 1348 120/73     Pulse Rate 10/11/18 1348 72     Resp 10/11/18 1348 16     Temp 10/11/18 1348 97.9 F (36.6 C)     Temp Source 10/11/18 1348 Oral     SpO2 10/11/18 1348 100 %     Weight --      Height --      Head Circumference --      Peak Flow --      Pain Score 10/11/18 1417 0     Pain Loc --      Pain Edu? --      Excl. in [place]? --  No data found.  Updated Vital Signs BP 120/73 (BP Location: Left Arm)   Pulse 72   Temp 97.9 F (36.6 C) (Oral)   Resp 16   SpO2 100%   Visual Acuity Right Eye Distance:   Left Eye Distance:   Bilateral Distance:    Right Eye Near:   Left Eye Near:    Bilateral Near:     Physical Exam Vitals signs and nursing note reviewed.  Constitutional:      Appearance: She is well-developed.  HENT:     Head: Normocephalic and atraumatic.     Mouth/Throat:     Mouth: Mucous membranes are moist.     Pharynx: Oropharynx is clear.  Eyes:     Conjunctiva/sclera: Conjunctivae normal.  Neck:     Musculoskeletal: Neck supple.  Cardiovascular:     Rate and Rhythm: Normal rate and regular rhythm.     Heart sounds: No murmur.  Pulmonary:     Effort: Pulmonary effort is normal. No respiratory distress.      Breath sounds: Normal breath sounds.  Abdominal:     Palpations: Abdomen is soft.     Tenderness: There is no abdominal tenderness. There is no right CVA tenderness, left CVA tenderness, guarding or rebound.  Skin:    General: Skin is warm and dry.  Neurological:     Mental Status: She is alert.      UC Treatments / Results  Labs (all labs ordered are listed, but only abnormal results are displayed) Labs Reviewed  POCT URINALYSIS DIP (DEVICE)  URINE CYTOLOGY ANCILLARY ONLY    EKG   Radiology No results found.  Procedures Procedures (including critical care time)  Medications Ordered in UC Medications - No data to display  Initial Impression / Assessment and Plan / UC Course  I have reviewed the triage vital signs and the nursing notes.  Pertinent labs & imaging results that were available during my care of the patient were reviewed by me and considered in my medical decision making (see chart for details).   Lower abdominal pain.  Urine normal.  Urine cytology for gonorrhea, chlamydia, trichomonas pending but patient received treatment for these on 09/21/2018.  Instructed patient not to have sex until test results are back and that if the tests come back positive, all sexual partners will need to be treated.  Instructed patient to go to the emergency department if abdominal pain worsens; or for fever, chills, vomiting, diarrhea, or other concerning symptoms.     Final Clinical Impressions(s) / UC Diagnoses   Final diagnoses:  Lower abdominal pain     Discharge Instructions     Your urine appears normal today.    Your STD tests are pending.  You were treated for these in the emergency department on 09/21/2018 so we will wait for additional treatment until the test results come back.  Do not have sex until the test results come back.  If your test results are positive, your sexual partner will also need to be treated.    Go to the emergency department if your abdominal  pain worsens; or if you develop fever, chills, vomiting, diarrhea, or other concerning symptoms.        ED Prescriptions    None     Controlled Substance Prescriptions [place] Controlled Substance Registry consulted? Not Applicable   Mickie Bailate, Jewels Langone H, NP 10/11/18 1530

## 2018-10-11 NOTE — ED Triage Notes (Signed)
Seen at hospital for similar symptoms around 7/8  Patient complains of lower abdominal pain and feels 'strange " when urinating

## 2018-10-11 NOTE — Discharge Instructions (Addendum)
Your urine appears normal today.    Your STD tests are pending.  You were treated for these in the emergency department on 09/21/2018 so we will wait for additional treatment until the test results come back.  Do not have sex until the test results come back.  If your test results are positive, your sexual partner will also need to be treated.    Go to the emergency department if your abdominal pain worsens; or if you develop fever, chills, vomiting, diarrhea, or other concerning symptoms.

## 2018-10-12 LAB — URINE CYTOLOGY ANCILLARY ONLY
Chlamydia: NEGATIVE
Neisseria Gonorrhea: NEGATIVE
Trichomonas: NEGATIVE

## 2018-10-31 ENCOUNTER — Other Ambulatory Visit: Payer: Self-pay

## 2018-10-31 ENCOUNTER — Emergency Department (HOSPITAL_COMMUNITY): Payer: Self-pay

## 2018-10-31 ENCOUNTER — Encounter (HOSPITAL_COMMUNITY): Payer: Self-pay | Admitting: Emergency Medicine

## 2018-10-31 ENCOUNTER — Emergency Department (HOSPITAL_COMMUNITY)
Admission: EM | Admit: 2018-10-31 | Discharge: 2018-10-31 | Disposition: A | Discharge status: Home / Self Care | Payer: Self-pay | Attending: Emergency Medicine | Admitting: Emergency Medicine

## 2018-10-31 DIAGNOSIS — M545 Low back pain, unspecified: Secondary | ICD-10-CM

## 2018-10-31 DIAGNOSIS — R1032 Left lower quadrant pain: Secondary | ICD-10-CM | POA: Insufficient documentation

## 2018-10-31 DIAGNOSIS — Z87891 Personal history of nicotine dependence: Secondary | ICD-10-CM | POA: Insufficient documentation

## 2018-10-31 LAB — COMPREHENSIVE METABOLIC PANEL
ALT: 13 U/L (ref 0–44)
AST: 19 U/L (ref 15–41)
Albumin: 4.4 g/dL (ref 3.5–5.0)
Alkaline Phosphatase: 29 U/L — ABNORMAL LOW (ref 38–126)
Anion gap: 8 (ref 5–15)
BUN: 8 mg/dL (ref 6–20)
CO2: 28 mmol/L (ref 22–32)
Calcium: 9.2 mg/dL (ref 8.9–10.3)
Chloride: 104 mmol/L (ref 98–111)
Creatinine, Ser: 1.05 mg/dL (ref 0.61–1.24)
GFR calc Af Amer: >60 mL/min (ref >60)
GFR calc non Af Amer: >60 mL/min (ref >60)
Glucose, Bld: 88 mg/dL (ref 70–99)
Potassium: 4.1 mmol/L (ref 3.5–5.1)
Sodium: 140 mmol/L (ref 135–145)
Total Bilirubin: 1.1 mg/dL (ref 0.3–1.2)
Total Protein: 8.2 g/dL — ABNORMAL HIGH (ref 6.5–8.1)

## 2018-10-31 LAB — URINALYSIS, ROUTINE W REFLEX MICROSCOPIC
Bilirubin Urine: NEGATIVE
Glucose, UA: NEGATIVE mg/dL
Hgb urine dipstick: NEGATIVE
Ketones, ur: NEGATIVE mg/dL
Leukocytes,Ua: NEGATIVE
Nitrite: NEGATIVE
Protein, ur: NEGATIVE mg/dL
Specific Gravity, Urine: 1.012 (ref 1.005–1.030)
pH: 6 (ref 5.0–8.0)

## 2018-10-31 LAB — CBC WITH DIFFERENTIAL/PLATELET
Abs Immature Granulocytes: 0 10*3/uL (ref 0.00–0.07)
Basophils Absolute: 0 10*3/uL (ref 0.0–0.1)
Basophils Relative: 0 %
Eosinophils Absolute: 0 10*3/uL (ref 0.0–0.5)
Eosinophils Relative: 1 %
HCT: 43 % (ref 39.0–52.0)
Hemoglobin: 14.4 g/dL (ref 13.0–17.0)
Immature Granulocytes: 0 %
Lymphocytes Relative: 36 %
Lymphs Abs: 1.6 10*3/uL (ref 0.7–4.0)
MCH: 29.3 pg (ref 26.0–34.0)
MCHC: 33.5 g/dL (ref 30.0–36.0)
MCV: 87.4 fL (ref 80.0–100.0)
Monocytes Absolute: 0.4 10*3/uL (ref 0.1–1.0)
Monocytes Relative: 9 %
Neutro Abs: 2.5 10*3/uL (ref 1.7–7.7)
Neutrophils Relative %: 54 %
Platelets: 148 10*3/uL — ABNORMAL LOW (ref 150–400)
RBC: 4.92 MIL/uL (ref 4.22–5.81)
RDW: 13.6 % (ref 11.5–15.5)
WBC: 4.6 10*3/uL (ref 4.0–10.5)
nRBC: 0 % (ref 0.0–0.2)

## 2018-10-31 LAB — LIPASE, BLOOD: Lipase: 23 U/L (ref 11–51)

## 2018-10-31 MED ORDER — SODIUM CHLORIDE 0.9 % IV BOLUS
500.0000 mL | Freq: Once | INTRAVENOUS | Status: AC
  Administered 2018-10-31: 500 mL via INTRAVENOUS

## 2018-10-31 MED ORDER — SODIUM CHLORIDE (PF) 0.9 % IJ SOLN
INTRAMUSCULAR | Status: AC
  Filled 2018-10-31: qty 50

## 2018-10-31 MED ORDER — METHOCARBAMOL 500 MG PO TABS: 500.0000 mg | ORAL_TABLET | Freq: Two times a day (BID) | ORAL | 0 refills | Status: DC

## 2018-10-31 MED ORDER — IOHEXOL 300 MG/ML  SOLN
100.0000 mL | Freq: Once | INTRAMUSCULAR | Status: AC | PRN
  Administered 2018-10-31: 100 mL via INTRAVENOUS

## 2018-10-31 MED ORDER — IBUPROFEN 600 MG PO TABS: 600.0000 mg | ORAL_TABLET | Freq: Four times a day (QID) | ORAL | 0 refills | Status: DC | PRN

## 2018-10-31 NOTE — ED Triage Notes (Signed)
Pt complaint of bilateral flank and lower back pain for 3-4 weeks; "trouble peeing at times."

## 2018-10-31 NOTE — ED Notes (Signed)
An After Visit Summary was printed and given to the patient. Discharge instructions given and no further questions at this time.  

## 2018-10-31 NOTE — Discharge Instructions (Signed)
Take ibuprofen every 6 hours as prescribed.  Take Robaxin twice daily as needed for muscle pain or spasms.  Do not drive or operate machinery while taking Robaxin.  Use ice and heat alternating 20 minutes on, 20 minutes off.  After heating, attempt the exercises and stretches and ice following.  Please follow-up with your doctor if your symptoms are not improving.  Please return to the emergency department you develop any new or worsening symptoms including complete numbness of your groin or legs or loss of bowel or bladder control.

## 2018-10-31 NOTE — ED Provider Notes (Signed)
[facility]-EMERGENCY DEPT Provider Note   CSN: [id number] Arrival date & time: 10/31/18  [id number]    History   Chief Complaint Chief Complaint  Patient presents with  . Flank Pain    HPI Frederick Hess is a 33 y.o. adult transgender male to male who is on hormone replacement who presents with a couple month history of left flank and left lower quadrant pain.  She has had some urinary frequency.  He denies any fever, chest pain, shortness of breath, nausea, vomiting, discharge, hematuria.  No medications taken at home for symptoms.  Patient has been evaluated twice since July 8 for similar symptoms, however no blood work or imaging was completed at that time.  Patient denies any saddle anesthesia, bowel or bladder incontinence, history of cancer or IVDU or procedure to back.     HPI  Past Medical History:  Diagnosis Date  . Hormone replacement therapy   . Transexualism     Patient Active Problem List   Diagnosis Date Noted  . Depression (emotion) 10/30/2013  . 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 Substance abuse (HCC) 10/10/2013  . Borderline personality disorder (HCC) 10/10/2013    Past Surgical History:  Procedure Laterality Date  . COSMETIC SURGERY     rhinoplasty  . RHINOPLASTY       OB History   No obstetric history on file.      Home Medications    Prior to Admission medications   Medication Sig Start Date End Date Taking? Authorizing Provider  acetaminophen (TYLENOL) 325 MG tablet Take 650 mg by mouth every 6 (six) hours as needed for mild pain, moderate pain or headache.   Yes [provider]  ibuprofen (ADVIL) 600 MG tablet Take 1 tablet (600 mg total) by mouth every 6 (six) hours as needed. 10/31/18   Simar Pothier, Waylan BogaAlexandra M, PA-C  methocarbamol (ROBAXIN) 500 MG tablet Take 1 tablet (500 mg total) by mouth 2 (two) times daily. 10/31/18   Emi HolesLaw, Matthews Franks M, PA-C    Family History No  family history on file.  Social History Social History   Tobacco Use  . Smoking status: Former Smoker    Packs/day: 1.00  . Smokeless tobacco: Never Used  Substance Use Topics  . Alcohol use: Yes    Comment: Rarely  . Drug use: Yes    Types: Marijuana     Allergies   Amoxicillin   Review of Systems Review of Systems  Constitutional: Negative for chills and fever.  HENT: Negative for facial swelling and sore throat.   Respiratory: Negative for shortness of breath.   Cardiovascular: Negative for chest pain.  Gastrointestinal: Positive for abdominal pain. Negative for nausea and vomiting.  Genitourinary: Positive for flank pain and frequency. Negative for dysuria.  Musculoskeletal: Positive for back pain.  Skin: Negative for rash and wound.  Neurological: Negative for headaches.  Psychiatric/Behavioral: The patient is not nervous/anxious.      Physical Exam Updated Vital Signs BP (!) 121/94   Pulse 60   Temp 98 F (36.7 C) (Oral)   Resp 18   Ht 6' (1.829 m)   Wt 93 kg   SpO2 99%   BMI 27.80 kg/m   Physical Exam Vitals signs and nursing note reviewed.  Constitutional:      General: She is not in acute distress.    Appearance: She is well-developed. She is not diaphoretic.  HENT:     Head: Normocephalic and atraumatic.  Mouth/Throat:     Pharynx: No oropharyngeal exudate.  Eyes:     General: No scleral icterus.       Right eye: No discharge.        Left eye: No discharge.     Conjunctiva/sclera: Conjunctivae normal.     Pupils: Pupils are equal, round, and reactive to light.  Neck:     Musculoskeletal: Normal range of motion and neck supple.     Thyroid: No thyromegaly.  Cardiovascular:     Rate and Rhythm: Normal rate and regular rhythm.     Heart sounds: Normal heart sounds. No murmur. No friction rub. No gallop.   Pulmonary:     Effort: Pulmonary effort is normal. No respiratory distress.     Breath sounds: Normal breath sounds. No stridor. No  wheezing or rales.  Abdominal:     General: Bowel sounds are normal. There is no distension.     Palpations: Abdomen is soft.     Tenderness: There is abdominal tenderness in the left lower quadrant. There is left CVA tenderness. There is no guarding or rebound.    Musculoskeletal:     Comments: Tenderness to the left flank  Lymphadenopathy:     Cervical: No cervical adenopathy.  Skin:    General: Skin is warm and dry.     Coloration: Skin is not pale.     Findings: No rash.  Neurological:     Mental Status: She is alert.     Coordination: Coordination normal.     Comments: 5/5 strength to bilateral lower extremities      ED Treatments / Results  Labs (all labs ordered are listed, but only abnormal results are displayed) Labs Reviewed  COMPREHENSIVE METABOLIC PANEL - Abnormal; Notable for the following components:      Result Value   Total Protein 8.2 (*)    Alkaline Phosphatase 29 (*)    All other components within normal limits  CBC WITH DIFFERENTIAL/PLATELET - Abnormal; Notable for the following components:   Platelets 148 (*)    All other components within normal limits  URINALYSIS, ROUTINE W REFLEX MICROSCOPIC  LIPASE, BLOOD    EKG None  Radiology Ct Abdomen Pelvis W Contrast  Result Date: 10/31/2018 CLINICAL DATA:  Acute left lower quadrant and left flank pain. EXAM: CT ABDOMEN AND PELVIS WITH CONTRAST TECHNIQUE: Multidetector CT imaging of the abdomen and pelvis was performed using the standard protocol following bolus administration of intravenous contrast. CONTRAST:  100mL OMNIPAQUE IOHEXOL 300 MG/ML  SOLN COMPARISON:  09/13/2017 CT abdomen/pelvis. FINDINGS: Lower chest: No significant pulmonary nodules or acute consolidative airspace disease. Hepatobiliary: Normal liver size. No liver mass. Tiny faintly calcified gallstones in the otherwise normal gallbladder with no gallbladder wall thickening or pericholecystic fluid. No biliary ductal dilatation. Pancreas:  Normal, with no mass or duct dilation. Spleen: Normal size. No mass. Adrenals/Urinary Tract: Normal adrenals. No hydronephrosis. No contour deforming renal masses. Small foci of renal cortical scarring in the upper left kidney and anterior lower right kidney. Normal bladder. Stomach/Bowel: Normal non-distended stomach. Normal caliber small bowel with no small bowel wall thickening. Normal appendix. Normal large bowel with no diverticulosis, large bowel wall thickening or pericolonic fat stranding. Vascular/Lymphatic: Normal caliber abdominal aorta. Patent splenic and renal veins. No pathologically enlarged lymph nodes in the abdomen or pelvis. Reproductive: Normal size prostate. Testes are symmetric and normal in appearance and are located in the inguinal canals bilaterally. Other: No pneumoperitoneum, ascites or focal fluid collection. Musculoskeletal: No aggressive  appearing focal osseous lesions. Relatively symmetric nonspecific extensive patchy nodular subcutaneous fat stranding in the gluteal regions bilaterally. IMPRESSION: 1. No acute abnormality. No evidence of bowel obstruction or acute bowel inflammation. Normal appendix. No hydronephrosis. 2. Cholelithiasis, with no evidence of acute cholecystitis. 3. Extensive patchy nodular subcutaneous fat stranding in the gluteal regions bilaterally, nonspecific, correlate for history of cosmetic procedure. No discrete fluid collections. Electronically Signed   By: Ilona Sorrel M.D.   On: 10/31/2018 14:55    Procedures Procedures (including critical care time)  Medications Ordered in ED Medications  sodium chloride (PF) 0.9 % injection (has no administration in time range)  sodium chloride 0.9 % bolus 500 mL (0 mLs Intravenous Stopped 10/31/18 1447)  iohexol (OMNIPAQUE) 300 MG/ML solution 100 mL (100 mLs Intravenous Contrast Given 10/31/18 1418)     Initial Impression / Assessment and Plan / ED Course  I have reviewed the triage vital signs and the  nursing notes.  Pertinent labs & imaging results that were available during my care of the patient were reviewed by me and considered in my medical decision making (see chart for details).        Patient presenting with left flank and left lower quadrant pain.  It is intermittent and waxing and waning.  Labs are unremarkable.  CT abdomen pelvis shows no acute findings or cause for the patient's pain.  On further discussion, patient has had some intermittent numbness and tingling to bilateral legs, worse in the morning.  Musculoskeletal etiology likely.  Will treat with NSAIDs, Robaxin, ice, heat, stretching/exercise.  Follow-up to PCP if symptoms not improving.  Return precautions discussed.  Patient understands and agrees to plan.  Patient vitals stable throughout ED course and discharged in satisfactory condition.  Final Clinical Impressions(s) / ED Diagnoses   Final diagnoses:  Acute left-sided low back pain, unspecified whether sciatica present    ED Discharge Orders         Ordered    ibuprofen (ADVIL) 600 MG tablet  Every 6 hours PRN     10/31/18 1519    methocarbamol (ROBAXIN) 500 MG tablet  2 times daily     10/31/18 [address], PA-C 10/31/18 1537    Veryl Speak, MD 10/31/18 1631

## 2019-01-02 DIAGNOSIS — R0683 Snoring: Secondary | ICD-10-CM

## 2019-01-08 ENCOUNTER — Emergency Department (HOSPITAL_COMMUNITY): Payer: Self-pay

## 2019-01-08 ENCOUNTER — Other Ambulatory Visit: Payer: Self-pay

## 2019-01-08 ENCOUNTER — Encounter (HOSPITAL_COMMUNITY): Payer: Self-pay | Admitting: *Deleted

## 2019-01-08 ENCOUNTER — Emergency Department (HOSPITAL_COMMUNITY)
Admission: EM | Admit: 2019-01-08 | Discharge: 2019-01-09 | Disposition: A | Discharge status: Home / Self Care | Payer: Self-pay | Attending: Emergency Medicine | Admitting: Emergency Medicine

## 2019-01-08 DIAGNOSIS — Z87891 Personal history of nicotine dependence: Secondary | ICD-10-CM | POA: Insufficient documentation

## 2019-01-08 DIAGNOSIS — R42 Dizziness and giddiness: Secondary | ICD-10-CM | POA: Insufficient documentation

## 2019-01-08 DIAGNOSIS — I7771 Dissection of carotid artery: Secondary | ICD-10-CM | POA: Insufficient documentation

## 2019-01-08 LAB — CBC WITH DIFFERENTIAL/PLATELET
Abs Immature Granulocytes: 0.01 10*3/uL (ref 0.00–0.07)
Basophils Absolute: 0 10*3/uL (ref 0.0–0.1)
Basophils Relative: 0 %
Eosinophils Absolute: 0.1 10*3/uL (ref 0.0–0.5)
Eosinophils Relative: 2 %
HCT: 42.8 % (ref 39.0–52.0)
Hemoglobin: 14.5 g/dL (ref 13.0–17.0)
Immature Granulocytes: 0 %
Lymphocytes Relative: 32 %
Lymphs Abs: 1.9 10*3/uL (ref 0.7–4.0)
MCH: 29.8 pg (ref 26.0–34.0)
MCHC: 33.9 g/dL (ref 30.0–36.0)
MCV: 88.1 fL (ref 80.0–100.0)
Monocytes Absolute: 0.6 10*3/uL (ref 0.1–1.0)
Monocytes Relative: 11 %
Neutro Abs: 3.2 10*3/uL (ref 1.7–7.7)
Neutrophils Relative %: 55 %
Platelets: 188 10*3/uL (ref 150–400)
RBC: 4.86 MIL/uL (ref 4.22–5.81)
RDW: 13.7 % (ref 11.5–15.5)
WBC: 5.8 10*3/uL (ref 4.0–10.5)
nRBC: 0 % (ref 0.0–0.2)

## 2019-01-08 LAB — BASIC METABOLIC PANEL
Anion gap: 8 (ref 5–15)
BUN: 10 mg/dL (ref 6–20)
CO2: 29 mmol/L (ref 22–32)
Calcium: 9.2 mg/dL (ref 8.9–10.3)
Chloride: 103 mmol/L (ref 98–111)
Creatinine, Ser: 1.28 mg/dL — ABNORMAL HIGH (ref 0.61–1.24)
GFR calc Af Amer: >60 mL/min (ref >60)
GFR calc non Af Amer: >60 mL/min (ref >60)
Glucose, Bld: 102 mg/dL — ABNORMAL HIGH (ref 70–99)
Potassium: 4.1 mmol/L (ref 3.5–5.1)
Sodium: 140 mmol/L (ref 135–145)

## 2019-01-08 MED ORDER — KETOROLAC TROMETHAMINE 15 MG/ML IJ SOLN
15.0000 mg | Freq: Once | INTRAMUSCULAR | Status: AC
  Administered 2019-01-08: 15 mg via INTRAVENOUS
  Filled 2019-01-08: qty 1

## 2019-01-08 MED ORDER — IOHEXOL 350 MG/ML SOLN
75.0000 mL | Freq: Once | INTRAVENOUS | Status: AC | PRN
  Administered 2019-01-08: 75 mL via INTRAVENOUS

## 2019-01-08 MED ORDER — GADOBUTROL 1 MMOL/ML IV SOLN
9.0000 mL | Freq: Once | INTRAVENOUS | Status: AC | PRN
  Administered 2019-01-08: 9 mL via INTRAVENOUS

## 2019-01-08 MED ORDER — FLUORESCEIN SODIUM 1 MG OP STRP
ORAL_STRIP | OPHTHALMIC | Status: AC
  Administered 2019-01-08: 1
  Filled 2019-01-08: qty 1

## 2019-01-08 MED ORDER — DIPHENHYDRAMINE HCL 50 MG/ML IJ SOLN
12.5000 mg | Freq: Once | INTRAMUSCULAR | Status: AC
  Administered 2019-01-08: 12.5 mg via INTRAVENOUS
  Filled 2019-01-08: qty 1

## 2019-01-08 MED ORDER — PROCHLORPERAZINE EDISYLATE 10 MG/2ML IJ SOLN
10.0000 mg | Freq: Once | INTRAMUSCULAR | Status: AC
  Administered 2019-01-08: 10 mg via INTRAVENOUS
  Filled 2019-01-08: qty 2

## 2019-01-08 NOTE — ED Provider Notes (Signed)
[facility] EMERGENCY DEPARTMENT Provider Note   CSN: [id number] Arrival date & time: 01/08/19  1539     History   Chief Complaint Chief Complaint  Patient presents with   Headache    HPI Frederick Hess is a 33 y.o. adult.     HPI   Pt is a 33 y/o transgender male to male who states that she has a long h/o headaches but over the last month sxs have been getting worse.  Has seemed to move to different parts of her head, but for the last month headaches seem to be located to the left side of her head and behind the left eye. This particular headache has been constant for the last 2 weeks. Rates pain 7/10. She tried taking sumatriptan which did not resolve her symptoms.   She reports associated photophobia, blurred vision, nausea, and pain with chewing. Reports some Denies vertiginous sxs but does report lightheadedness. Denies jaw pain. Describes pain as a throbbing pain.  Denies associated numbness/weakness, ataxia.  She states that she has recently started working out more and has changed her diet over the last 2 months.    She has never seen a neurologist for her symptoms.  She was seen by here PCP who started her on sumatriptan. States that this medication only works temporarily.   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      Home Medications    Prior to Admission medications     Medication Sig Start Date End Date Taking? Authorizing Provider  acetaminophen (TYLENOL) 500 MG tablet Take 1,000-1,500 mg by mouth every 6 (six) hours as needed for headache (pain).   Yes [provider]  ibuprofen (ADVIL) 200 MG tablet Take 800 mg by mouth every 6 (six) hours as needed for headache (pain).   Yes [provider]  SUMAtriptan (IMITREX) 100 MG tablet Take 100 mg by mouth 2 (two) times daily as needed for migraine.  01/06/19  Yes [provider]  ibuprofen (ADVIL) 600 MG tablet Take 1 tablet (600 mg total) by mouth every 6 (six) hours as needed. Patient not taking: Reported on 01/08/2019 10/31/18   Frederica Kuster, PA-C  methocarbamol (ROBAXIN) 500 MG tablet Take 1 tablet (500 mg total) by mouth 2 (two) times daily. Patient not taking: Reported on 01/08/2019 10/31/18   Frederica Kuster, PA-C    Family History No family history on file.  Social History Social History   Tobacco Use   Smoking status: Former Smoker    Packs/day: 1.00   Smokeless tobacco: Never Used  Substance Use Topics   Alcohol use: Yes    Comment: Rarely   Drug use: Not Currently    Types: Marijuana     Allergies   Amoxicillin   Review of Systems Review of Systems  Constitutional: Negative for fever.  HENT: Negative for  ear pain and sore throat.   Eyes: Positive for photophobia, pain and visual disturbance.  Respiratory: Negative for cough and shortness of breath.   Cardiovascular: Negative for chest pain.  Gastrointestinal: Positive for nausea. Negative for abdominal pain, constipation, diarrhea and vomiting.  Genitourinary: Negative for dysuria and hematuria.  Musculoskeletal: Negative for back pain.  Skin: Negative for color change.  Neurological: Positive for headaches. Negative for dizziness, weakness, light-headedness and numbness.  All other systems reviewed and are negative.    Physical Exam Updated Vital Signs BP 134/86 (BP Location: Right Arm)     Pulse 73    Temp 98.9 F (37.2 C) (Oral)    Resp 18    Ht 6' (1.829 m)    Wt 90.7 kg    BMI 27.12 kg/m   Physical Exam Vitals signs and nursing note reviewed.  Constitutional:      Appearance: She is well-developed.  HENT:     Head: Normocephalic and atraumatic.  Eyes:     Extraocular Movements: Extraocular movements intact.     Right eye: Normal extraocular motion and no nystagmus.     Left eye: Normal extraocular motion and no nystagmus.     Conjunctiva/sclera: Conjunctivae normal.     Pupils: Pupils are equal, round, and reactive to light.     Right eye: Pupil is round and reactive.     Left eye: Pupil is round and reactive.  Neck:     Musculoskeletal: Neck supple.  Cardiovascular:     Rate and Rhythm: Normal rate and regular rhythm.     Heart sounds: Normal heart sounds. No murmur.  Pulmonary:     Effort: Pulmonary effort is normal. No respiratory distress.     Breath sounds: Normal breath sounds. No wheezing or rales.  Abdominal:     General: Bowel sounds are normal.     Palpations: Abdomen is soft.     Tenderness: There is no abdominal tenderness.  Skin:    General: Skin is warm and dry.  Neurological:     Mental Status: She is alert.     Comments: Mental Status:  Alert, thought content appropriate, able to give a coherent history. Speech fluent without evidence of aphasia. Able to follow 2 step commands without difficulty.  Cranial Nerves:  II: pupils equal, round, reactive to light III,IV, VI: ptosis not present, extra-ocular motions intact bilaterally  V,VII: smile symmetric, facial light touch sensation equal VIII: hearing grossly normal to voice  X: uvula elevates symmetrically  XI: bilateral shoulder shrug symmetric and strong XII: midline tongue extension without fassiculations Motor:  Normal tone. 5/5 strength of BUE and BLE major muscle groups including strong and equal grip strength and dorsiflexion/plantar flexion Sensory: light touch normal in all  extremities.      ED Treatments / Results  Labs (all labs ordered are listed, but only abnormal results are displayed) Labs Reviewed  BASIC METABOLIC PANEL - Abnormal; Notable for the following components:      Result Value   Glucose, Bld 102 (*)    Creatinine, Ser 1.28 (*)    All other components within normal limits  CBC WITH DIFFERENTIAL/PLATELET    EKG None  Radiology Ct Angio Head W Or Wo Contrast  Result Date: 01/09/2019 CLINICAL DATA:  Occlusion of left internal carotid artery suspected based on MRI findings. EXAM: CT ANGIOGRAPHY HEAD AND NECK TECHNIQUE: Multidetector CT imaging of the head and neck was performed using the standard protocol during bolus administration of intravenous contrast. Multiplanar  CT image reconstructions and MIPs were obtained to evaluate the vascular anatomy. Carotid stenosis measurements (when applicable) are obtained utilizing NASCET criteria, using the distal internal carotid diameter as the denominator. CONTRAST:  75mL OMNIPAQUE IOHEXOL 350 MG/ML SOLN COMPARISON:  None. FINDINGS: CTA NECK FINDINGS SKELETON: There is no bony spinal canal stenosis. No lytic or blastic lesion. OTHER NECK: Normal pharynx, larynx and major salivary glands. No cervical lymphadenopathy. Unremarkable thyroid gland. UPPER CHEST: No pneumothorax or pleural effusion. No nodules or masses. AORTIC ARCH: There is no calcific atherosclerosis of the aortic arch. There is no aneurysm, dissection or hemodynamically significant stenosis of the visualized portion of the aorta. Conventional 3 vessel aortic branching pattern. The visualized proximal subclavian arteries are widely patent. RIGHT CAROTID SYSTEM: Normal without aneurysm, dissection or stenosis. LEFT CAROTID SYSTEM: The left internal carotid artery is occluded 16 mm beyond the bifurcation. There is an area of tapering just proximal to the occlusion. The remainder of the cervical ICA is occluded with return of enhancement at the  distal cavernous segment. VERTEBRAL ARTERIES: Codominant configuration. Both origins are clearly patent. There is no dissection, occlusion or flow-limiting stenosis to the skull base (V1-V3 segments). CTA HEAD FINDINGS POSTERIOR CIRCULATION: --Vertebral arteries: Normal V4 segments. --Posterior inferior cerebellar arteries (PICA): Patent origins from the vertebral arteries. --Anterior inferior cerebellar arteries (AICA): Patent origins from the basilar artery. --Basilar artery: Normal. --Superior cerebellar arteries: Normal. --Posterior cerebral arteries: Normal. Both originate from the basilar artery. Posterior communicating arteries (p-comm) are diminutive or absent. ANTERIOR CIRCULATION: --Intracranial internal carotid arteries: Normal. --Anterior cerebral arteries (ACA): Normal. Both A1 segments are present. Patent anterior communicating artery (a-comm). --Middle cerebral arteries (MCA): Normal. VENOUS SINUSES: As permitted by contrast timing, patent. ANATOMIC VARIANTS: None Review of the MIP images confirms the above findings. IMPRESSION: 1. Confirmation of occlusion of left internal carotid artery, beginning approximately 16 mm beyond the left carotid bifurcation. 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 2. No intracranial arterial occlusion. Electronically Signed   By: Deatra Robinson M.D.   On: 01/09/2019 00:06   Ct Head Wo Contrast  Result Date: 01/08/2019 CLINICAL DATA:  Headache EXAM: CT HEAD WITHOUT CONTRAST TECHNIQUE: Contiguous axial images were obtained from the base of the skull through the vertex without intravenous contrast. COMPARISON:  CT head dated 05/12/2013. FINDINGS: Brain: No evidence of acute infarction, hemorrhage, hydrocephalus, extra-axial collection or mass lesion/mass effect. Vascular: No hyperdense vessel or unexpected calcification. Skull: Normal. Negative for fracture or focal lesion. Sinuses/Orbits: No acute finding. Other: None. IMPRESSION: No acute  intracranial process. Electronically Signed   By: Romona Curls M.D.   On: 01/08/2019 18:09   Ct Angio Neck W And/or Wo Contrast  Result Date: 01/09/2019 CLINICAL DATA:  Occlusion of left internal carotid artery suspected based on MRI findings. EXAM: CT ANGIOGRAPHY HEAD AND NECK TECHNIQUE: Multidetector CT imaging of the head and neck was performed using the standard protocol during bolus administration of intravenous contrast. Multiplanar CT image reconstructions and MIPs were obtained to evaluate the vascular anatomy. Carotid stenosis measurements (when applicable) are obtained utilizing NASCET criteria, using the distal internal carotid diameter as the denominator. CONTRAST:  75mL OMNIPAQUE IOHEXOL 350 MG/ML SOLN COMPARISON:  None. FINDINGS: CTA NECK FINDINGS SKELETON: There is no bony spinal canal stenosis. No lytic or blastic lesion. OTHER NECK: Normal pharynx, larynx and major salivary glands. No cervical lymphadenopathy. Unremarkable thyroid gland. UPPER CHEST: No pneumothorax or pleural effusion. No nodules or masses. AORTIC ARCH: There is no calcific atherosclerosis of the aortic  arch. There is no aneurysm, dissection or hemodynamically significant stenosis of the visualized portion of the aorta. Conventional 3 vessel aortic branching pattern. The visualized proximal subclavian arteries are widely patent. RIGHT CAROTID SYSTEM: Normal without aneurysm, dissection or stenosis. LEFT CAROTID SYSTEM: The left internal carotid artery is occluded 16 mm beyond the bifurcation. There is an area of tapering just proximal to the occlusion. The remainder of the cervical ICA is occluded with return of enhancement at the distal cavernous segment. VERTEBRAL ARTERIES: Codominant configuration. Both origins are clearly patent. There is no dissection, occlusion or flow-limiting stenosis to the skull base (V1-V3 segments). CTA HEAD FINDINGS POSTERIOR CIRCULATION: --Vertebral arteries: Normal V4 segments. --Posterior  inferior cerebellar arteries (PICA): Patent origins from the vertebral arteries. --Anterior inferior cerebellar arteries (AICA): Patent origins from the basilar artery. --Basilar artery: Normal. --Superior cerebellar arteries: Normal. --Posterior cerebral arteries: Normal. Both originate from the basilar artery. Posterior communicating arteries (p-comm) are diminutive or absent. ANTERIOR CIRCULATION: --Intracranial internal carotid arteries: Normal. --Anterior cerebral arteries (ACA): Normal. Both A1 segments are present. Patent anterior communicating artery (a-comm). --Middle cerebral arteries (MCA): Normal. VENOUS SINUSES: As permitted by contrast timing, patent. ANATOMIC VARIANTS: None Review of the MIP images confirms the above findings. IMPRESSION: 1. Confirmation of occlusion of left internal carotid artery, beginning approximately 16 mm beyond the left carotid bifurcation. 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 2. No intracranial arterial occlusion. Electronically Signed   By: Deatra Robinson M.D.   On: 01/09/2019 00:06   Mr Laqueta Jean And Wo Contrast  Result Date: 01/08/2019 CLINICAL DATA:  Retro-orbital headache for 1 month. EXAM: MRI HEAD WITHOUT AND WITH CONTRAST MRV HEAD WITHOUT AND WITH CONTRAST TECHNIQUE: Multiplanar, multiecho pulse sequences of the brain and surrounding structures were obtained without and with intravenous contrast. Angiographic images of the intracranial venous structures were obtained using MRV technique without and with intravenous contrast. CONTRAST:  86mL GADAVIST GADOBUTROL 1 MMOL/ML IV SOLN COMPARISON:  Head CT 01/08/2019 FINDINGS: MRI BRAIN FINDINGS BRAIN: There is no acute infarct, acute hemorrhage or extra-axial collection. The white matter signal is normal for the patient's age. The cerebral and cerebellar volume are age-appropriate. There is no hydrocephalus. The midline structures are normal. VASCULAR: There is an abnormal left internal carotid artery  flow void at the skull base with loss of normal intraluminal contrast enhancement of the distal cervical portion. Susceptibility-sensitive sequences show no chronic microhemorrhage or superficial siderosis. SKULL AND UPPER CERVICAL SPINE: Calvarial bone marrow signal is normal. There is no skull base mass. The visualized upper cervical spine and soft tissues are normal. SINUSES/ORBITS: There are no fluid levels or advanced mucosal thickening. The mastoid air cells and middle ear cavities are free of fluid. The orbits are normal. MRV BRAIN FINDINGS Superior sagittal sinus: Normal. Straight sinus: Normal. Inferior sagittal sinus, vein of Galen and internal cerebral veins: Normal. Transverse sinuses: Normal. Sigmoid sinuses: Normal. Visualized jugular veins: Normal. IMPRESSION: 1. Suspected occlusion of the left internal carotid artery distal cervical and skull base segments. CTA of the head/neck would be confirmatory. 2. No acute ischemia. 3. Normal MRV of the brain. Electronically Signed   By: Deatra Robinson M.D.   On: 01/08/2019 22:42   Mr Mrv Almyra Deforest ER Contrast  Result Date: 01/08/2019 CLINICAL DATA:  Retro-orbital headache for 1 month. EXAM: MRI HEAD WITHOUT AND WITH CONTRAST MRV HEAD WITHOUT AND WITH CONTRAST TECHNIQUE: Multiplanar, multiecho pulse sequences of the brain and surrounding structures were obtained without and with intravenous contrast. Angiographic images  of the intracranial venous structures were obtained using MRV technique without and with intravenous contrast. CONTRAST:  9mL GADAVIST GADOBUTROL 1 MMOL/ML IV SOLN COMPARISON:  Head CT 01/08/2019 FINDINGS: MRI BRAIN FINDINGS BRAIN: There is no acute infarct, acute hemorrhage or extra-axial collection. The white matter signal is normal for the patient's age. The cerebral and cerebellar volume are age-appropriate. There is no hydrocephalus. The midline structures are normal. VASCULAR: There is an abnormal left internal carotid artery flow void at  the skull base with loss of normal intraluminal contrast enhancement of the distal cervical portion. Susceptibility-sensitive sequences show no chronic microhemorrhage or superficial siderosis. SKULL AND UPPER CERVICAL SPINE: Calvarial bone marrow signal is normal. There is no skull base mass. The visualized upper cervical spine and soft tissues are normal. SINUSES/ORBITS: There are no fluid levels or advanced mucosal thickening. The mastoid air cells and middle ear cavities are free of fluid. The orbits are normal. MRV BRAIN FINDINGS Superior sagittal sinus: Normal. Straight sinus: Normal. Inferior sagittal sinus, vein of Galen and internal cerebral veins: Normal. Transverse sinuses: Normal. Sigmoid sinuses: Normal. Visualized jugular veins: Normal. IMPRESSION: 1. Suspected occlusion of the left internal carotid artery distal cervical and skull base segments. CTA of the head/neck would be confirmatory. 2. No acute ischemia. 3. Normal MRV of the brain. Electronically Signed   By: Deatra RobinsonKevin  Herman M.D.   On: 01/08/2019 22:42    Procedures Procedures (including critical care time)  Medications Ordered in ED Medications  ketorolac (TORADOL) 15 MG/ML injection 15 mg (15 mg Intravenous Given 01/08/19 1741)  prochlorperazine (COMPAZINE) injection 10 mg (10 mg Intravenous Given 01/08/19 1741)  diphenhydrAMINE (BENADRYL) injection 12.5 mg (12.5 mg Intravenous Given 01/08/19 1741)  gadobutrol (GADAVIST) 1 MMOL/ML injection 9 mL (9 mLs Intravenous Contrast Given 01/08/19 2147)  iohexol (OMNIPAQUE) 350 MG/ML injection 75 mL (75 mLs Intravenous Contrast Given 01/08/19 2324)  fluorescein 1 MG ophthalmic strip (1 strip  Given 01/08/19 2358)     Initial Impression / Assessment and Plan / ED Course  I have reviewed the triage vital signs and the nursing notes.  Pertinent labs & imaging results that were available during my care of the patient were reviewed by me and considered in my medical decision making (see  chart for details).     Final Clinical Impressions(s) / ED Diagnoses   Final diagnoses:  None   33 y/o with h/o headache presenting with worsening/different headache for the last month. Reports associated left eye pain and blurred vision intermittently. No other associated neuro complaints. Sxs associated with H/o HRT earlier this year.   Neuro exam normal Visual acuity: B/L near 10/12.5, R near: 10/16, L near: 10/20  - reassuring No uptake on fluorescein stain Tonometry reassuring <20 mmHg bilaterally  CT head negative.   MRI/MRV obtained due to concern for persistent HA in setting of recent HRT which showed  1. Suspected occlusion of the left internal carotid artery distal cervical and skull base segments. CTA of the head/neck would be confirmatory. 2. No acute ischemia. 3. Normal MRV of the brain.  - CTA of head/neck was recommended.   CTA head/neck with confirmation of occlusion of left internal carotid artery, beginning approximately 16 mm beyond the left carotid bifurcation. 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 No intracranial arterial occlusion.   Feels improved after headache cocktail. She states that vision has improved and she does not really have any more photophobia.   At shift change, pending consult with vascular surgery. Care transitioned to  Elpidio Anis, PA-C who will f/u on pending consult.     ED Discharge Orders    None       Karrie Meres, [place] 01/09/19 0454    Pricilla Loveless, MD 01/09/19 [phone]

## 2019-01-08 NOTE — ED Triage Notes (Signed)
Pt states headache behind eyes x 1 month.  Given sumatriptin by MD that only relieves the pain for 3 hours then the pain comes back, so pt was told to come to ED by physician.    Also c/o blurred vision with the pain and nausea.

## 2019-01-08 NOTE — ED Notes (Signed)
Patient transported to CT 

## 2019-01-09 MED ORDER — ASPIRIN 81 MG PO CHEW: 324.0000 mg | CHEWABLE_TABLET | Freq: Every day | ORAL | 0 refills | Status: DC

## 2019-01-09 NOTE — Discharge Instructions (Addendum)
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 This has been reviewed by our vascular specialist and is stable, allowing for discharge home and office follow up.   It is important to take an aspirin daily to treat the existing problem and prevent further clotting.  Return to the ED with any recurrent headache pain, visual loss, new extremity weakness or numbness, speech difficulty, facial drooping, or new concern.

## 2019-01-09 NOTE — ED Provider Notes (Signed)
H/o HA's, worse in 2 weeks, worse still last 2 days with visual changes CT negative Occlusion left internal carotid w/o infarct on MRI w and w/o head Now having CTA per radiology recommendation Pain is better  Review CTA, ?neuro consult  CTA shows previously seen occlusion of left carotid artery, likely indicates internal carotid artery dissection.   12:20 - These findings were discussed with Dr. Carlis Abbott who will review imaging and advise.   1:00 - Per Dr. Carlis Abbott, dissection seen on imaging is likely subacute, starting when headache started one month ago. As there is thrombosis, no immediate intervention is required. Recommends daily aspirin and patient can follow up in the office.   Confirmed no ss/sxs stroke on HPI, PE of previous treatment team. Re-examination finds no deficits. She continues to be pain free/symptom free.   Patient updated on all study results and plan. She is comfortable with plan of discharge.     Charlann Lange, PA-C 01/09/19 1610    Sherwood Gambler, MD 01/09/19 [phone]

## 2019-01-09 NOTE — ED Notes (Signed)
Patient verbalizes understanding of discharge instructions. Opportunity for questioning and answers were provided. Armband removed by staff, pt discharged from ED.  

## 2019-02-06 ENCOUNTER — Ambulatory Visit (HOSPITAL_COMMUNITY)
Admission: EM | Admit: 2019-02-06 | Discharge: 2019-02-06 | Disposition: A | Discharge status: Home / Self Care | Payer: Self-pay | Attending: Family Medicine | Admitting: Family Medicine

## 2019-02-06 ENCOUNTER — Encounter (HOSPITAL_COMMUNITY): Payer: Self-pay

## 2019-02-06 ENCOUNTER — Other Ambulatory Visit: Payer: Self-pay

## 2019-02-06 DIAGNOSIS — R300 Dysuria: Secondary | ICD-10-CM | POA: Insufficient documentation

## 2019-02-06 LAB — POCT URINALYSIS DIP (DEVICE)
Bilirubin Urine: NEGATIVE
Glucose, UA: NEGATIVE mg/dL
Hgb urine dipstick: NEGATIVE
Ketones, ur: NEGATIVE mg/dL
Nitrite: NEGATIVE
Protein, ur: NEGATIVE mg/dL
Specific Gravity, Urine: 1.025 (ref 1.005–1.030)
Urobilinogen, UA: 0.2 mg/dL (ref 0.0–1.0)
pH: 6 (ref 5.0–8.0)

## 2019-02-06 MED ORDER — CEFTRIAXONE SODIUM 250 MG IJ SOLR
INTRAMUSCULAR | Status: AC
  Filled 2019-02-06: qty 250

## 2019-02-06 MED ORDER — AZITHROMYCIN 250 MG PO TABS: 1000.0000 mg | ORAL_TABLET | Freq: Once | ORAL | Status: DC

## 2019-02-06 MED ORDER — AZITHROMYCIN 250 MG PO TABS: 1000.0000 mg | ORAL_TABLET | Freq: Every day | ORAL | 0 refills | Status: DC

## 2019-02-06 MED ORDER — LIDOCAINE HCL (PF) 1 % IJ SOLN
INTRAMUSCULAR | Status: AC
  Filled 2019-02-06: qty 2

## 2019-02-06 MED ORDER — CEFTRIAXONE SODIUM 250 MG IJ SOLR
250.0000 mg | Freq: Once | INTRAMUSCULAR | Status: AC
  Administered 2019-02-06: 15:00:00 250 mg via INTRAMUSCULAR

## 2019-02-06 NOTE — ED Triage Notes (Signed)
Pt present urinary frequency and abdominal pain. Symptoms started over a week ago. Pt states she is having a tingling feeling after she urinates.

## 2019-02-06 NOTE — Discharge Instructions (Signed)
Urine sent for culture. Swab sent for STD testing  Treated today here for STDs.  We will call you with any positive results.

## 2019-02-06 NOTE — ED Provider Notes (Signed)
[facility]    CSN: [id number] Arrival date & time: 02/06/19  1408      History   Chief Complaint Chief Complaint  Patient presents with  . Abdominal Pain  . Urinary Frequency    HPI CHOICE Frederick Hess is a 33 y.o. adult.   Pt is a 33 year old transgender male that presents with dysuria, lower abdominal discomfort x 1 week. Symptoms have been constant waxing and waning.  Concerned for STDs. Currently in a sexual relationship with a woman. Some groin swelling. No penile discharge or fever.   ROS per HPI     Past Medical History:  Diagnosis Date  . Hormone replacement therapy   . Transexualism     Patient Active Problem List   Diagnosis Date Noted  . Depression (emotion) 10/30/2013  . 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 Substance abuse (HCC) 10/10/2013  . Borderline personality disorder (HCC) 10/10/2013    Past Surgical History:  Procedure Laterality Date  . COSMETIC SURGERY     rhinoplasty  . RHINOPLASTY      OB History   No obstetric history on file.      Home Medications    Prior to Admission medications   Medication Sig Start Date End Date Taking? Authorizing Provider  acetaminophen (TYLENOL) 500 MG tablet Take 1,000-1,500 mg by mouth every 6 (six) hours as needed for headache (pain).    [provider]  aspirin 81 MG chewable tablet Chew 4 tablets (324 mg total) by mouth daily. 01/09/19   Elpidio Anis, PA-C  azithromycin (ZITHROMAX) 250 MG tablet Take 4 tablets (1,000 mg total) by mouth daily. Take first 2 tablets together, then 1 every day until finished. 02/06/19   Dahlia Byes A, NP  ibuprofen (ADVIL) 200 MG tablet Take 800 mg by mouth every 6 (six) hours as needed for headache (pain).    [provider]  ibuprofen (ADVIL) 600 MG tablet Take 1 tablet (600 mg total) by mouth every 6 (six) hours as needed. Patient not taking: Reported on 01/08/2019 10/31/18   Emi Holes, PA-C  methocarbamol (ROBAXIN) 500 MG tablet Take 1 tablet (500 mg total) by mouth 2 (two) times daily. Patient not taking: Reported on 01/08/2019 10/31/18   Emi Holes, PA-C  SUMAtriptan (IMITREX) 100 MG tablet Take 100 mg by mouth 2 (two) times daily as needed for migraine.  01/06/19   [provider]    Family History History reviewed. No pertinent family history.  Social History Social History   Tobacco Use  . Smoking status: Former Smoker    Packs/day: 1.00  . Smokeless tobacco: Never Used  Substance Use Topics  . Alcohol use: Yes    Comment: Rarely  . Drug use: Not Currently    Types: Marijuana     Allergies   Amoxicillin   Review of Systems Review of Systems   Physical Exam Triage Vital Signs ED Triage Vitals [02/06/19 1434]  Enc Vitals Group     BP 110/73     Pulse Rate 68     Resp 16     Temp 98.2 F (36.8 C)     Temp Source Oral     SpO2 99 %     Weight      Height      Head Circumference      Peak Flow      Pain Score 8     Pain Loc  Pain Edu?      Excl. in [place]?    No data found.  Updated Vital Signs BP 110/73 (BP Location: Left Arm)   Pulse 68   Temp 98.2 F (36.8 C) (Oral)   Resp 16   SpO2 99%   Visual Acuity Right Eye Distance:   Left Eye Distance:   Bilateral Distance:    Right Eye Near:   Left Eye Near:    Bilateral Near:     Physical Exam Vitals signs and nursing note reviewed.  Constitutional:      Appearance: Normal appearance.  HENT:     Head: Normocephalic and atraumatic.     Nose: Nose normal.  Eyes:     Conjunctiva/sclera: Conjunctivae normal.  Neck:     Musculoskeletal: Normal range of motion.  Pulmonary:     Effort: Pulmonary effort is normal.  Abdominal:     Palpations: Abdomen is soft.     Tenderness: There is no abdominal tenderness.  Musculoskeletal: Normal range of motion.  Lymphadenopathy:     Lower Body: Right inguinal adenopathy present.  Skin:    General: Skin is warm and  dry.  Neurological:     Mental Status: She is alert.  Psychiatric:        Mood and Affect: Mood normal.      UC Treatments / Results  Labs (all labs ordered are listed, but only abnormal results are displayed) Labs Reviewed  POCT URINALYSIS DIP (DEVICE) - Abnormal; Notable for the following components:      Result Value   Leukocytes,Ua SMALL (*)    All other components within normal limits  URINE CULTURE  CYTOLOGY, (ORAL, ANAL, URETHRAL) ANCILLARY ONLY    EKG   Radiology No results found.  Procedures Procedures (including critical care time)  Medications Ordered in UC Medications  cefTRIAXone (ROCEPHIN) injection 250 mg (250 mg Intramuscular Given 02/06/19 1521)  cefTRIAXone (ROCEPHIN) 250 MG injection (has no administration in time range)  lidocaine (PF) (XYLOCAINE) 1 % injection (has no administration in time range)    Initial Impression / Assessment and Plan / UC Course  I have reviewed the triage vital signs and the nursing notes.  Pertinent labs & imaging results that were available during my care of the patient were reviewed by me and considered in my medical decision making (see chart for details).     Dysuria- based on symptoms and concerned exposure sending swab for testing.  Treating prophylactically here for gonorrhea and chlamydia Urine with trace leuks urine was sent for culture. Follow up as needed for continued or worsening symptoms  Final Clinical Impressions(s) / UC Diagnoses   Final diagnoses:  Dysuria     Discharge Instructions     Urine sent for culture. Swab sent for STD testing  Treated today here for STDs.  We will call you with any positive results.      ED Prescriptions    Medication Sig Dispense Auth. Provider   azithromycin (ZITHROMAX) 250 MG tablet Take 4 tablets (1,000 mg total) by mouth daily. Take first 2 tablets together, then 1 every day until finished. 4 tablet [name], NP     PDMP not reviewed this  encounter.   Orvan July, NP 02/06/19 1627

## 2019-02-07 ENCOUNTER — Telehealth (HOSPITAL_COMMUNITY): Payer: Self-pay | Admitting: Emergency Medicine

## 2019-02-07 LAB — URINE CULTURE: Culture: NO GROWTH

## 2019-02-07 LAB — CYTOLOGY, (ORAL, ANAL, URETHRAL) ANCILLARY ONLY
Chlamydia: NEGATIVE
Neisseria Gonorrhea: NEGATIVE
Trichomonas: NEGATIVE

## 2019-02-07 MED ORDER — AZITHROMYCIN 250 MG PO TABS: 1000.0000 mg | ORAL_TABLET | Freq: Once | ORAL | 0 refills | Status: AC

## 2019-02-07 NOTE — Telephone Encounter (Signed)
Resending medication  °

## 2019-02-21 ENCOUNTER — Encounter: Payer: Self-pay | Admitting: Vascular Surgery

## 2019-03-01 ENCOUNTER — Encounter: Payer: Self-pay | Admitting: Family

## 2019-03-29 ENCOUNTER — Other Ambulatory Visit: Payer: Self-pay

## 2019-03-29 ENCOUNTER — Encounter (HOSPITAL_COMMUNITY): Payer: Self-pay

## 2019-03-29 ENCOUNTER — Emergency Department (HOSPITAL_COMMUNITY)
Admission: EM | Admit: 2019-03-29 | Discharge: 2019-03-29 | Disposition: A | Discharge status: Home / Self Care | Payer: Self-pay | Attending: Emergency Medicine | Admitting: Emergency Medicine

## 2019-03-29 DIAGNOSIS — Z87891 Personal history of nicotine dependence: Secondary | ICD-10-CM | POA: Insufficient documentation

## 2019-03-29 DIAGNOSIS — M7702 Medial epicondylitis, left elbow: Secondary | ICD-10-CM | POA: Insufficient documentation

## 2019-03-29 MED ORDER — NAPROXEN 500 MG PO TABS: 500.0000 mg | ORAL_TABLET | Freq: Two times a day (BID) | ORAL | 0 refills | Status: DC

## 2019-03-29 NOTE — ED Provider Notes (Signed)
[facility] Emergency Department Provider Note MRN:  [id number]  Arrival date & time: 03/29/19     Chief Complaint   Arm Pain and Arm Swelling   History of Present Illness   Frederick Hess is a 34 y.o. year-old adult with no pertinent past medical history presenting to the ED with chief complaint of arm pain and arm swelling.  2 weeks of localized pain to the inner aspect of the left elbow.  When using the arm, this area seems to swell.  Works out multiple times a day.  Tried to rest it for a few days but is always having to lift things for work.  Pain is mild to moderate, constant, worse with motion or palpation.  Review of Systems  A complete 10 system review of systems was obtained and all systems are negative except as noted in the HPI and PMH.   Patient's Health History    Past Medical History:  Diagnosis Date  . Hormone replacement therapy   . Transexualism     Past Surgical History:  Procedure Laterality Date  . COSMETIC SURGERY     rhinoplasty  . RHINOPLASTY      Family History  Problem Relation Age of Onset  . Diabetes Mother     Social History   Socioeconomic History  . Marital status: Single    Spouse name: Not on file  . Number of children: Not on file  . Years of education: Not on file  . Highest education level: Not on file  Occupational History  . Not on file  Tobacco Use  . Smoking status: Former Smoker    Packs/day: 1.00  . Smokeless tobacco: Never Used  Substance and Sexual Activity  . Alcohol use: Not Currently  . Drug use: Not Currently    Types: Marijuana  . Sexual activity: Not on file  Other Topics Concern  . Not on file  Social History Narrative  . Not on file   Social Determinants of Health   Financial Resource Strain:   . Difficulty of Paying Living Expenses: Not on file  Food Insecurity:   . Worried About Charity fundraiser in the Last Year: Not on file  . Ran Out of Food in the Last Year: Not on  file  Transportation Needs:   . Lack of Transportation (Medical): Not on file  . Lack of Transportation (Non-Medical): Not on file  Physical Activity:   . Days of Exercise per Week: Not on file  . Minutes of Exercise per Session: Not on file  Stress:   . Feeling of Stress : Not on file  Social Connections:   . Frequency of Communication with Friends and Family: Not on file  . Frequency of Social Gatherings with Friends and Family: Not on file  . Attends Religious Services: Not on file  . Active Member of Clubs or Organizations: Not on file  . Attends Archivist Meetings: Not on file  . Marital Status: Not on file  Intimate Partner Violence:   . Fear of Current or Ex-Partner: Not on file  . Emotionally Abused: Not on file  . Physically Abused: Not on file  . Sexually Abused: Not on file     Physical Exam  Vital Signs and Nursing Notes reviewed Vitals:   03/29/19 0715  BP: 139/78  Pulse: 77  Resp: 14  Temp: 97.6 F (36.4 C)  SpO2: 100%    CONSTITUTIONAL: Well-appearing, NAD NEURO:  Alert and oriented  x 3, no focal deficits EYES:  eyes equal and reactive ENT/NECK:  no LAD, no JVD CARDIO: Regular rate, well-perfused, normal S1 and S2 PULM:  CTAB no wheezing or rhonchi GI/GU:  normal bowel sounds, non-distended, non-tender MSK/SPINE: Mild edema to the left medial epicondyles with moderate tenderness to palpation, preserved range of motion of the elbow joint SKIN:  no rash, atraumatic PSYCH:  Appropriate speech and behavior  Diagnostic and Interventional Summary    EKG Interpretation  Date/Time:    Ventricular Rate:    PR Interval:    QRS Duration:   QT Interval:    QTC Calculation:   R Axis:     Text Interpretation:        Labs Reviewed - No data to display  No orders to display    Medications - No data to display   Procedures  /  Critical Care Procedures  ED Course and Medical Decision Making  I have reviewed the triage vital signs, the  nursing notes, and pertinent available records from the EMR.  Pertinent labs & imaging results that were available during my care of the patient were reviewed by me and considered in my medical decision making (see below for details).     Consistent with medial epicondylitis, nothing to suggest septic joint or other infectious process.  Appropriate for conservative management and discharge.    Elmer Sow. Pilar Plate, MD [facility] Emergency Medicine [facility] [email or web address]  Final Clinical Impressions(s) / ED Diagnoses     ICD-10-CM   1. Medial epicondylitis of left elbow  M77.02     ED Discharge Orders         Ordered    naproxen (NAPROSYN) 500 MG tablet  2 times daily     03/29/19 0731           Discharge Instructions Discussed with and Provided to Patient:     Discharge Instructions     You were evaluated in the Emergency Department and after careful evaluation, we did not find any emergent condition requiring admission or further testing in the hospital.  Your exam/testing today was overall reassuring.  Your symptoms seem to be due to inflammation of the tendons in the arm where they attach to the elbow bones.  This is also known as golfers elbow.  It is common in golfers but it can happen to anyone doing repetitive activities.  Please take the Naprosyn anti-inflammatory medication as directed and rest the arm for the next week.  If not improved after that, we recommend follow-up with an orthopedic specialist.  Please return to the Emergency Department if you experience any worsening of your condition.  We encourage you to follow up with a primary care provider.  Thank you for allowing [place] to be a part of your care.       Sabas Sous, MD 03/29/19 [phone]

## 2019-03-29 NOTE — Discharge Instructions (Signed)
You were evaluated in the Emergency Department and after careful evaluation, we did not find any emergent condition requiring admission or further testing in the hospital.  Your exam/testing today was overall reassuring.  Your symptoms seem to be due to inflammation of the tendons in the arm where they attach to the elbow bones.  This is also known as golfers elbow.  It is common in golfers but it can happen to anyone doing repetitive activities.  Please take the Naprosyn anti-inflammatory medication as directed and rest the arm for the next week.  If not improved after that, we recommend follow-up with an orthopedic specialist.  Please return to the Emergency Department if you experience any worsening of your condition.  We encourage you to follow up with a primary care provider.  Thank you for allowing [place] to be a part of your care.

## 2019-03-29 NOTE — ED Notes (Signed)
ED Provider at bedside. 

## 2019-03-29 NOTE — ED Triage Notes (Signed)
Patient c/o left arm swelling and pain. 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

## 2019-04-14 ENCOUNTER — Emergency Department (HOSPITAL_COMMUNITY)
Admission: EM | Admit: 2019-04-14 | Discharge: 2019-04-14 | Disposition: A | Discharge status: Home / Self Care | Payer: Self-pay | Attending: Emergency Medicine | Admitting: Emergency Medicine

## 2019-04-14 ENCOUNTER — Other Ambulatory Visit: Payer: Self-pay

## 2019-04-14 ENCOUNTER — Encounter (HOSPITAL_COMMUNITY): Payer: Self-pay

## 2019-04-14 DIAGNOSIS — M545 Low back pain, unspecified: Secondary | ICD-10-CM

## 2019-04-14 DIAGNOSIS — R1032 Left lower quadrant pain: Secondary | ICD-10-CM

## 2019-04-14 DIAGNOSIS — Z7982 Long term (current) use of aspirin: Secondary | ICD-10-CM | POA: Insufficient documentation

## 2019-04-14 DIAGNOSIS — R109 Unspecified abdominal pain: Secondary | ICD-10-CM | POA: Insufficient documentation

## 2019-04-14 DIAGNOSIS — Z87891 Personal history of nicotine dependence: Secondary | ICD-10-CM | POA: Insufficient documentation

## 2019-04-14 LAB — URINALYSIS, ROUTINE W REFLEX MICROSCOPIC
Bilirubin Urine: NEGATIVE
Glucose, UA: NEGATIVE mg/dL
Hgb urine dipstick: NEGATIVE
Ketones, ur: NEGATIVE mg/dL
Leukocytes,Ua: NEGATIVE
Nitrite: NEGATIVE
Protein, ur: NEGATIVE mg/dL
Specific Gravity, Urine: 1.012 (ref 1.005–1.030)
pH: 6 (ref 5.0–8.0)

## 2019-04-14 MED ORDER — NAPROXEN 500 MG PO TABS
500.0000 mg | ORAL_TABLET | Freq: Once | ORAL | Status: AC
  Administered 2019-04-14: 500 mg via ORAL
  Filled 2019-04-14: qty 1

## 2019-04-14 MED ORDER — MELOXICAM 15 MG PO TABS: ORAL_TABLET | ORAL | 0 refills | Status: DC

## 2019-04-14 NOTE — ED Triage Notes (Signed)
Pt reports lower abdominal pain and lower back pain. Denies injury. Denies N/V/D. Denies hematuria, but endorses polyuria. States that she is unsure when the frequent urination started.

## 2019-04-14 NOTE — ED Provider Notes (Signed)
[facility] Provider Note: Lowella Dell, MD, FACEP  CSN: [id number] MRN: [id number] ARRIVAL: 04/14/19 at 0416 ROOM: WA16/WA16   CHIEF COMPLAINT  Abdominal Pain   HISTORY OF PRESENT ILLNESS  04/14/19 4:38 AM Frederick Hess is a 34 y.o. male to male transgender patient.  The patient complains of abdominal pain for the past week and a half.  The abdominal pain is located in the left inguinal region and laterally just above the iliac crest.  It does not involve the iliac crest itself.  The pain is described as burning and aching.  It is rated as an 8 out of 10, worse with movement.  There is also pain in the back located about the SI joint radiating around to the left groin.  The pain has been intermittent.  There has been no trauma.  There has been increased urination but no burning with urination or urethral discharge.  There has been no fever, chills, nausea, vomiting or diarrhea.   Past Medical History:  Diagnosis Date  . Hormone replacement therapy   . Transexualism     Past Surgical History:  Procedure Laterality Date  . COSMETIC SURGERY     rhinoplasty  . RHINOPLASTY      Family History  Problem Relation Age of Onset  . Diabetes Mother     Social History   Tobacco Use  . Smoking status: Former Smoker    Packs/day: 1.00  . Smokeless tobacco: Never Used  Substance Use Topics  . Alcohol use: Not Currently  . Drug use: Not Currently    Types: Marijuana    Prior to Admission medications   Medication Sig Start Date End Date Taking? Authorizing Provider  aspirin 81 MG chewable tablet Chew 4 tablets (324 mg total) by mouth daily. 01/09/19   Elpidio Anis, PA-C  SUMAtriptan (IMITREX) 100 MG tablet Take 100 mg by mouth 2 (two) times daily as needed for migraine.  01/06/19   [provider]    Allergies Amoxicillin   REVIEW OF SYSTEMS  Negative except as noted here or in the History of Present Illness.   PHYSICAL EXAMINATION  Initial  Vital Signs Blood pressure 127/86, pulse 65, temperature 97.9 F (36.6 C), temperature source Oral, resp. rate 16, SpO2 100 %.  Examination General: Well-developed, well-nourished adult in no acute distress; appearance consistent with age of record HENT: normocephalic; atraumatic Eyes: pupils equal, round and reactive to light; extraocular muscles intact Neck: supple Heart: regular rate and rhythm Lungs: clear to auscultation bilaterally Chest: Partial breast development Abdomen: soft; nondistended; mild lateral tenderness just above the iliac crest; no masses or hepatosplenomegaly; bowel sounds present Back: No SI tenderness Extremities: No deformity; full range of motion; pulses normal Neurologic: Awake, alert and oriented; motor function intact in all extremities and symmetric; no facial droop Skin: Warm and dry Psychiatric: Normal mood and affect   RESULTS  Summary of this visit's results, reviewed and interpreted by myself:   EKG Interpretation  Date/Time:    Ventricular Rate:    PR Interval:    QRS Duration:   QT Interval:    QTC Calculation:   R Axis:     Text Interpretation:        Laboratory Studies: Results for orders placed or performed during the hospital encounter of 04/14/19 (from the past 24 hour(s))  Urinalysis, Routine w reflex microscopic     Status: None   Collection Time: 04/14/19  4:39 AM  Result Value Ref Range   Color,  Urine YELLOW YELLOW   APPearance CLEAR CLEAR   Specific Gravity, Urine 1.012 1.005 - 1.030   pH 6.0 5.0 - 8.0   Glucose, UA NEGATIVE NEGATIVE mg/dL   Hgb urine dipstick NEGATIVE NEGATIVE   Bilirubin Urine NEGATIVE NEGATIVE   Ketones, ur NEGATIVE NEGATIVE mg/dL   Protein, ur NEGATIVE NEGATIVE mg/dL   Nitrite NEGATIVE NEGATIVE   Leukocytes,Ua NEGATIVE NEGATIVE   Imaging Studies: No results found.  ED COURSE and MDM  Nursing notes, initial and subsequent vitals signs, including pulse oximetry, reviewed and interpreted by  myself.  Vitals:   04/14/19 0425  BP: 127/86  Pulse: 65  Resp: 16  Temp: 97.9 F (36.6 C)  TempSrc: Oral  SpO2: 100%   Medications  naproxen (NAPROSYN) tablet 500 mg (has no administration in time range)   The patient's presentation is atypical for pain of intra-abdominal etiology.  I am more concerned about sacroiliitis is it frequently presents with left lower back pain radiating to the left inguinal region.  We will trial a course of Mobic as the patient has been taking naproxen recently for medial epicondylitis bilaterally without complete relief.   PROCEDURES  Procedures   ED DIAGNOSES     ICD-10-CM   1. Left groin pain  R10.32   2. Acute left-sided low back pain without sciatica  M54.5        Aline Wesche, Jenny Reichmann, MD 04/14/19 [phone]

## 2019-04-17 LAB — GC/CHLAMYDIA PROBE AMP (~~LOC~~) NOT AT ARMC
Chlamydia: NEGATIVE
Neisseria Gonorrhea: NEGATIVE

## 2019-04-19 ENCOUNTER — Encounter: Payer: Self-pay | Admitting: Surgery

## 2019-05-15 ENCOUNTER — Encounter: Payer: Self-pay | Admitting: Surgery

## 2019-05-17 ENCOUNTER — Encounter (HOSPITAL_COMMUNITY): Payer: Self-pay

## 2019-05-17 ENCOUNTER — Other Ambulatory Visit: Payer: Self-pay

## 2019-05-17 ENCOUNTER — Ambulatory Visit (HOSPITAL_COMMUNITY)
Admission: EM | Admit: 2019-05-17 | Discharge: 2019-05-17 | Disposition: A | Discharge status: Home / Self Care | Payer: Self-pay | Attending: Urgent Care | Admitting: Urgent Care

## 2019-05-17 DIAGNOSIS — R102 Pelvic and perineal pain: Secondary | ICD-10-CM | POA: Insufficient documentation

## 2019-05-17 DIAGNOSIS — Z7251 High risk heterosexual behavior: Secondary | ICD-10-CM | POA: Insufficient documentation

## 2019-05-17 LAB — POCT URINALYSIS DIP (DEVICE)
Bilirubin Urine: NEGATIVE
Glucose, UA: NEGATIVE mg/dL
Ketones, ur: NEGATIVE mg/dL
Nitrite: NEGATIVE
Protein, ur: NEGATIVE mg/dL
Specific Gravity, Urine: 1.025 (ref 1.005–1.030)
Urobilinogen, UA: 1 mg/dL (ref 0.0–1.0)
pH: 5.5 (ref 5.0–8.0)

## 2019-05-17 MED ORDER — AZITHROMYCIN 250 MG PO TABS: 1000.0000 mg | ORAL_TABLET | Freq: Once | ORAL | Status: DC

## 2019-05-17 MED ORDER — CEFTRIAXONE SODIUM 500 MG IJ SOLR: 500.0000 mg | Freq: Once | INTRAMUSCULAR | Status: DC

## 2019-05-17 NOTE — ED Triage Notes (Signed)
Pt states she's having lower abdominal pain x 2 weeks or more. Pt states that the pain gets worst when she voids.

## 2019-05-17 NOTE — ED Provider Notes (Signed)
[place]   MRN: [id number] DOB: 03-28-1985  Subjective:   Frederick Hess is a 34 y.o. adult presenting for 2-week history of intermittent lower belly pain worse when she voids.  Patient is very concerned about having an STI, states that she is sexually active and doesn't always use protection.  Denies penile discharge, dysuria, genital rash.  Of note, patient was seen in January 2021, November 2020, July 2020 for very similar complaints.  Her testing for gonorrhea, chlamydia and trichomonas have consistently been negative.  No current facility-administered medications for this encounter.  Current Outpatient Medications:  .  aspirin 81 MG chewable tablet, Chew 4 tablets (324 mg total) by mouth daily., Disp: 30 tablet, Rfl: 0 .  meloxicam (MOBIC) 15 MG tablet, Take 1 tablet daily as needed for back and groin pain.  Discontinue naproxen., Disp: 15 tablet, Rfl: 0 .  SUMAtriptan (IMITREX) 100 MG tablet, Take 100 mg by mouth 2 (two) times daily as needed for migraine. , Disp: , Rfl:    Allergies  Allergen Reactions  . Amoxicillin Anaphylaxis and Hives    Has patient had a PCN reaction causing immediate rash, facial/tongue/throat swelling, SOB or lightheadedness with hypotension: Yes Has patient had a PCN reaction causing severe rash involving mucus membranes or skin necrosis: Yes Has patient had a PCN reaction that required hospitalization: ED visit Has patient had a PCN reaction occurring within the last 10 years: Yes, in 2015 If all of the above answers are "NO", then may proceed with Cephalosporin use.     Past Medical History:  Diagnosis Date  . Hormone replacement therapy   . Transexualism      Past Surgical History:  Procedure Laterality Date  . COSMETIC SURGERY     rhinoplasty  . RHINOPLASTY      Family History  Problem Relation Age of Onset  . Diabetes Mother     Social History   Tobacco Use  . Smoking status: Former Smoker    Packs/day: 1.00  .  Smokeless tobacco: Never Used  Substance Use Topics  . Alcohol use: Not Currently  . Drug use: Not Currently    Types: Marijuana    ROS   Objective:   Vitals: BP 125/79 (BP Location: Right Arm)   Pulse 84   Temp 98.3 F (36.8 C) (Oral)   Resp 18   SpO2 98%   Physical Exam Constitutional:      General: She is not in acute distress.    Appearance: Normal appearance. She is well-developed and normal weight. She is not ill-appearing, toxic-appearing or diaphoretic.  HENT:     Head: Normocephalic and atraumatic.     Right Ear: External ear normal.     Left Ear: External ear normal.     Nose: Nose normal.     Mouth/Throat:     Pharynx: Oropharynx is clear.  Eyes:     General: No scleral icterus.       Right eye: No discharge.        Left eye: No discharge.     Extraocular Movements: Extraocular movements intact.     Pupils: Pupils are equal, round, and reactive to light.  Cardiovascular:     Rate and Rhythm: Normal rate.  Pulmonary:     Effort: Pulmonary effort is normal.  Musculoskeletal:     Cervical back: Normal range of motion.  Neurological:     Mental Status: She is alert and oriented to person, place, and time.  Psychiatric:  Mood and Affect: Mood normal.        Behavior: Behavior normal.        Thought Content: Thought content normal.        Judgment: Judgment normal.     Results for orders placed or performed during the hospital encounter of 05/17/19 (from the past 24 hour(s))  POCT urinalysis dip (device)     Status: Abnormal   Collection Time: 05/17/19 11:46 AM  Result Value Ref Range   Glucose, UA NEGATIVE NEGATIVE mg/dL   Bilirubin Urine NEGATIVE NEGATIVE   Ketones, ur NEGATIVE NEGATIVE mg/dL   Specific Gravity, Urine 1.025 1.005 - 1.030   Hgb urine dipstick TRACE (A) NEGATIVE   pH 5.5 5.0 - 8.0   Protein, ur NEGATIVE NEGATIVE mg/dL   Urobilinogen, UA 1.0 0.0 - 1.0 mg/dL   Nitrite NEGATIVE NEGATIVE   Leukocytes,Ua TRACE (A) NEGATIVE     Assessment and Plan :   1. Pelvic pain   2. Unprotected sex    Urine culture pending, recommended patient hydrate very well with water and use safe sex practices.  We discussed her results at length.  I reassured her that she has not had positive testing the past 3 times that she is gotten checked.  She has gotten treatment each time.  She was agreeable to holding off on results and making sure we are directing treatment based off of the urine culture, STI testing.  Recommended general supportive care otherwise.  Counseled patient on potential for adverse effects with medications prescribed/recommended today, ER and return-to-clinic precautions discussed, patient verbalized understanding.   Will call results to [phone].   Wallis Bamberg, PA-C 05/17/19 1255

## 2019-05-18 LAB — CYTOLOGY, (ORAL, ANAL, URETHRAL) ANCILLARY ONLY
Chlamydia: NEGATIVE
Neisseria Gonorrhea: NEGATIVE
Trichomonas: NEGATIVE

## 2019-05-18 LAB — URINE CULTURE: Culture: NO GROWTH

## 2019-12-25 ENCOUNTER — Encounter (HOSPITAL_COMMUNITY): Payer: Self-pay

## 2019-12-25 ENCOUNTER — Other Ambulatory Visit: Payer: Self-pay

## 2019-12-25 ENCOUNTER — Ambulatory Visit (HOSPITAL_COMMUNITY)
Admission: EM | Admit: 2019-12-25 | Discharge: 2019-12-25 | Disposition: A | Discharge status: Home / Self Care | Payer: Self-pay | Attending: Internal Medicine | Admitting: Internal Medicine

## 2019-12-25 DIAGNOSIS — Z113 Encounter for screening for infections with a predominantly sexual mode of transmission: Secondary | ICD-10-CM

## 2019-12-25 DIAGNOSIS — R1031 Right lower quadrant pain: Secondary | ICD-10-CM

## 2019-12-25 LAB — HIV ANTIBODY (ROUTINE TESTING W REFLEX): HIV Screen 4th Generation wRfx: NONREACTIVE

## 2019-12-25 MED ORDER — IBUPROFEN 600 MG PO TABS: 600.0000 mg | ORAL_TABLET | Freq: Four times a day (QID) | ORAL | 0 refills | Status: DC | PRN

## 2019-12-25 NOTE — ED Provider Notes (Signed)
[facility]    CSN: [id number] Arrival date & time: 12/25/19  6387      History   Chief Complaint Chief Complaint  Patient presents with  . Abdominal Pain    HPI Frederick Hess is a 34 y.o. adult comes to urgent care with complaint of intermittent right groin pain over the past week.  Patient describes the pain as sharp and shooting.  Pain shoots from lower back into the right groin area.  No known preceding event or aggravating factors.  No known relieving factors.  No swelling in the groin area.  Patient denies any trauma to the right groin area.  No history of low back pain injury.  No history of inguinal hernia.  Patient denies any pain at this time.  Patient would like to be screened for STD.  He has been engaging in unprotected sexual intercourse with 1 partner.  HPI  Past Medical History:  Diagnosis Date  . Hormone replacement therapy   . Transexualism     Patient Active Problem List   Diagnosis Date Noted  . Depression (emotion) 10/30/2013  . 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 Substance abuse (HCC) 10/10/2013  . Borderline personality disorder (HCC) 10/10/2013    Past Surgical History:  Procedure Laterality Date  . COSMETIC SURGERY     rhinoplasty  . RHINOPLASTY      OB History   No obstetric history on file.      Home Medications    Prior to Admission medications   Medication Sig Start Date End Date Taking? Authorizing Provider  aspirin 81 MG chewable tablet Chew 4 tablets (324 mg total) by mouth daily. 01/09/19   Elpidio Anis, PA-C  ibuprofen (ADVIL) 600 MG tablet Take 1 tablet (600 mg total) by mouth every 6 (six) hours as needed. 12/25/19   Randy Whitener, Britta Mccreedy, MD  meloxicam (MOBIC) 15 MG tablet Take 1 tablet daily as needed for back and groin pain.  Discontinue naproxen. 04/14/19   Molpus, John, MD  SUMAtriptan (IMITREX) 100 MG tablet Take 100 mg by mouth 2 (two) times daily as needed  for migraine.  01/06/19   [provider]    Family History Family History  Problem Relation Age of Onset  . Diabetes Mother     Social History Social History   Tobacco Use  . Smoking status: Former Smoker    Packs/day: 1.00  . Smokeless tobacco: Never Used  Vaping Use  . Vaping Use: Never used  Substance Use Topics  . Alcohol use: Not Currently  . Drug use: Not Currently    Types: Marijuana     Allergies   Amoxicillin   Review of Systems Review of Systems  Constitutional: Negative.   Respiratory: Negative.   Gastrointestinal: Negative.   Musculoskeletal: Negative.  Negative for joint swelling and myalgias.  Skin: Negative.   Neurological: Negative.      Physical Exam Triage Vital Signs ED Triage Vitals  Enc Vitals Group     BP 12/25/19 1124 135/87     Pulse Rate 12/25/19 1124 (!) 59     Resp 12/25/19 1124 18     Temp 12/25/19 1124 98 F (36.7 C)     Temp Source 12/25/19 1124 Oral     SpO2 12/25/19 1124 100 %     Weight --      Height --      Head Circumference --      Peak Flow --  Pain Score 12/25/19 1129 5     Pain Loc --      Pain Edu? --      Excl. in GC? --    No data found.  Updated Vital Signs BP 135/87 (BP Location: Left Arm)   Pulse (!) 59   Temp 98 F (36.7 C) (Oral)   Resp 18   SpO2 100%   Visual Acuity Right Eye Distance:   Left Eye Distance:   Bilateral Distance:    Right Eye Near:   Left Eye Near:    Bilateral Near:     Physical Exam Vitals and nursing note reviewed.  Constitutional:      General: She is not in acute distress.    Appearance: She is not ill-appearing.  Cardiovascular:     Rate and Rhythm: Normal rate and regular rhythm.     Pulses: Normal pulses.     Heart sounds: Normal heart sounds.  Pulmonary:     Effort: Pulmonary effort is normal.     Breath sounds: Normal breath sounds.  Abdominal:     General: Bowel sounds are normal.     Palpations: Abdomen is soft.     Comments: Inguinal  orifices without mass.  No masses palpated on coughing.  No tenderness, no guarding or rebound tenderness.  No masses palpated.  Neurological:     Mental Status: She is alert.      UC Treatments / Results  Labs (all labs ordered are listed, but only abnormal results are displayed) Labs Reviewed  HIV ANTIBODY (ROUTINE TESTING W REFLEX)  RPR  CYTOLOGY, (ORAL, ANAL, URETHRAL) ANCILLARY ONLY    EKG   Radiology No results found.  Procedures Procedures (including critical care time)  Medications Ordered in UC Medications - No data to display  Initial Impression / Assessment and Plan / UC Course  I have reviewed the triage vital signs and the nursing notes.  Pertinent labs & imaging results that were available during my care of the patient were reviewed by me and considered in my medical decision making (see chart for details).     1.  Low back pain with right groin pain likely musculoskeletal: Back strengthening exercises Gentle range of motion exercises Heating pad as needed Ibuprofen every 6 hours as needed for pain  2.  STD screening: Safe sex practices recommended HIV/RPR Cytology for GC/chlamydia/trichomonas No treatment is given at this time We will follow her labs and make clinical decisions once lab results are available Return precautions given Patient verbalizes understanding of the plan of care. Final Clinical Impressions(s) / UC Diagnoses   Final diagnoses:  Pain in the groin, right  Screen for STD (sexually transmitted disease)     Discharge Instructions     Gentle range of motion exercises Back strengthening exercises Heating pad If symptoms gets worse please return to the urgent care to be reevaluated Safe sex practices advised.   ED Prescriptions    Medication Sig Dispense Auth. Provider   ibuprofen (ADVIL) 600 MG tablet Take 1 tablet (600 mg total) by mouth every 6 (six) hours as needed. 30 tablet Naren Benally, Britta Mccreedy, MD     PDMP not  reviewed this encounter.   Merrilee Jansky, MD 12/25/19 1316

## 2019-12-25 NOTE — Discharge Instructions (Signed)
Gentle range of motion exercises Back strengthening exercises Heating pad If symptoms gets worse please return to the urgent care to be reevaluated Safe sex practices advised.

## 2019-12-25 NOTE — ED Triage Notes (Signed)
Pt presents with RLQ abdominal pain X 1 week.  

## 2019-12-26 LAB — CYTOLOGY, (ORAL, ANAL, URETHRAL) ANCILLARY ONLY
Chlamydia: NEGATIVE
Comment: NEGATIVE
Comment: NEGATIVE
Comment: NORMAL
Neisseria Gonorrhea: NEGATIVE
Trichomonas: NEGATIVE

## 2019-12-26 LAB — RPR: RPR Ser Ql: NONREACTIVE

## 2020-01-22 ENCOUNTER — Ambulatory Visit: Payer: BLUE CROSS/BLUE SHIELD

## 2020-02-03 ENCOUNTER — Ambulatory Visit: Payer: BLUE CROSS/BLUE SHIELD

## 2020-02-05 ENCOUNTER — Ambulatory Visit: Payer: BLUE CROSS/BLUE SHIELD | Attending: Student in an Organized Health Care Education/Training Program

## 2020-02-05 DIAGNOSIS — Z0000 Encounter for general adult medical examination without abnormal findings: Secondary | ICD-10-CM

## 2020-02-05 DIAGNOSIS — Z7185 Vaccine counseling: Secondary | ICD-10-CM

## 2020-02-05 DIAGNOSIS — Z720 Tobacco use: Secondary | ICD-10-CM

## 2020-02-05 DIAGNOSIS — Z87898 Personal history of other specified conditions: Secondary | ICD-10-CM

## 2020-02-05 DIAGNOSIS — Z23 Encounter for immunization: Secondary | ICD-10-CM

## 2020-02-05 DIAGNOSIS — M542 Cervicalgia: Secondary | ICD-10-CM

## 2020-02-05 DIAGNOSIS — F32A Depression, unspecified depression type: Secondary | ICD-10-CM

## 2020-02-05 MED ORDER — NICOTINE POLACRILEX 4 MG MT GUM
4 mg | ORAL_TABLET | ORAL | 0 refills | Status: AC | PRN
Start: 2020-02-05 — End: ?

## 2020-02-05 MED ORDER — NICOTINE 21 MG/24HR TD PT24
1 | MEDICATED_PATCH | Freq: Every day | TRANSDERMAL | 0 refills | Status: AC
Start: 2020-02-05 — End: ?

## 2020-02-05 NOTE — H&P
[facility] History and Physical    PATIENT:  Nicholas Mcbride  MRN:  [id number]  DOB:  1985-04-15  DATE OF SERVICE:  02/05/2020    PRIMARY CARE PROVIDER: Cruz Condon., MD, MPH    Chief Complaint   Patient presents with   ??? Annual Exam     Frequent headcahes.       Subjective:     Nicholas Mcbride is a 34 y.o. male with history of current smoking, GERD who presents to establish care.    Current smoker, wants to quit  - was taking Chantix in the past, stopped taking it for unclear reasons, he said ''I don't know I just stopped,'' it was working for him, was able to cut down from 0.5 PPD to 2 cigs a week.   - denies having nightmares on it, thinks it was depression  - has also tried patches before and gum but separately.     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, diet just ''nicotine and caffeine and salsa''  - when at home and eating more regular not an issue    Exercise: starting running recently, it's hard but keeping at it    Depression  - screened positive on screen, step-father has cancer, he doesn't get much sleep lately with baby in the house, paternity leave soon so he thinks this will improve  - running has helped  - he doesn't have a bedtime routine, sometimes is on is phone all night before bed  - wife is in therapy for baby blues, wondering about therapy for himself  - has had sleep study done in past, says he got a pulse ox and machine for home  - snores a lot  - dentist says he grinds his teeth too  - gets frequent headaches when he doesn't get much sleep the night before    Neck pain  - stiffness in neck from long drives  - been a long time, years  - has seen a chiropractor in past which helped, would like referral  - ''popping ibuprofen like Skittles,'' massage would help    Past Medical History:   Past Medical History:   Diagnosis Date   ??? GERD (gastroesophageal reflux disease)    ??? Headache ??? Hyperlipidemia    ??? Obesity (BMI 30-39.9)    ??? Prediabetes    ??? Snoring    ??? Tobacco use     hx of being on Chantix, 2019      Past Surgical History:   Past Surgical History:   Procedure Laterality Date   ??? WISDOM TOOTH EXTRACTION       Current Medications:   Current Outpatient Medications   Medication Sig   ??? nicotine 21 mg/24 hr patch Place 1 patch (21 mg total) onto the skin daily Use 21 mg (full patch) daily for 6 weeks. After 6 weeks decrease to 14 mg patch (2/3rds a patch) daily for 2 weeks, then afterwards use 7 mg patch daily (1/3 patch) for 2 weeks.Marland Kitchen   ??? nicotine polacrilex 4 MG gum Take 1 each (4 mg total) by mouth as needed for for Smoking cessation.     No current facility-administered medications for this visit.     Allergies: Patient has no known allergies.    Family History:   Family History   Problem Relation Age of Onset   ??? Diabetes Mother    ???  Stroke Mother 66   ??? Hypertension Mother    ??? Hypothyroidism Mother    ??? Heart attack Maternal Aunt    ??? Hypertension Maternal Aunt    ??? Mental illness Maternal Aunt      Social History:   Social History     Socioeconomic History   ??? Marital status: Married     Spouse name: Not on file   ??? Number of children: 2   ??? Years of education: Not on file   ??? Highest education level: Not on file   Occupational History   ??? Occupation: Naval architect   Tobacco Use   ??? Smoking status: Current Every Day Smoker     Packs/day: 0.50     Types: Cigarettes   ??? Smokeless tobacco: Never Used   ??? Tobacco comment: s/p Chantix, smokes at work   Substance and Sexual Activity   ??? Alcohol use: Not Currently   ??? Drug use: Never   ??? Sexual activity: Yes     Partners: Female     Birth control/protection: Condom      Review of Systems: Pertinent positives and negatives included in HPI    Objective:     BP 128/80  ~ Pulse 65  ~ Temp 36.9 ???C (98.4 ???F) (Oral)  ~ Resp 15  ~ Ht 5' 6'' (1.676 m)  ~ Wt 208 lb 9.6 oz (94.6 kg)  ~ SpO2 98%  ~ BMI 33.67 kg/m???  Body mass index is 33.67 kg/m???.    General appearance: alert, appears stated age, NAD  Head:  normocephalic, atraumatic, without obvious abnormality  Eyes:  lids and conjunctiva grossly normal. PERRL, EOMI  ENMT: external ears without abnormality, dentition fair, MMM, Mallampati Class V, 44 cm neck circumference  Neck: midline trachea, no rigidity, no thyromegaly, no LAD, negative Spurlings, negative tenderness to percussion C spine, full ROM neck, +muscular tension  Cardiovascular;  RRR, normal s1 and s2, no m/r/g.  Respiratory: CTAB, good respiratory effort, no w/r/r  GI:  BS+, soft, NTND, no masses or organomegaly.  Skin: skin color grossly normal, no rashes or lesions on inspection or palpation  Musculoskeletal exam: inspection without abnormality, muscle tone grossly normal, extremities WWP, no LE edema  Neurological exam:  Strength and sensation grossly intact, normal gait  Psychiatric:  A&O, mood and affect appropriate    Patient Latest PHQ-9 Score: 16 (02/05/20 1454)   PHQ-9 Score   Depression Severity     Proposed Treatment Actions    (reference)      0  -  4 None - minimal  ??? None     5  -  9  Mild  ??? Watchful waiting; repeat PHQ-9 at follow-up    10 - 14  Moderate  ??? Treatment plan, considering counseling, follow-up and/or pharmacotherapy    15 - 19  Moderately Severe  ??? Active treatment with pharmacotherapy and/or psychotherapy    20 - 27  Severe  ??? Immediate initiation of pharmacotherapy and, if severe impairment or poor response to therapy, expedited referral to a mental health specialist for psychotherapy and/or collaborative management  GAD-7: 13    Assessment & Plan:   Nicholas Mcbride is a 34 y.o. male current tobacco smoker presenting to establish care.     1. Tobacco use  Current 0.5 PPD smoker, wants to quit. S/p Chantix in past which did not help him.  - discussed dual nicotine replacement therapy method, use patch for baseline and then curb cravings with prn gum  - nicotine 21 mg/24 hr patch; Place 1 patch (21 mg total) onto the skin daily Use 21 mg (full patch) daily for 6 weeks. After 6 weeks decrease to 14 mg patch (2/3rds a patch) daily for 2 weeks, then afterwards use 7 mg patch daily (1/3 patch) for 2 weeks..  Dispense: 45 patch; Refill: 0  - nicotine polacrilex 4 MG gum; Take 1 each (4 mg total) by mouth as needed for for Smoking cessation.  Dispense: 100 tablet; Refill: 0  - follow up 2 months smoking cessation progress    2. Need for influenza vaccination  - Influenza vaccine IM;   PF    3. Vaccine counseling  - highly encouraged COVID-19 vaccination for patient especially since he has two children under age of 5 in household  - highly encouraged Pneumovax given patient's active smoking history  - paper rx given for patient to obtain vaccines at pharmacy    4. Depression, unspecified depression type  PHQ-9 16, GAD-7 13. In discussion with patient, he has significant life stressors including family with illness, work, young children. Wife in therapy and encouraged patient to try, patient amenable to starting therapy.  - Referral to Psych [facility])  - discussed tips and techniques to relax for sleep  - discussed other self-help methods (meditation, quitting smoking, continuing exercise)  - ED precautions for severely worsening depression, SI    5. 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  - Referral to Chiropractic Medicine    6. Routine adult health maintenance  - Lipid Panel  - Hgb A1c - HPLC  - Comprehensive Metabolic Panel  - CBC & Platelet Count    7. History of snoring  STOP-BANG 4. Neck circumference >40 cm, Mallampati V, BMI > 30.   - Sleep Study, Adult; Future  - Referral for Sleep Consultation    #. Routine Health Maintenance:  Health Maintenance   Topic Date Due   ??? COVID-19 Vaccine (1) Never done   ??? Hepatitis C Screening  Never done   ??? Tdap/Td Vaccine (2 - Td or Tdap) 10/15/2026   ??? Influenza Vaccine  Completed   ??? HIV Screening  Completed     Vaccines:  Immunization History   Administered Date(s) Administered   ??? Tdap 10/14/2016   ??? influenza vaccine IM quadrivalent (Fluarix Quad) (PF) SYR (64 months of age and older) 02/05/2020     The above plan of care, diagnosis, orders, and follow-up were discussed with the patient.  Questions related to this recommended plan of care were answered.    Advised to return to clinic in 2 month for video visit to follow up smoking cessation, sooner PRN.    No future appointments.    Discussed with attending physician, Dr. Robyne Peers, who agrees with the assessment and plan above.    Marcus Schwandt A. Regino Schultze, MD, MPH 02/05/2020 2:41 PM

## 2020-02-05 NOTE — Patient Instructions
1. [facility]  ? Free online guided meditations that can be downloaded into iTunes  ??? [email or web address]  ? Online classes and retreats also available    2. Meditation [employer]asis (www.meditationoasis.com)   ? Engineering geologist available for purchase  ? Free podcasts  ??? (http://www.meditationoasis.com/podcast/listen-to-podcast/)     3. AnxietyBC???  (http://hoffman.com/)  ??? [employer]xiety[employer]??? provides a Education officer, community of self-help information and programs, for youth and young adults, adults, new mothers, and parents. Their mission is to increase awareness, promote education and improve access to programs that work.  ??? Creators of the MindShift mobile app (see below)    4. Smartphone Applications    ? MindShift - iTunes and Android (free)   ??? MindShift is an app designed to help individuals cope with anxiety.  ??? Learn ways to relax, develop more helpful ways of thinking, and identify active steps that will help take charge of anxiety.    ? Healthy Star an ebook by Relax Kids (free) ???  iTunes    ? Relax Melodies ??? [employer] (free)  ??? White noise ambience for sleep, meditation, yoga    ? White Noise Lite ??? [employer] (free)  ??? White noise ambience for sleep, meditation, yoga    ? Breathe2Relax ??? [employer] (free)  ??? Teaches diaphragmatic (deep) breathing    ? Relax and Sleep ??? Android (free)  ??? Choose from variety of relaxing sounds    ? CBT-i Coach - iTunes and Android (free)  ??? Designed to help you develop good sleep habits and sleep better.   ??? Features include:  1. Record daily sleep and track insomnia symptom changes  2. Use tools and exercises to quiet your mind  3. Learn about sleep and the benefits of sleep hygiene   4. Set reminder messages with tips, motivation and alarms to change sleep habits.    ---------------------------------------     Dear Fransisca Connors,     You have been referred by your primary care physician to [facility] ([facility]), which is a program designed to support your emotional well-being so that you can thrive. [facility] works in close collaboration with your primary care physician to coordinate your treatment plan. We have accessible locations throughout the greater [facility] area.  All referrals are then reviewed by our clinical care team to determine the most appropriate treatment program. If [facility] is not best suited for your mental health needs, we will assist in linking you to other resources to better serve you. You may able to work with a therapist and/or psychiatrist conveniently through a web-based secure video interface, when available. We also verify your insurance to allow you to take advantage of your mental health coverage.      There are three main components of treatment in [facility] you might have been referred to by your PCP:  ??? Psychiatry: you will be evaluated for possible medication to treat your symptoms, with return back to your PCP when showing improvement  ??? Brief individual (or couples/family) psychotherapy: you will participate in short-term therapy (maximum of 12 visits after initial appointment), to work on goals and discuss coping strategies. If you feel you need a long-term provider, please ask for a referral to the [facility] when calling to schedule your first appointment.  ??? Group psychotherapy: you are able to decrease your isolation and meet with a professional facilitator in a group with other individuals who may struggle  with similar symptoms.    Please call [phone] to initiate the scheduling process.     Best Regards,     [facility]  [email or web address]

## 2020-02-06 LAB — Comprehensive Metabolic Panel
BILIRUBIN,TOTAL: <0.2 mg/dL (ref 0.1–1.2)
SODIUM: 139 mmol/L (ref 135–146)

## 2020-02-06 LAB — Lipid Panel: CHOLESTEROL, HDL: 28 mg/dL — ABNORMAL LOW (ref >40)

## 2020-02-06 LAB — CBC: ABSOLUTE NUCLEATED RBC COUNT: 0 10*3/uL (ref 0.00–0.00)

## 2020-02-06 LAB — Hgb A1c: HGB A1C - HPLC: 6.3 — ABNORMAL HIGH (ref <5.7)

## 2020-02-07 ENCOUNTER — Telehealth: Payer: BLUE CROSS/BLUE SHIELD

## 2020-02-07 NOTE — Telephone Encounter
Called patient in regards to 2 month f/u appointment.    No answer, LVM stating appointment details.    Provided callback number in case pt would like to CX/ reschedule.

## 2020-02-07 NOTE — Telephone Encounter
-----   Message from Gratz A. Regino Schultze, MD, MPH sent at 02/07/2020 11:15 AM PST -----  Regarding: follow up appt  Hello,    Can we reach out to this appt to make their 2 month video appt? I wanted it to be made for him before he left Monday, not sure what happened.    Thank you!    Jessica A. Regino Schultze, MD, MPH  Resident Physician, [facility]

## 2020-02-15 NOTE — Progress Notes
FM Attending Addendum:    I discussed the patient's case in detail with Dr. Wang at the time of the patient's visit. I am in agreement with the findings, assessments and plans as stated in this document and as we formulated together.    -Riniyah Speich L. Belvin Gauss, MD

## 2020-02-19 ENCOUNTER — Ambulatory Visit: Payer: BLUE CROSS/BLUE SHIELD

## 2020-02-19 DIAGNOSIS — G479 Sleep disorder, unspecified: Secondary | ICD-10-CM

## 2020-02-19 DIAGNOSIS — R29818 Other symptoms and signs involving the nervous system: Secondary | ICD-10-CM

## 2020-02-19 NOTE — Progress Notes
SLEEP MEDICINE CONSULTATION    PCP: Cruz Condon., MD, MPH  Consulting Provider: Marcos Eke., MD     Chief Complaint:   Chief Complaint   Patient presents with   ??? New Consult     snore       HPI:  Nicholas Mcbride is a 34 y.o. male who has a past medical history of Common migraine, GERD (gastroesophageal reflux disease), Headache, Hyperlipidemia, Obesity (BMI 30-39.9), Prediabetes, Snoring, and Tobacco use.  He is presenting today for an initial consult with concern about their sleep.     He reports having a hx of snoring and daytime sleepiness.  He reports undergoing a home sleep testing a few years ago, but was never told the results.  He has been told he gasps during his sleep and will wake up several times a night.    His sleep patterns varies drastically with work.  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      He at present lives in a one bedroom living situation with wife and children.  With space being tight he generally sleeps on the floor which at times is uncomfortable making it harder to sleep.  He has a new born, which along with everyone in the same shared space, makes sleep a challenge at times.     Sleep ROS:   Patient denies symptoms suggestive of RLS, hypnagogic/hypnopompic hallucinations, sleep paralysis, symptoms of cataplexy, parasomnias, dream enactment, or bruxism.     Sleep Schedule:  Usual bedtime: varies based on work  Reported sleep latency: can take up to an hour due to sleep disruption from environmental and living situation factors.  Nocturnal awakenings: several  Wake up time: 1:00-7:00  Refreshed on awakening?: No  Sleep position: Sides     Past ENT Surgeries:  denies     Family Sleep History:  No known family sleep disorders        Past Medical History:   Diagnosis Date   ??? Common migraine    ??? GERD (gastroesophageal reflux disease)    ??? Headache    ??? Hyperlipidemia    ??? Obesity (BMI 30-39.9)    ??? Prediabetes    ??? Snoring    ??? Tobacco use     hx of being on Chantix, 2019     Past Surgical History:   Procedure Laterality Date   ??? WISDOM TOOTH EXTRACTION       Social History     Tobacco Use   ??? Smoking status: Current Every Day Smoker     Packs/day: 0.50     Types: Cigarettes   ??? Smokeless tobacco: Never Used   ??? Tobacco comment: s/p Chantix, smokes at work   Substance Use Topics   ??? Alcohol use: Not Currently   ??? Drug use: Never     Family History   Problem Relation Age of Onset   ??? Diabetes Mother    ??? Stroke Mother 64   ??? Hypertension Mother    ??? Hypothyroidism Mother    ??? Heart attack Mother    ??? Heart attack Maternal Aunt    ??? Hypertension Maternal Aunt    ??? Mental illness Maternal Aunt      No Known Allergies  Outpatient Medications Prior to Visit   Medication Sig   ??? nicotine 21 mg/24 hr patch Place 1 patch (21 mg total) onto the skin daily Use 21 mg (full patch)  daily for 6 weeks. After 6 weeks decrease to 14 mg patch (2/3rds a patch) daily for 2 weeks, then afterwards use 7 mg patch daily (1/3 patch) for 2 weeks.. (Patient not taking: Reported on 02/19/2020.)   ??? nicotine polacrilex 4 MG gum Take 1 each (4 mg total) by mouth as needed for for Smoking cessation. (Patient not taking: Reported on 02/19/2020.)     No facility-administered medications prior to visit.     PHYSICAL EXAM:  BP 128/79  ~ Pulse 66  ~ Temp 36.7 ???C (98 ???F) (Tympanic)  ~ Ht 5' 9.45'' (1.764 m)  ~ Wt 211 lb (95.7 kg)  ~ SpO2 98%  ~ BMI 30.76 kg/m???   Gen: NAD, alert and oriented   Head: Normocephalic and atraumatic  Eyes: PERRLA, EOMI   Nose: No septal deviation, no polyps noted, turbinates mildly boggy  Mouth/airway: Friedman IV, tonsil Grade 1, mild overjet, narrow tonsillar pillars, MMM, normal dentition, no retrognathia, +tongue scalloping   Neck: no noted JVD or masses, trachea midline    Respiratory: CTA b/l, no rhonchi, wheezes, or crackles, normal effort, no obvious chest deformities   CV: +S1,+S2, RRR, no M/R/G  Neuro: speech fluent, no tremor, CN grossly intact, no focal deficits noted   Psych: appropriate affect, mood stable   MSK: normal gait, ROM grossly intact   Skin/Ext: no signs of cyanosis, clubbing or edema noted     LABS:     Lab Results   Component Value Date    NA 139 02/05/2020    K 4.1 02/05/2020    CL 104 02/05/2020    CO2 24 02/05/2020    BUN 17 02/05/2020    CREAT 0.98 02/05/2020    GLUCOSE 90 02/05/2020    CALCIUM 9.3 02/05/2020    ALT 61 02/05/2020    AST 31 02/05/2020    ALKPHOS 84 02/05/2020    BILITOT <0.2 02/05/2020     Lab Results   Component Value Date    HGBA1C 6.3 (H) 02/05/2020        ASSESSMENT AND PLAN:  34 y.o. male with signs and symptoms concerning for obstructive sleep apnea.   Also has sleep disruption due to currently family living situation and variable work schedule.     -Discussed the pathophysiology of obstructive sleep apnea (OSA) along with the health risks associated with untreated obstructive sleep apnea which include hypertension, pulmonary hypertension, coronary artery disease, cardiac dysrhythmia, stroke, insulin resistance, as well as the potential to exacerbate underlying mood disorders and increase risk of motor vehicle accidents.    -Has PSG ordered and approved via PCP.  Discussed with patient and given number to schedule.  Follow up after study to discuss results and continue with management.    -Recommend weight loss, which in addition to other health benefits, may improve sleep disordered breathing.     -Avoid supine sleep if possible.    -Avoid alcohol, opioids, and hypnotics which can worsen sleep disordered breathing.    -Avoid driving or performing tasks requiring focus if sleepy.    -Follow up 1-2 weeks after sleep study.  Patient encouraged to call office or message through MyChart with any questions or concerns.    Chapman Fitch, MD, FAASM    Diplomate of Sleep Medicine  Clinical Instructor [employer] at [facility] of Pulmonary, Critical Care, and Sleep Medicine    [facility]  Phone: [phone]  Fax: [phone]  [address], [address]  [place]  [place], [place] [address]    Total time spent on patient encounter, including reviewing medical chart/records, obtaining history and physical exam, patient education/counseling, placing medical orders, documentation of visit, and coordination of care:  (40 minutes)

## 2020-02-26 ENCOUNTER — Ambulatory Visit: Payer: BLUE CROSS/BLUE SHIELD

## 2020-03-09 ENCOUNTER — Ambulatory Visit: Payer: BLUE CROSS/BLUE SHIELD

## 2020-03-21 ENCOUNTER — Ambulatory Visit: Payer: BLUE CROSS/BLUE SHIELD

## 2020-03-29 ENCOUNTER — Other Ambulatory Visit: Payer: Self-pay

## 2020-03-29 ENCOUNTER — Ambulatory Visit (HOSPITAL_COMMUNITY)
Admission: EM | Admit: 2020-03-29 | Discharge: 2020-03-29 | Disposition: A | Discharge status: Home / Self Care | Payer: Self-pay

## 2020-03-29 ENCOUNTER — Encounter (HOSPITAL_COMMUNITY): Payer: Self-pay | Admitting: Emergency Medicine

## 2020-03-29 DIAGNOSIS — Z7689 Persons encountering health services in other specified circumstances: Secondary | ICD-10-CM

## 2020-03-29 NOTE — ED Triage Notes (Signed)
called from front lobby ,no answer.

## 2020-03-29 NOTE — ED Provider Notes (Signed)
[facility]    CSN: [id number] Arrival date & time: 03/29/20  1229      History   Chief Complaint Chief Complaint  Patient presents with  . Letter for School/Work    HPI Frederick Hess is a 35 y.o. adult.   Frederick Hess presents with requests for evaluation and note in order to return to work. He tested positive for covid-19 by at home antigen testing nearly 14 days ago. Originally with fever/ha/ URI symptoms. Symptoms have since resolved. Has taken additional at-home antigen testing which has been negative. Did not take any PCR testing and had not been seen for his symptoms. Feels much improved, but has missed work. No shortness of breath . No chest pain.  No fevers. No headache.     ROS per HPI, negative if not otherwise mentioned.      Past Medical History:  Diagnosis Date  . Hormone replacement therapy   . Transexualism     Patient Active Problem List   Diagnosis Date Noted  . Depression (emotion) 10/30/2013  . 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 Substance abuse ([place]) 10/10/2013  . Borderline personality disorder ([place]) 10/10/2013    Past Surgical History:  Procedure Laterality Date  . COSMETIC SURGERY     rhinoplasty  . RHINOPLASTY      OB History   No obstetric history on file.      Home Medications    Prior to Admission medications   Medication Sig Start Date End Date Taking? Authorizing Provider  aspirin 81 MG chewable tablet Chew 4 tablets (324 mg total) by mouth daily. 01/09/19   Charlann Lange, PA-C  ibuprofen (ADVIL) 600 MG tablet Take 1 tablet (600 mg total) by mouth every 6 (six) hours as needed. 12/25/19   Lamptey, Myrene Galas, MD  meloxicam (MOBIC) 15 MG tablet Take 1 tablet daily as needed for back and groin pain.  Discontinue naproxen. 04/14/19   Molpus, John, MD  SUMAtriptan (IMITREX) 100 MG tablet Take 100 mg by mouth 2 (two) times daily as needed for migraine.  01/06/19    [provider]    Family History Family History  Problem Relation Age of Onset  . Diabetes Mother     Social History Social History   Tobacco Use  . Smoking status: Former Smoker    Packs/day: 1.00  . Smokeless tobacco: Never Used  Vaping Use  . Vaping Use: Never used  Substance Use Topics  . Alcohol use: Not Currently  . Drug use: Not Currently    Types: Marijuana     Allergies   Amoxicillin   Review of Systems Review of Systems   Physical Exam Triage Vital Signs ED Triage Vitals  Enc Vitals Group     BP 03/29/20 1413 129/89     Pulse Rate 03/29/20 1413 67     Resp --      Temp 03/29/20 1413 97.8 F (36.6 C)     Temp Source 03/29/20 1413 Oral     SpO2 03/29/20 1413 97 %     Weight 03/29/20 1411 195 lb (88.5 kg)     Height 03/29/20 1411 '6\' 1"'  (1.854 m)     Head Circumference --      Peak Flow --      Pain Score 03/29/20 1411 0     Pain Loc --      Pain Edu? --      Excl. in [place]? --  No data found.  Updated Vital Signs BP 129/89 (BP Location: Right Arm)   Pulse 67   Temp 97.8 F (36.6 C) (Oral)   Ht '6\' 1"'  (1.854 m)   Wt 195 lb (88.5 kg)   SpO2 97%   BMI 25.73 kg/m   Visual Acuity Right Eye Distance:   Left Eye Distance:   Bilateral Distance:    Right Eye Near:   Left Eye Near:    Bilateral Near:     Physical Exam Constitutional:      General: She is not in acute distress.    Appearance: She is well-developed.  Cardiovascular:     Rate and Rhythm: Normal rate.  Pulmonary:     Effort: Pulmonary effort is normal.  Skin:    General: Skin is warm and dry.  Neurological:     Mental Status: She is alert and oriented to person, place, and time.      UC Treatments / Results  Labs (all labs ordered are listed, but only abnormal results are displayed) Labs Reviewed - No data to display  EKG   Radiology No results found.  Procedures Procedures (including critical care time)  Medications Ordered in UC Medications -  No data to display  Initial Impression / Assessment and Plan / UC Course  I have reviewed the triage vital signs and the nursing notes.  Pertinent labs & imaging results that were available during my care of the patient were reviewed by me and considered in my medical decision making (see chart for details).     Has completed appropriate period of time of isolation, symptoms have resolved and no further fevers. Non toxic. Benign physical exam.  Ok to return to work.  Final Clinical Impressions(s) / UC Diagnoses   Final diagnoses:  Return to work exam     Discharge Instructions     You have met criteria as provided by CDC and are ok to return to work.    ED Prescriptions    None     PDMP not reviewed this encounter.   Zigmund Gottron, NP 03/29/20 1541

## 2020-03-29 NOTE — ED Triage Notes (Signed)
Patient presents for work note for job due to previous COVID positive test.   Patient states they had a positive COVID test 2 weeks ago.

## 2020-03-29 NOTE — Discharge Instructions (Signed)
You have met criteria as provided by CDC and are ok to return to work.

## 2020-04-11 ENCOUNTER — Telehealth: Payer: BLUE CROSS/BLUE SHIELD

## 2020-04-11 ENCOUNTER — Telehealth: Payer: BLUE CROSS/BLUE SHIELD | Attending: Student in an Organized Health Care Education/Training Program

## 2020-04-11 DIAGNOSIS — F32A Depression, unspecified depression type: Secondary | ICD-10-CM

## 2020-04-11 DIAGNOSIS — Z716 Tobacco abuse counseling: Secondary | ICD-10-CM

## 2020-04-11 DIAGNOSIS — I10 Essential (primary) hypertension: Secondary | ICD-10-CM

## 2020-04-11 NOTE — Progress Notes
Patient Consent to Telehealth Questionnaire   No flowsheet data found.  - I agree  to be treated via a video visit and acknowledge that I may be liable for any relevant copays or coinsurance depending on my insurance plan.  - I understand that this video visit is offered for my convenience and I am able to cancel and reschedule for an in-person appointment if I desire.  - I also acknowledge that sensitive medical information may be discussed during this video visit appointment and that it is my responsibility to locate myself in a location that ensures privacy to my own level of comfort.  - I also acknowledge that I should not be participating in a video visit in a way that could cause danger to myself or to those around me (such as driving or walking).  If my provider is concerned about my safety, I understand that they have the right to terminate the visit.     [facility]  Telephone Visit Encounter Note:    The following encounter was conducted via the HIPAA-compliant [place] teleconference video visit platform. This patient and/or their legal representative has completed the pre-visit consent and has consented to this video visit before and at the initiation of the actual encounter. This visit was converted to telephone because patient could not navigate Immunologist. Consented to receive care by phone, confirmed identity x 2.    PATIENT: Nicholas Mcbride  MRN: [id number]  DOB: October 18, 1985  DATE OF SERVICE: 04/11/2020    REFERRING PRACTITIONER: No ref. provider found  PRIMARY CARE PROVIDER: Cruz Condon., MD, MPH    REASON FOR REFERRAL: follow up     Subjective:   Nicholas Mcbride is a 35 y.o. male with history of probable OSA, BMI 30.8, GERD, HLD, preDM, tobacco use presenting for follow up.    Saw sleep, patient was upset they wanted to charge him $400 for consultation after insurance. Didn't get sleep study done because he was worried about the cost. Super upset, said he ''would rather die than spend the money!''    Stressful right now because he is on ''baby-bonding time.'' Just got the nicotine gum, since he got the gum he hasn't to smoke. Hasn't had to start patches yet, saving them for when he goes on drives again. Last smoke was 2-3 weeks ago.     Hasn't gotten flu shot yet, wants to.  Still thinking about COVID-19 shot. Had no additional questions, understands he would also be protecting new baby this way.    Still not established with mental health because also same reason, worried with cost of getting therapy. Mood still so-so, down and depressed so and so, especially when he gets nicotine craves. PHQ-9 done today by phone.                                                                                          Patient Active Problem List   Diagnosis   ??? Body mass index 30.0-30.9, adult   ??? Gastroesophageal reflux disease   ??? Hyperlipidemia LDL goal <100   ??? Pre-diabetes   ??? Snoring   ??? Tobacco use   ,  Past Medical History:   Diagnosis Date   ??? Common migraine    ??? GERD (gastroesophageal reflux disease)    ??? Headache    ??? Hyperlipidemia    ??? Obesity (BMI 30-39.9)    ??? Prediabetes    ??? Snoring    ??? Tobacco use     hx of being on Chantix, 2019   ,   Past Surgical History:   Procedure Laterality Date   ??? WISDOM TOOTH EXTRACTION     ,   Family History   Problem Relation Age of Onset   ??? Diabetes Mother    ??? Stroke Mother 3   ??? Hypertension Mother    ??? Hypothyroidism Mother    ??? Heart attack Mother    ??? Heart attack Maternal Aunt    ??? Hypertension Maternal Aunt    ??? Mental illness Maternal Aunt    ,   Social History     Socioeconomic History   ??? Marital status: Married     Spouse name: Not on file   ??? Number of children: 2   ??? Years of education: Not on file   ??? Highest education level: Not on file   Occupational History   ??? Occupation: Naval architect   Tobacco Use   ??? Smoking status: Current Every Day Smoker     Packs/day: 0.50     Types: Cigarettes   ??? Smokeless tobacco: Never Used   ??? Tobacco comment: s/p Chantix, smokes at work   Substance and Sexual Activity   ??? Alcohol use: Not Currently   ??? Drug use: Never   ??? Sexual activity: Yes     Partners: Female     Birth control/protection: Condom   Other Topics Concern   ??? Do you exercise at least a day, 3 or more days a week? No   ??? Types of Exercise? (List in Comments) No   ??? Do you follow a special diet? No   ??? Vegan? No   ??? Vegetarian? No   ??? Pescatarian? No   ??? Lactose Free? No   ??? Gluten Free? No   ??? Omnivore? No   Social History Narrative   ??? Not on file     Social Determinants of Health     Physical Activity: Not on file   Stress: Not on file   Financial Resource Strain: Not on file   ,   Outpatient Medications Prior to Visit   Medication Sig   ??? [employer] NICOTINE POLACRILEX 4 MG gum    ??? nicotine 21 mg/24 hr patch      No facility-administered medications prior to visit.    and No Known Allergies    Review of Systems:     A 14-system review of systems was performed and is negative except as stated in the history of present illness.     Objective:     Physical Exam: Limited Phone Exam, voice fluent, intermittent cooing of baby in the background.   Provide Feedback about this BPA  Patient Latest PHQ-9 Score: 8 (04/11/20 1338)   PHQ-9 Score   Depression Severity     Proposed Treatment Actions    (reference)      0  -  4 None - minimal  ??? None     5  -  9  Mild  ??? Watchful waiting; repeat PHQ-9 at follow-up    10 - 14  Moderate  ??? Treatment plan, considering counseling, follow-up and/or pharmacotherapy  15 - 19  Moderately Severe  ??? Active treatment with pharmacotherapy and/or psychotherapy    20 - 27  Severe  ??? Immediate initiation of pharmacotherapy and, if severe impairment or poor response to therapy, expedited referral to a mental health specialist for psychotherapy and/or collaborative management                                                                                              Recent  Labs:        Latest known visit with results is:   Office Visit on 02/05/2020   Component Date Value   ??? Cholesterol 02/05/2020 273    ??? Cholesterol,LDL,Calc 02/05/2020     ??? Cholesterol, HDL 02/05/2020 28*   ??? Triglycerides 02/05/2020 525*   ??? Non-HDL,Chol,Calc 02/05/2020 245*   ??? Hgb A1c - HPLC 02/05/2020 6.3*   ??? Sodium 02/05/2020 139    ??? Potassium 02/05/2020 4.1    ??? Chloride 02/05/2020 104    ??? Total CO2 02/05/2020 24    ??? Anion Gap 02/05/2020 11    ??? Glucose 02/05/2020 90    ??? Creatinine 02/05/2020 0.98    ??? GFR Estimate for African* 02/05/2020 >89    ??? GFR Estimate for Non-Afr* 02/05/2020 >89    ??? GFR Additional Informati* 02/05/2020 See Comment    ??? Urea Nitrogen 02/05/2020 17    ??? Calcium 02/05/2020 9.3    ??? Total Protein 02/05/2020 7.5    ??? Albumin 02/05/2020 4.4    ??? Bilirubin,Total 02/05/2020 <0.2    ??? Alkaline Phosphatase 02/05/2020 84    ??? Aspartate Aminotransfera* 02/05/2020 31    ??? Alanine Aminotransferase 02/05/2020 61    ??? White Blood Cell Count 02/05/2020 10.67*   ??? Red Blood Cell Count 02/05/2020 4.89    ??? Hemoglobin 02/05/2020 14.5    ??? Hematocrit 02/05/2020 44.4    ??? Mean Corpuscular Volume 02/05/2020 90.8    ??? Mean Corpuscular Hemoglo* 02/05/2020 29.7    ??? MCH Concentration 02/05/2020 32.7    ??? Red Cell Distribution Wi* 02/05/2020 43.0    ??? Red Cell Distribution Wi* 02/05/2020 13.0    ??? Platelet Count, Auto 02/05/2020 321    ??? Mean Platelet Volume 02/05/2020 11.2    ??? Nucleated RBC%, automated 02/05/2020 0.0    ??? Absolute Nucleated RBC C* 02/05/2020 0.00         Assessment & Plan:   Dennison Mcdaid is a 35 y.o. male with PMHx probable OSA, BMI 30.8, GERD, HLD, preDM, tobacco use presenting for follow up.    1. Encounter for smoking cessation counseling  - congratulated on cessation efforts thus far  - opted to have follow up in 1 month to discuss how smoking cessation is going when he is back to work, will try using both patch and gum then to avoid re-starting  - at next visit consider Wellbutrin if worsening, can also dual-help with depression    2. Hypertension, unspecified type  - assess at next visit  - needs to have sleep study to assess probable OSA, patient concerned about cost $, will give in  AVS name of test being done so he can call insurance to see how much will cost him, but has good insurance should cover most of it    3. Depression, unspecified depression type  - PHQ-9 of 8 today, better compared to 16 prior visit, currently on paternity leave and spending full-time with baby  - endorsed prior mental health therapy referral, again he is concerned about costs. Phone # given in AVS.  - at next visit consider Wellbutrin if worsening, can also dual-help with smoking cessation    The above plan of care, diagnosis, orders, and follow-up were discussed with the patient.  Questions related to this recommended plan of care were answered.    Follow up: 1 month, consider recheck lipids at that time     I spent a total of 25 minutes in the care of this patient and completion of this encounter on this date.  Topics of my discussion are in my note.     Issa Luster A. Regino Schultze, MD, MPH  Resident Physician, [facility]    [facility] [place] [facility]

## 2020-04-11 NOTE — Patient Instructions
Phone number to schedule the sleep study: [phone]  For insurance billing questions: ''Test & Treat for OSA (Baseline + PAP = Split Night) -95811''

## 2020-04-18 NOTE — Progress Notes
Attending Attestation Note  I reviewed the history and physical findings as detailed in the resident's note.  I discussed the management with the resident and agree with the plan of care in the resident note.  1. Encounter for smoking cessation counseling    2. Hypertension, unspecified type    3. Depression, unspecified depression type       Patient motivated to stop smoking.  Cessation techniques disc'd  Further eval and Rx for BP control  F/U to consider meds for depression    Followup as per resident's note.    Maia Petties, MD 04/18/2020 1:21 PM  Family Medicine

## 2020-04-23 ENCOUNTER — Ambulatory Visit: Payer: BLUE CROSS/BLUE SHIELD

## 2020-06-03 ENCOUNTER — Ambulatory Visit: Payer: BLUE CROSS/BLUE SHIELD | Attending: Student in an Organized Health Care Education/Training Program

## 2020-09-12 ENCOUNTER — Encounter (HOSPITAL_BASED_OUTPATIENT_CLINIC_OR_DEPARTMENT_OTHER)

## 2020-09-12 ENCOUNTER — Emergency Department
Admission: EM | Admit: 2020-09-12 | Discharge: 2020-09-12 | Discharge status: Against Medical Advice | Source: Home / Self Care

## 2020-09-12 NOTE — ED Notes (Signed)
 Despite encouragement to stay, patient elected to leave prior to any further medical treatment. Patient able to ambulate without difficulty.      Cecille Aver, RN  09/12/20 [phone]

## 2020-09-12 NOTE — ED Triage Notes (Signed)
 Patient was found at [facility] after overdosing on unknown substance. Patient was given narcan. Patient is alert and uncooperative.

## 2020-11-20 IMAGING — CT CT ANGIO HEAD
2 of 7 series · 8 of 33 positions shown · IV contrast (omnipaque)
Comparison: None.

CLINICAL DATA: Occlusion of left internal carotid artery suspected
based on MRI findings.

EXAM:
CT ANGIOGRAPHY HEAD AND NECK
TECHNIQUE: Multidetector CT imaging of the head and neck was performed using
the standard protocol during bolus administration of intravenous
contrast. Multiplanar CT image reconstructions and MIPs were
obtained to evaluate the vascular anatomy. Carotid stenosis
measurements (when applicable) are obtained utilizing NASCET
criteria, using the distal internal carotid diameter as the
denominator.
CONTRAST:  75mL OMNIPAQUE IOHEXOL 350 MG/ML SOLN

[Series 5: cta neck · axial · 0.45mm/px · z∈[-233,-115]mm · 2 of 179 slices shown]
[im 60/179  soft-tissue]
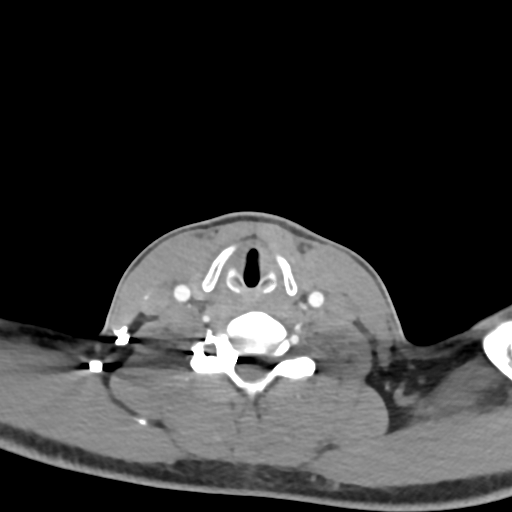
[im 119/179  soft-tissue]
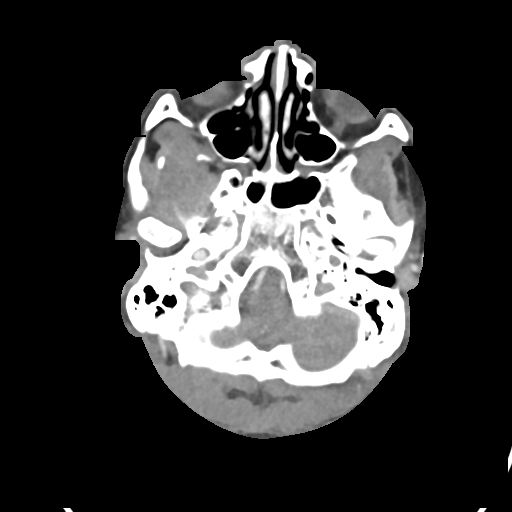

[Series 7: cta neck axial · axial · 0.39mm/px · z∈[-299,-43]mm · 6 of 360 slices shown]
[im 52/360  soft-tissue]
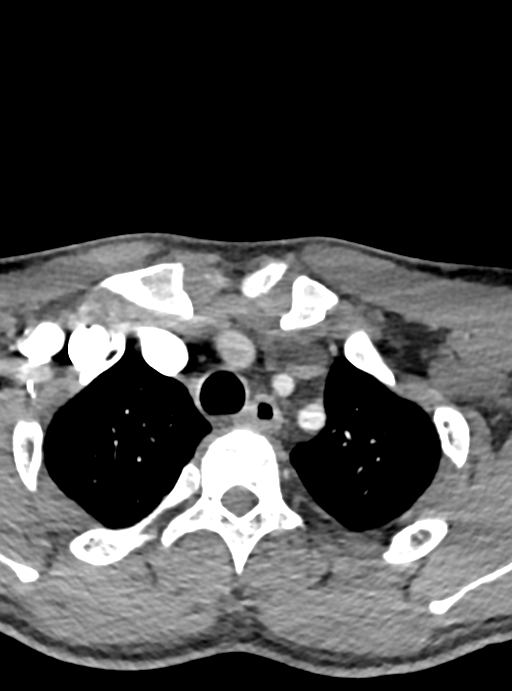
[im 103/360  bone]
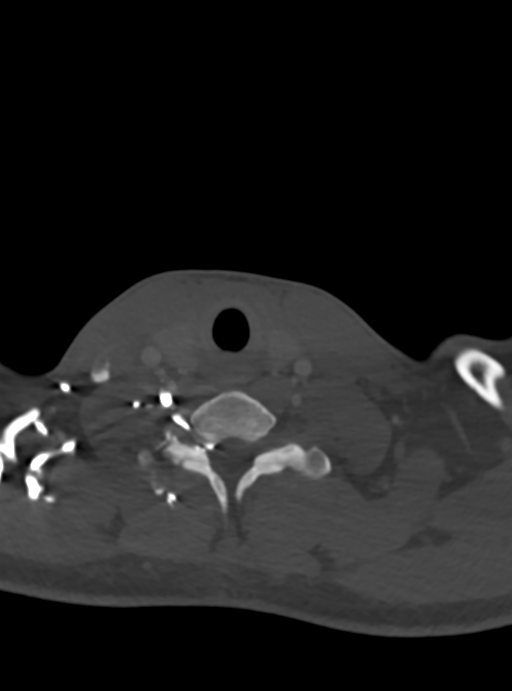
[im 154/360  soft-tissue]
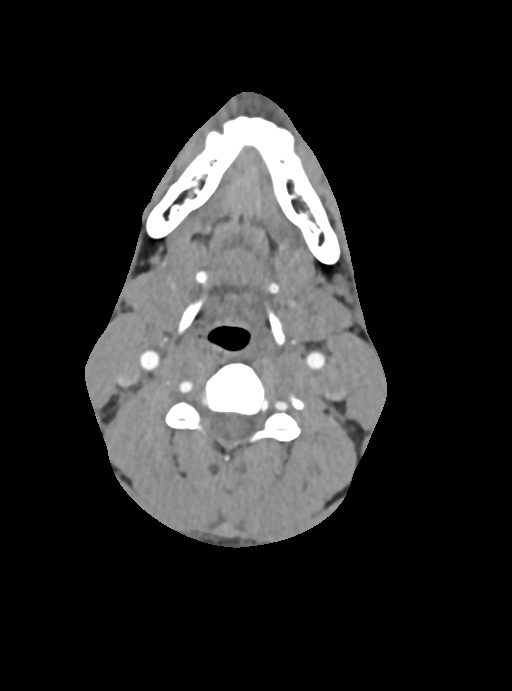
[im 206/360  bone]
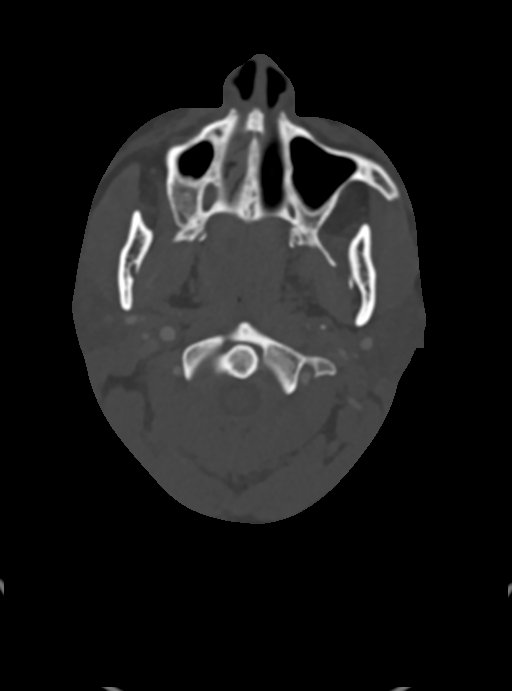
[im 257/360  soft-tissue]
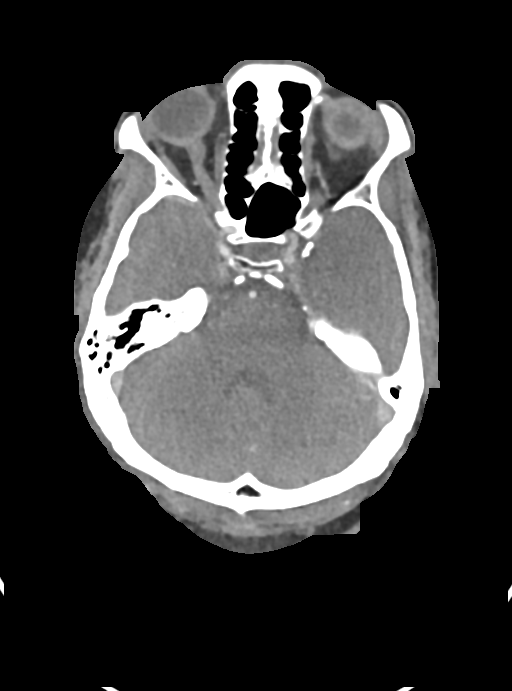
[im 308/360  bone]
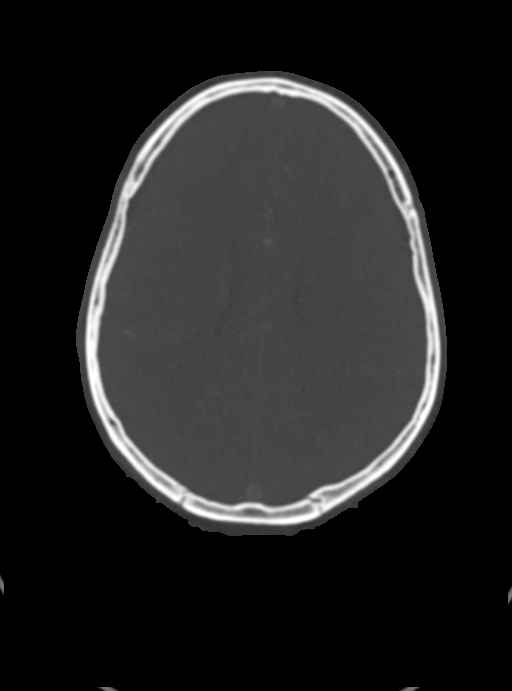

[8 of 33 positions shown; findings below may reference images not displayed]

FINDINGS: CTA NECK FINDINGS

SKELETON: There is no bony spinal canal stenosis. No lytic or
blastic lesion.

OTHER NECK: Normal pharynx, larynx and major salivary glands. No
cervical lymphadenopathy. Unremarkable thyroid gland.

UPPER CHEST: No pneumothorax or pleural effusion. No nodules or
masses.

AORTIC ARCH:

There is no calcific atherosclerosis of the aortic arch. There is no
aneurysm, dissection or hemodynamically significant stenosis of the
visualized portion of the aorta. Conventional 3 vessel aortic
branching pattern. The visualized proximal subclavian arteries are
widely patent.

RIGHT CAROTID SYSTEM: Normal without aneurysm, dissection or
stenosis.

LEFT CAROTID SYSTEM: The left internal carotid artery is occluded 16
mm beyond the bifurcation. There is an area of tapering just
proximal to the occlusion. The remainder of the cervical ICA is
occluded with return of enhancement at the distal cavernous segment.

VERTEBRAL ARTERIES: Codominant configuration. Both origins are
clearly patent. There is no dissection, occlusion or flow-limiting
stenosis to the skull base (V1-V3 segments).

CTA HEAD FINDINGS

POSTERIOR CIRCULATION:

--Vertebral arteries: Normal V4 segments.

--Posterior inferior cerebellar arteries (PICA): Patent origins from
the vertebral arteries.

--Anterior inferior cerebellar arteries (AICA): Patent origins from
the basilar artery.

--Basilar artery: Normal.

--Superior cerebellar arteries: Normal.

--Posterior cerebral arteries: Normal. Both originate from the
basilar artery. Posterior communicating arteries (p-comm) are
diminutive or absent.

ANTERIOR CIRCULATION:

--Intracranial internal carotid arteries: Normal.

--Anterior cerebral arteries (ACA): Normal. Both A1 segments are
present. Patent anterior communicating artery (a-comm).

--Middle cerebral arteries (MCA): Normal.

VENOUS SINUSES: As permitted by contrast timing, patent.

ANATOMIC VARIANTS: None

Review of the MIP images confirms the above findings.
IMPRESSION: 1. Confirmation of occlusion of left internal carotid artery,
beginning approximately 16 mm beyond the left carotid bifurcation.
This likely indicates internal carotid artery dissection.
2. No intracranial arterial occlusion.

## 2020-12-25 ENCOUNTER — Ambulatory Visit: Payer: BLUE CROSS/BLUE SHIELD

## 2021-05-07 ENCOUNTER — Other Ambulatory Visit: Payer: Self-pay

## 2021-05-07 ENCOUNTER — Encounter (HOSPITAL_COMMUNITY): Payer: Self-pay | Admitting: Emergency Medicine

## 2021-05-07 ENCOUNTER — Ambulatory Visit (HOSPITAL_COMMUNITY)
Admission: EM | Admit: 2021-05-07 | Discharge: 2021-05-07 | Disposition: A | Discharge status: Home / Self Care | Payer: BC Managed Care – PPO | Attending: Urgent Care | Admitting: Urgent Care

## 2021-05-07 DIAGNOSIS — R590 Localized enlarged lymph nodes: Secondary | ICD-10-CM | POA: Diagnosis not present

## 2021-05-07 DIAGNOSIS — Z202 Contact with and (suspected) exposure to infections with a predominantly sexual mode of transmission: Secondary | ICD-10-CM | POA: Diagnosis not present

## 2021-05-07 DIAGNOSIS — R102 Pelvic and perineal pain: Secondary | ICD-10-CM | POA: Diagnosis not present

## 2021-05-07 DIAGNOSIS — R350 Frequency of micturition: Secondary | ICD-10-CM | POA: Diagnosis not present

## 2021-05-07 LAB — POCT URINALYSIS DIPSTICK, ED / UC
Bilirubin Urine: NEGATIVE
Glucose, UA: NEGATIVE mg/dL
Hgb urine dipstick: NEGATIVE
Ketones, ur: NEGATIVE mg/dL
Nitrite: NEGATIVE
Protein, ur: NEGATIVE mg/dL
Specific Gravity, Urine: 1.015 (ref 1.005–1.030)
Urobilinogen, UA: 0.2 mg/dL (ref 0.0–1.0)
pH: 5.5 (ref 5.0–8.0)

## 2021-05-07 LAB — HIV ANTIBODY (ROUTINE TESTING W REFLEX): HIV Screen 4th Generation wRfx: NONREACTIVE

## 2021-05-07 MED ORDER — DOXYCYCLINE HYCLATE 100 MG PO CAPS: 100.0000 mg | ORAL_CAPSULE | Freq: Two times a day (BID) | ORAL | 0 refills | Status: AC

## 2021-05-07 NOTE — ED Provider Notes (Signed)
[facility]    CSN: [id number] Arrival date & time: 05/07/21  1249      History   Chief Complaint Chief Complaint  Patient presents with   Abdominal Pain    HPI Frederick Hess is a 36 y.o. adult.   Pleasant 36 year old male presents today with concerns of suprapubic pain.  He states for the past several days he has been having urinary frequency without discomfort.  He denies hematuria.  He states one of his partners told him he was exposed to an STI, but was not notified of which one.  He denies any penile discharge, testicular swelling, rash.  Patient also notes over the past 2 to 3 days she has some inguinal lymphadenopathy. I did discuss any possibility of pregnancy with patient. Chart documentation stated that patient could possibly become pregnant. Pt stated that he was transgender to male for about 16 years, but decided to revert back to his sex at birth and is fully male. He denies need for pregnancy test in office today.   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      Home Medications    Prior to Admission medications   Medication Sig Start Date End Date Taking? Authorizing Provider  doxycycline (VIBRAMYCIN) 100 MG capsule Take 1 capsule (100 mg total) by mouth 2 (two) times daily for 7 days. 05/07/21 05/14/21 Yes Amarya Kuehl L, PA  aspirin 81 MG chewable tablet Chew 4 tablets (324 mg total) by mouth daily. 01/09/19   Charlann Lange, PA-C  ibuprofen (ADVIL) 600 MG tablet Take 1  tablet (600 mg total) by mouth every 6 (six) hours as needed. 12/25/19   Lamptey, Myrene Galas, MD  meloxicam (MOBIC) 15 MG tablet Take 1 tablet daily as needed for back and groin pain.  Discontinue naproxen. 04/14/19   Molpus, John, MD  SUMAtriptan (IMITREX) 100 MG tablet Take 100 mg by mouth 2 (two) times daily as needed for migraine.  01/06/19   [provider]    Family History Family History  Problem Relation Age of Onset   Diabetes Mother     Social History Social History   Tobacco Use   Smoking status: Former    Packs/day: 1.00    Types: Cigarettes   Smokeless tobacco: Never  Vaping Use   Vaping Use: Never used  Substance Use Topics   Alcohol use: Not Currently   Drug use: Not Currently    Types: Marijuana     Allergies   Amoxicillin   Review of Systems Review of Systems  Gastrointestinal:  Negative for abdominal pain.  Genitourinary:  Positive for frequency and pelvic pain (suprapubic).  Hematological:  Positive for adenopathy (L-sided inguinal).  All other systems reviewed and are negative.   Physical Exam Triage Vital Signs ED Triage Vitals  Enc Vitals Group     BP 05/07/21 1327 128/73     Pulse Rate 05/07/21 1327 74     Resp 05/07/21 1327 18  Temp 05/07/21 1327 98.3 F (36.8 C)     Temp src --      SpO2 05/07/21 1327 96 %     Weight 05/07/21 1325 195 lb 1.7 oz (88.5 kg)     Height 05/07/21 1325 6\' 1"  (1.854 m)     Head Circumference --      Peak Flow --      Pain Score 05/07/21 1325 8     Pain Loc --      Pain Edu? --      Excl. in [place]? --    No data found.  Updated Vital Signs BP 128/73 (BP Location: Right Arm)    Pulse 74    Temp 98.3 F (36.8 C)    Resp 18    Ht 6\' 1"  (1.854 m)    Wt 195 lb 1.7 oz (88.5 kg)    SpO2 96%    BMI 25.74 kg/m   Visual Acuity Right Eye Distance:   Left Eye Distance:   Bilateral Distance:    Right Eye Near:   Left Eye Near:    Bilateral Near:     Physical Exam Vitals and nursing note reviewed.  Chaperone present: declined per pt.  Constitutional:      General: She is not in acute distress.    Appearance: Normal appearance. She is normal weight. She is not ill-appearing or toxic-appearing.  HENT:     Head: Normocephalic and atraumatic.  Abdominal:     General: Abdomen is flat. Bowel sounds are normal. There is no distension.     Palpations: Abdomen is soft. There is no mass.     Tenderness: There is no abdominal tenderness. There is no right CVA tenderness, left CVA tenderness, guarding or rebound.     Hernia: No hernia is present.  Genitourinary:    Penis: Normal.      Testes: Normal.     Comments: Circumcised No rash No testicular pain or swelling 3 swollen and painful lymph nodes to the R inguinal region Musculoskeletal:     Cervical back: Normal range of motion and neck supple. No rigidity or tenderness.     Right lower leg: No edema.     Left lower leg: No edema.  Lymphadenopathy:     Cervical: No cervical adenopathy.  Skin:    General: Skin is warm.     Capillary Refill: Capillary refill takes less than 2 seconds.     Coloration: Skin is not jaundiced.     Findings: No erythema or rash.  Neurological:     General: No focal deficit present.     Mental Status: She is alert and oriented to person, place, and time.     UC Treatments / Results  Labs (all labs ordered are listed, but only abnormal results are displayed) Labs Reviewed  POCT URINALYSIS DIPSTICK, ED / UC - Abnormal; Notable for the following components:      Result Value   Leukocytes,Ua TRACE (*)    All other components within normal limits  CYTOLOGY, (ORAL, ANAL, URETHRAL) ANCILLARY ONLY    EKG   Radiology No results found.  Procedures Procedures (including critical care time)  Medications Ordered in UC Medications - No data to display  Initial Impression / Assessment and Plan / UC Course  I have reviewed the triage vital signs and the nursing notes.  Pertinent labs & imaging  results that were available during my care of the patient were reviewed by me and considered in my  medical decision making (see chart for details).     Urinary frequency - UA done in office, negative.  Acute suprapubic pain - overall exam benign. No reproducible tenderness to palpation Inguinal lymphadenopathy - likely related to STI exposure. Will start doxy empirically given the inguinal lymphadenopathy. Due to pt's PCN reaction (anaphylaxis) will withhold doing any IM treatments and bring back for gentamycin only if positive for GC. Exposure to STI - as above. Pt unaware which one. Labs tests requested as well.  Final Clinical Impressions(s) / UC Diagnoses   Final diagnoses:  Urinary frequency  Acute suprapubic pain  Exposure to sexually transmitted disease (STD)  Inguinal lymphadenopathy     Discharge Instructions      Your suprapubic pain and inguinal lymphadenopathy is likely due to your exposure to an STD. We will start you on doxycycline twice a day x 1 week. We will call with the results once received. Because of your history of anaphylaxis on penicillins, we must await the results of your culture before prescribing additional medications. Your urine sample was negative for infection.  Start taking the antibiotic twice daily, do not stop taking it just because you feel better. Monitor for any adverse reactions. Do not take it within two hours of milk consumption, and avoid multivitamins while on the antibiotic. Please avoid excessive sun exposure or tanning beds while taking. Drink plenty of water.       ED Prescriptions     Medication Sig Dispense Auth. Provider   doxycycline (VIBRAMYCIN) 100 MG capsule Take 1 capsule (100 mg total) by mouth 2 (two) times daily for 7 days. 14 capsule Lajuan Godbee L, PA      PDMP not reviewed this encounter.   Chaney Malling, [place] 05/07/21 1422

## 2021-05-07 NOTE — Discharge Instructions (Addendum)
Your suprapubic pain and inguinal lymphadenopathy is likely due to your exposure to an STD. We will start you on doxycycline twice a day x 1 week. We will call with the results once received. Because of your history of anaphylaxis on penicillins, we must await the results of your culture before prescribing additional medications. Your urine sample was negative for infection.  Start taking the antibiotic twice daily, do not stop taking it just because you feel better. Monitor for any adverse reactions. Do not take it within two hours of milk consumption, and avoid multivitamins while on the antibiotic. Please avoid excessive sun exposure or tanning beds while taking. Drink plenty of water.

## 2021-05-07 NOTE — ED Triage Notes (Addendum)
Pt reports abdominal pain x 2 weeks. Denies n/v/d. Pt also requesting STI testing including blood work.

## 2021-05-08 LAB — CYTOLOGY, (ORAL, ANAL, URETHRAL) ANCILLARY ONLY
Chlamydia: NEGATIVE
Comment: NEGATIVE
Comment: NEGATIVE
Comment: NORMAL
Neisseria Gonorrhea: NEGATIVE
Trichomonas: NEGATIVE

## 2021-05-08 LAB — RPR: RPR Ser Ql: NONREACTIVE

## 2021-08-19 ENCOUNTER — Other Ambulatory Visit: Payer: Self-pay

## 2021-08-19 ENCOUNTER — Encounter (HOSPITAL_COMMUNITY): Payer: Self-pay | Admitting: *Deleted

## 2021-08-19 ENCOUNTER — Ambulatory Visit (HOSPITAL_COMMUNITY)
Admission: EM | Admit: 2021-08-19 | Discharge: 2021-08-19 | Disposition: A | Discharge status: Home / Self Care | Payer: BC Managed Care – PPO | Attending: Internal Medicine | Admitting: Internal Medicine

## 2021-08-19 DIAGNOSIS — K4090 Unilateral inguinal hernia, without obstruction or gangrene, not specified as recurrent: Secondary | ICD-10-CM | POA: Insufficient documentation

## 2021-08-19 DIAGNOSIS — R1031 Right lower quadrant pain: Secondary | ICD-10-CM | POA: Insufficient documentation

## 2021-08-19 DIAGNOSIS — N410 Acute prostatitis: Secondary | ICD-10-CM | POA: Diagnosis not present

## 2021-08-19 DIAGNOSIS — Z113 Encounter for screening for infections with a predominantly sexual mode of transmission: Secondary | ICD-10-CM | POA: Diagnosis not present

## 2021-08-19 LAB — POCT URINALYSIS DIPSTICK, ED / UC
Bilirubin Urine: NEGATIVE
Glucose, UA: NEGATIVE mg/dL
Hgb urine dipstick: NEGATIVE
Ketones, ur: NEGATIVE mg/dL
Leukocytes,Ua: NEGATIVE
Nitrite: NEGATIVE
Protein, ur: NEGATIVE mg/dL
Specific Gravity, Urine: 1.015 (ref 1.005–1.030)
Urobilinogen, UA: 0.2 mg/dL (ref 0.0–1.0)
pH: 6 (ref 5.0–8.0)

## 2021-08-19 MED ORDER — IBUPROFEN 600 MG PO TABS: 600.0000 mg | ORAL_TABLET | Freq: Four times a day (QID) | ORAL | 0 refills | Status: DC | PRN

## 2021-08-19 MED ORDER — DOXYCYCLINE HYCLATE 100 MG PO CAPS: 100.0000 mg | ORAL_CAPSULE | Freq: Two times a day (BID) | ORAL | 0 refills | Status: DC

## 2021-08-19 NOTE — ED Triage Notes (Signed)
Reports seeing blood in his urine and has ABD pain . Sx's for 2 days.

## 2021-08-19 NOTE — ED Provider Notes (Addendum)
[facility]    CSN: [id number] Arrival date & time: 08/19/21  [id number]      History   Chief Complaint Chief Complaint  Patient presents with   Hematuria   Abdominal Pain    HPI Frederick Hess is a 36 y.o. adult.   Patient presents to urgent care for evaluation after he noticed blood in his urine 2 days ago.  Toilet bowl water was pink and he noticed blood at the tip of his penis.  Yesterday, patient noticed blood in his underwear towards the front when he pulled his pants down to urinate and became concerned.  He has been sexually active in the last month and denies known exposure to STI.  Also reporting some burning with urination and incomplete bladder emptying with voiding.  He denies urinary frequency, urgency, and hesitancy.  He also reports mild right and left lower quadrant abdominal pain with urination.  Last normal bowel movement was today and denies diarrhea, nausea, vomiting, low back pain, and fever/chills. No penile discharge, itching, or odor. He works out at [employer] and [employer], but has not changed or increased the amount of weight he has been lifting significantly recently. Does report increased need to strain to fully his bladder. Even when he strains to empty his bladder, he still feels like he cannot fully empty it after voiding. No blood in stool. No other aggravating or relieving factors identified at this time.    Hematuria Associated symptoms include abdominal pain.  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      Home Medications    Prior to Admission medications   Medication Sig Start Date End Date Taking? Authorizing Provider  doxycycline (VIBRAMYCIN) 100 MG capsule Take 1 capsule (100 mg total) by mouth 2 (two) times daily. 08/19/21  Yes Talbot Grumbling, FNP  aspirin 81 MG chewable tablet Chew 4 tablets (324 mg total) by mouth daily. 01/09/19   Charlann Lange, PA-C  ibuprofen (ADVIL) 600 MG tablet Take 1 tablet (600 mg total) by mouth every 6 (six) hours as needed. 08/19/21   Talbot Grumbling, FNP  meloxicam (MOBIC) 15 MG tablet Take 1 tablet daily as needed for back and groin pain.  Discontinue naproxen. 04/14/19   Molpus, John, MD  SUMAtriptan (IMITREX) 100 MG tablet Take 100 mg by mouth 2 (two) times daily as needed for migraine.  01/06/19   [provider]    Family History Family History  Problem Relation Age of Onset   Diabetes Mother     Social History Social History   Tobacco Use   Smoking status: Former    Packs/day: 1.00    Types: Cigarettes   Smokeless tobacco: Never  Vaping Use   Vaping Use: Never used  Substance Use Topics   Alcohol use: Not Currently   Drug use: Not Currently    Types: Marijuana     Allergies   Amoxicillin  Review of Systems Review of Systems  Gastrointestinal:  Positive for abdominal pain.  Genitourinary:  Positive for hematuria.  Per HPI  Physical Exam Triage Vital Signs ED Triage Vitals  Enc Vitals Group     BP 08/19/21 0923 112/72     Pulse Rate 08/19/21 0923 (!) 59     Resp 08/19/21 0923 18     Temp 08/19/21 0923 (!) 97.5 F (36.4 C)     Temp src --      SpO2 08/19/21 0923 96 %     Weight --      Height --      Head Circumference --      Peak Flow --      Pain Score 08/19/21 0922 8     Pain Loc --      Pain Edu? --      Excl. in [place]? --    No data found.  Updated Vital Signs BP 112/72   Pulse (!) 59   Temp (!) 97.5  F (36.4 C)   Resp 18   SpO2 96%   Visual Acuity Right Eye Distance:   Left Eye Distance:   Bilateral Distance:    Right Eye Near:   Left Eye Near:    Bilateral Near:     Physical Exam Vitals and nursing note reviewed. Exam conducted with a chaperone present Luellen Pucker, Therapist, sports).  Constitutional:      General: She is not in acute distress.    Appearance: Normal appearance. She is well-developed. She is not ill-appearing.  HENT:     Head: Normocephalic and atraumatic.     Right Ear: External ear normal.     Left Ear: External ear normal.     Nose: Nose normal.     Mouth/Throat:     Mouth: Mucous membranes are moist.  Eyes:     Extraocular Movements: Extraocular movements intact.     Conjunctiva/sclera: Conjunctivae normal.  Cardiovascular:     Rate and Rhythm: Normal rate and regular rhythm.     Heart sounds: Normal heart sounds. No murmur heard.   No friction rub. No gallop.  Pulmonary:     Effort: Pulmonary effort is normal. No respiratory distress.     Breath sounds: Normal breath sounds. No wheezing, rhonchi or rales.  Chest:     Chest wall: No tenderness.  Abdominal:     General: Abdomen is flat and protuberant.     Palpations: Abdomen is soft.     Tenderness: There is abdominal tenderness in the right lower quadrant.     Hernia: A hernia is present. Hernia is present in the right inguinal area.     Comments: Small inguinal hernia palpated to the right side of patient's groin with swollen femoral lymphnodes present. Femoral lymphnodes are moveable and painless.   Genitourinary:    Pubic Area: No rash.      Penis: Normal and circumcised. No tenderness or swelling.      Testes: Normal.        Right: Mass, tenderness or swelling not present.        Left: Mass, tenderness or swelling not present.     Epididymis:     Right: Normal.     Left: Normal.  Musculoskeletal:        General: No swelling.     Cervical back: Neck supple.  Lymphadenopathy:     Cervical: No  cervical adenopathy.     Lower Body: Right inguinal adenopathy present. No left inguinal  adenopathy.  Skin:    General: Skin is warm and dry.     Capillary Refill: Capillary refill takes less than 2 seconds.     Findings: No rash.  Neurological:     General: No focal deficit present.     Mental Status: She is alert and oriented to person, place, and time. Mental status is at baseline.     Motor: No weakness.     Gait: Gait normal.  Psychiatric:        Mood and Affect: Mood normal.        Behavior: Behavior normal.        Thought Content: Thought content normal.        Judgment: Judgment normal.     UC Treatments / Results  Labs (all labs ordered are listed, but only abnormal results are displayed) Labs Reviewed  URINE CULTURE  HIV ANTIBODY (ROUTINE TESTING W REFLEX)  RPR  POCT URINALYSIS DIPSTICK, ED / UC  CYTOLOGY, (ORAL, ANAL, URETHRAL) ANCILLARY ONLY    EKG   Radiology No results found.  Procedures Procedures (including critical care time)  Medications Ordered in UC Medications - No data to display  Initial Impression / Assessment and Plan / UC Course  I have reviewed the triage vital signs and the nursing notes.  Pertinent labs & imaging results that were available during my care of the patient were reviewed by me and considered in my medical decision making (see chart for details).  Patient is a 36 year old male presenting to urgent care for evaluation of one episode of hematuria 2 days ago. No obvious cause for patient's hematuria noted to physical exam and urinalysis is negative for gross and microscopic hematuria in clinic today. There is no rash, lesions, or blood to physical exam of genitourinary system. There is mild femoral lymph node swelling to the right groin as well as a palpable mild inguinal hernia to physical exam. Patient given information for follow-up with general surgeon on call and encouraged to call to make an appointment for further evaluation.  He is not vomiting, he is not nauseous at this time, and abdominal pain is mild. No clinical indication for emergent evaluation at this time.   Subjective hematuria and incomplete bladder emptying after voiding may be related to an acute bacterial prostatitis caused by potential STI. STI testing pending today. Plan to treat with doxycycline twice daily for the next 10 days since patient is under the age of 55 and is sexually active to cover for possible STI cause for symptoms. [facility] follow-up information given and patient encouraged to call to schedule an appointment to follow-up on intermittent hematuria.   Patient is in the process of establishing care with PCP. Encouraged patient to attempt to get appointment with PCP for further evaluation and management of wellness visits and medical problems.   Counseled patient regarding appropriate use of medications and potential side effects for all medications recommended or prescribed today. Discussed red flag signs and symptoms of worsening condition,when to call the PCP office, return to urgent care, and when to seek higher level of care. Patient verbalizes understanding and agreement with plan. All questions answered. Patient discharged in stable condition.   Final Clinical Impressions(s) / UC Diagnoses   Final diagnoses:  Right inguinal pain  Non-recurrent unilateral inguinal hernia without obstruction or gangrene  Prostatitis, acute     Discharge Instructions      You were seen in urgent care today for blood in your urine.  This  can be caused by a sexually transmitted infection and can also be caused by bacterial prostatitis.  Take doxycycline twice daily for the next 10 days to treat possible infection of your prostate and possible STI.  STI testing is pending and will be back in the next 2 to 3 days.  You will receive a phone call if any of the results change the treatment plan and a prescription will be sent to a pharmacy near you  with proper treatment.   Your urine did not show urinary tract infection today, but I have sent your urine for culture to ensure that it does not grow any bacteria.  You will receive a phone call from [place] if the result is positive.  I would like for you to call [facility] to schedule an appointment for soon as possible to follow-up on the blood that was present in your urine.   Call Dr. Constance Haw office (general surgery) for further evaluation of your inguinal hernia found on physical exam today.   Call your primary care provider and schedule an appointment for soon as possible for follow-up.  If you develop any new or worsening symptoms or do not improve in the next 2 to 3 days, please return.  If your symptoms are severe, please go to the emergency room.  Follow-up with your primary care provider for further evaluation and management of your symptoms as well as ongoing wellness visits.  I hope you feel better!     ED Prescriptions     Medication Sig Dispense Auth. Provider   doxycycline (VIBRAMYCIN) 100 MG capsule Take 1 capsule (100 mg total) by mouth 2 (two) times daily. 20 capsule Joella Prince M, FNP   ibuprofen (ADVIL) 600 MG tablet Take 1 tablet (600 mg total) by mouth every 6 (six) hours as needed. 30 tablet Talbot Grumbling, FNP      PDMP not reviewed this encounter.   Talbot Grumbling, [place] 08/19/21 [place], [place], [place] 08/19/21 1135

## 2021-08-19 NOTE — Discharge Instructions (Signed)
You were seen in urgent care today for blood in your urine.  This can be caused by a sexually transmitted infection and can also be caused by bacterial prostatitis.  Take doxycycline twice daily for the next 10 days to treat possible infection of your prostate and possible STI.  STI testing is pending and will be back in the next 2 to 3 days.  You will receive a phone call if any of the results change the treatment plan and a prescription will be sent to a pharmacy near you with proper treatment.   Your urine did not show urinary tract infection today, but I have sent your urine for culture to ensure that it does not grow any bacteria.  You will receive a phone call from [place] if the result is positive.  I would like for you to call [facility] to schedule an appointment for soon as possible to follow-up on the blood that was present in your urine.   Call Dr. Constance Haw office (general surgery) for further evaluation of your inguinal hernia found on physical exam today.   Call your primary care provider and schedule an appointment for soon as possible for follow-up.  If you develop any new or worsening symptoms or do not improve in the next 2 to 3 days, please return.  If your symptoms are severe, please go to the emergency room.  Follow-up with your primary care provider for further evaluation and management of your symptoms as well as ongoing wellness visits.  I hope you feel better!

## 2021-08-20 LAB — CYTOLOGY, (ORAL, ANAL, URETHRAL) ANCILLARY ONLY
Chlamydia: NEGATIVE
Comment: NEGATIVE
Comment: NEGATIVE
Comment: NORMAL
Neisseria Gonorrhea: NEGATIVE
Trichomonas: NEGATIVE

## 2021-08-20 LAB — HIV ANTIBODY (ROUTINE TESTING W REFLEX): HIV Screen 4th Generation wRfx: NONREACTIVE

## 2021-08-20 LAB — URINE CULTURE: Culture: NO GROWTH

## 2021-08-20 LAB — RPR: RPR Ser Ql: NONREACTIVE

## 2022-04-02 ENCOUNTER — Other Ambulatory Visit: Payer: Self-pay | Admitting: Family Medicine

## 2022-04-02 DIAGNOSIS — G8929 Other chronic pain: Secondary | ICD-10-CM

## 2022-04-02 DIAGNOSIS — R590 Localized enlarged lymph nodes: Secondary | ICD-10-CM

## 2022-04-02 DIAGNOSIS — Z86718 Personal history of other venous thrombosis and embolism: Secondary | ICD-10-CM

## 2022-04-28 ENCOUNTER — Other Ambulatory Visit: Payer: BC Managed Care – PPO

## 2022-08-07 ENCOUNTER — Encounter (HOSPITAL_COMMUNITY): Payer: Self-pay | Admitting: Emergency Medicine

## 2022-08-07 ENCOUNTER — Other Ambulatory Visit: Payer: Self-pay

## 2022-08-07 ENCOUNTER — Ambulatory Visit (HOSPITAL_COMMUNITY)
Admission: EM | Admit: 2022-08-07 | Discharge: 2022-08-07 | Disposition: A | Discharge status: Home / Self Care | Payer: BC Managed Care – PPO | Attending: Emergency Medicine | Admitting: Emergency Medicine

## 2022-08-07 DIAGNOSIS — H1032 Unspecified acute conjunctivitis, left eye: Secondary | ICD-10-CM | POA: Diagnosis not present

## 2022-08-07 HISTORY — DX: Migraine, unspecified, not intractable, without status migrainosus: G43.909

## 2022-08-07 MED ORDER — ERYTHROMYCIN 5 MG/GM OP OINT: TOPICAL_OINTMENT | OPHTHALMIC | 0 refills | Status: DC

## 2022-08-07 NOTE — ED Triage Notes (Signed)
Pt has had watering of right eye for 4 days. C/o soreness to eye.  No redness noted to sclera.  No drainage seen at this time. No pain. No headache r/t eye pain.  Supposed to wear glasses but does not have any.  Pt reports keeps rubbing eye. Denies injury.  Right eye 20/40 Left eye 20/70 Both 20/40

## 2022-08-07 NOTE — Discharge Instructions (Addendum)
I am covering you with erythromycin ointment for bacterial pathogens.  Please ensure you are washing your hands frequently.  You can do a cool compress to help with swelling.  If you feel pain or discomfort, you can take 800 mg of ibuprofen every 8 hours as needed.   I have given you some information for an ophthalmologist.  Please seek immediate care if you develop vision loss, sudden vision pain, or any new concerning symptoms.  It is a good idea to have a eye examination routinely, they can evaluate you and get you fitted for glasses as well.  You can return to clinic for any new or concerning symptoms.

## 2022-08-07 NOTE — ED Provider Notes (Signed)
[facility]    CSN: [id number] Arrival date & time: 08/07/22  1931      History   Chief Complaint Chief Complaint  Patient presents with   Eye Drainage    HPI Frederick Hess is a 37 y.o. adult.   Patient reports left-sided eye drainage that has been ongoing for the past 4 days.  Reports his left eye is sore and swollen with some redness.  Reports his eye has been draining throughout the day, he is unsure what color the drainage is because he just wipes it away.  He is supposed to wear glasses, does not have any and has not been seen by ophthalmologist recently.  Denies acute changes to vision, denies eye pain.  Reports vision will be intermittently blurry, clears as he blinks with the discharge.    The history is provided by the patient and medical records.    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      Home Medications    Prior to Admission medications   Medication Sig Start Date End Date Taking? Authorizing Provider  erythromycin ophthalmic ointment Place a 1/2 inch ribbon of ointment into the lower left eyelid 4x daily x5 days 08/07/22  Yes Rinaldo Ratel, [place] N, FNP  aspirin 81 MG chewable tablet Chew 4 tablets (324 mg total) by mouth daily. Patient not taking: Reported on 08/07/2022 01/09/19   Elpidio Anis, PA-C  ibuprofen (ADVIL) 600 MG tablet Take 1 tablet (600 mg total) by mouth every 6 (six) hours as needed. 08/19/21   Carlisle Beers, FNP  meloxicam (MOBIC) 15 MG tablet  Take 1 tablet daily as needed for back and groin pain.  Discontinue naproxen. Patient not taking: Reported on 08/07/2022 04/14/19   Molpus, Jonny Ruiz, MD    Family History Family History  Problem Relation Age of Onset   Diabetes Mother     Social History Social History   Tobacco Use   Smoking status: Former    Packs/day: 1    Types: Cigarettes   Smokeless tobacco: Never  Vaping Use   Vaping Use: Never used  Substance Use Topics   Alcohol use: Not Currently   Drug use: Not Currently    Types: Marijuana     Allergies   Amoxicillin   Review of Systems Review of Systems  Eyes:  Positive for discharge, redness and itching. Negative for photophobia, pain and visual disturbance.     Physical Exam Triage Vital Signs ED Triage Vitals [08/07/22 1944]  Enc Vitals Group     BP (!) 147/88     Pulse Rate 79     Resp 14     Temp (!) 97.3 F (36.3 C)     Temp Source Oral     SpO2 96 %     Weight 195 lb (88.5 kg)     Height 6\' 1"  (1.854 m)     Head Circumference      Peak Flow  Pain Score 8     Pain Loc      Pain Edu?      Excl. in GC?    No data found.  Updated Vital Signs BP (!) 147/88   Pulse 79   Temp (!) 97.3 F (36.3 C) (Oral)   Resp 14   Ht 6\' 1"  (1.854 m)   Wt 195 lb (88.5 kg)   SpO2 96%   BMI 25.73 kg/m   Visual Acuity Right Eye Distance:   Left Eye Distance:   Bilateral Distance:    Right Eye Near:   Left Eye Near:    Bilateral Near:     Physical Exam Vitals and nursing note reviewed.  Constitutional:      Appearance: Normal appearance.  HENT:     Head: Normocephalic and atraumatic.     Right Ear: External ear normal.     Left Ear: External ear normal.     Nose: Nose normal.     Mouth/Throat:     Mouth: Mucous membranes are moist.  Eyes:     General: Lids are normal. Lids are everted, no foreign bodies appreciated. Vision grossly intact. Gaze aligned appropriately. No scleral icterus.       Left eye: Discharge present.    Extraocular  Movements: Extraocular movements intact.     Conjunctiva/sclera:     Left eye: Left conjunctiva is injected.     Pupils: Pupils are equal, round, and reactive to light.  Cardiovascular:     Rate and Rhythm: Normal rate.  Pulmonary:     Effort: Pulmonary effort is normal. No respiratory distress.  Neurological:     General: No focal deficit present.     Mental Status: She is alert and oriented to person, place, and time.  Psychiatric:        Mood and Affect: Mood normal.        Behavior: Behavior normal.      UC Treatments / Results  Labs (all labs ordered are listed, but only abnormal results are displayed) Labs Reviewed - No data to display  EKG   Radiology No results found.  Procedures Procedures (including critical care time)  Medications Ordered in UC Medications - No data to display  Initial Impression / Assessment and Plan / UC Course  I have reviewed the triage vital signs and the nursing notes.  Pertinent labs & imaging results that were available during my care of the patient were reviewed by me and considered in my medical decision making (see chart for details).  Vitals and triage reviewed, patient is hemodynamically stable. Left eye with erythema and upper lid edema, ongoing for the past four days.  Without red flag symptoms of pain, foreign body or vision changes.  Does not wear contacts.  Will cover with erythromycin ointment for potential bacterial etiology.  Encouraged to follow-up with ophthalmology for further evaluation if symptoms persist, and for evaluation for glasses.  Plan of care, follow-up care, and return precautions given, no questions at this time.  Work note provided.    Final Clinical Impressions(s) / UC Diagnoses   Final diagnoses:  Acute conjunctivitis of left eye, unspecified acute conjunctivitis type     Discharge Instructions      I am covering you with erythromycin ointment for bacterial pathogens.  Please ensure you are  washing your hands frequently.  You can do a cool compress to help with swelling.  If you feel pain or discomfort, you can take 800 mg of ibuprofen  every 8 hours as needed.   I have given you some information for an ophthalmologist.  Please seek immediate care if you develop vision loss, sudden vision pain, or any new concerning symptoms.  It is a good idea to have a eye examination routinely, they can evaluate you and get you fitted for glasses as well.  You can return to clinic for any new or concerning symptoms.      ED Prescriptions     Medication Sig Dispense Auth. Provider   erythromycin ophthalmic ointment Place a 1/2 inch ribbon of ointment into the lower left eyelid 4x daily x5 days 3.5 g Cintya Daughety, [place] N, FNP      PDMP not reviewed this encounter.   Collins Dimaria, [place] N, [place] 08/07/22 2003

## 2022-08-30 ENCOUNTER — Other Ambulatory Visit: Payer: Self-pay

## 2022-08-30 ENCOUNTER — Encounter (HOSPITAL_COMMUNITY): Payer: Self-pay

## 2022-08-30 ENCOUNTER — Emergency Department (HOSPITAL_COMMUNITY)
Admission: EM | Admit: 2022-08-30 | Discharge: 2022-08-30 | Disposition: A | Discharge status: Home / Self Care | Payer: BC Managed Care – PPO | Attending: Emergency Medicine | Admitting: Emergency Medicine

## 2022-08-30 ENCOUNTER — Emergency Department (HOSPITAL_COMMUNITY): Payer: BC Managed Care – PPO

## 2022-08-30 DIAGNOSIS — Z7982 Long term (current) use of aspirin: Secondary | ICD-10-CM | POA: Diagnosis not present

## 2022-08-30 DIAGNOSIS — E860 Dehydration: Secondary | ICD-10-CM | POA: Diagnosis not present

## 2022-08-30 DIAGNOSIS — Z1152 Encounter for screening for COVID-19: Secondary | ICD-10-CM | POA: Diagnosis not present

## 2022-08-30 DIAGNOSIS — R509 Fever, unspecified: Secondary | ICD-10-CM

## 2022-08-30 DIAGNOSIS — R112 Nausea with vomiting, unspecified: Secondary | ICD-10-CM | POA: Diagnosis present

## 2022-08-30 LAB — URINALYSIS, W/ REFLEX TO CULTURE (INFECTION SUSPECTED)
Bacteria, UA: NONE SEEN
Bilirubin Urine: NEGATIVE
Glucose, UA: NEGATIVE mg/dL
Ketones, ur: NEGATIVE mg/dL
Leukocytes,Ua: NEGATIVE
Nitrite: NEGATIVE
Protein, ur: NEGATIVE mg/dL
Specific Gravity, Urine: 1.009 (ref 1.005–1.030)
pH: 6 (ref 5.0–8.0)

## 2022-08-30 LAB — COMPREHENSIVE METABOLIC PANEL
ALT: 16 U/L (ref 0–44)
AST: 21 U/L (ref 15–41)
Albumin: 3.8 g/dL (ref 3.5–5.0)
Alkaline Phosphatase: 27 U/L — ABNORMAL LOW (ref 38–126)
Anion gap: 9 (ref 5–15)
BUN: 14 mg/dL (ref 6–20)
CO2: 25 mmol/L (ref 22–32)
Calcium: 8.8 mg/dL — ABNORMAL LOW (ref 8.9–10.3)
Chloride: 102 mmol/L (ref 98–111)
Creatinine, Ser: 1.55 mg/dL — ABNORMAL HIGH (ref 0.61–1.24)
GFR, Estimated: 58 mL/min — ABNORMAL LOW (ref >60)
Glucose, Bld: 100 mg/dL — ABNORMAL HIGH (ref 70–99)
Potassium: 3.5 mmol/L (ref 3.5–5.1)
Sodium: 136 mmol/L (ref 135–145)
Total Bilirubin: 0.7 mg/dL (ref 0.3–1.2)
Total Protein: 7.6 g/dL (ref 6.5–8.1)

## 2022-08-30 LAB — CBC WITH DIFFERENTIAL/PLATELET
Abs Immature Granulocytes: 0 10*3/uL (ref 0.00–0.07)
Basophils Absolute: 0 10*3/uL (ref 0.0–0.1)
Basophils Relative: 0 %
Eosinophils Absolute: 0 10*3/uL (ref 0.0–0.5)
Eosinophils Relative: 0 %
HCT: 42.4 % (ref 39.0–52.0)
Hemoglobin: 14.4 g/dL (ref 13.0–17.0)
Immature Granulocytes: 0 %
Lymphocytes Relative: 26 %
Lymphs Abs: 1.2 10*3/uL (ref 0.7–4.0)
MCH: 30.5 pg (ref 26.0–34.0)
MCHC: 34 g/dL (ref 30.0–36.0)
MCV: 89.8 fL (ref 80.0–100.0)
Monocytes Absolute: 0.6 10*3/uL (ref 0.1–1.0)
Monocytes Relative: 12 %
Neutro Abs: 2.9 10*3/uL (ref 1.7–7.7)
Neutrophils Relative %: 62 %
Platelets: 116 10*3/uL — ABNORMAL LOW (ref 150–400)
RBC: 4.72 MIL/uL (ref 4.22–5.81)
RDW: 13.8 % (ref 11.5–15.5)
WBC: 4.8 10*3/uL (ref 4.0–10.5)
nRBC: 0 % (ref 0.0–0.2)

## 2022-08-30 LAB — SARS CORONAVIRUS 2 BY RT PCR: SARS Coronavirus 2 by RT PCR: NEGATIVE

## 2022-08-30 LAB — LACTIC ACID, PLASMA: Lactic Acid, Venous: 0.8 mmol/L (ref 0.5–1.9)

## 2022-08-30 MED ORDER — DIPHENHYDRAMINE HCL 50 MG/ML IJ SOLN
25.0000 mg | Freq: Once | INTRAMUSCULAR | Status: AC
  Administered 2022-08-30: 25 mg via INTRAVENOUS
  Filled 2022-08-30: qty 1

## 2022-08-30 MED ORDER — SODIUM CHLORIDE 0.9 % IV BOLUS
1000.0000 mL | Freq: Once | INTRAVENOUS | Status: AC
  Administered 2022-08-30: 1000 mL via INTRAVENOUS

## 2022-08-30 MED ORDER — METOCLOPRAMIDE HCL 5 MG/ML IJ SOLN
10.0000 mg | Freq: Once | INTRAMUSCULAR | Status: AC
  Administered 2022-08-30: 10 mg via INTRAVENOUS
  Filled 2022-08-30: qty 2

## 2022-08-30 MED ORDER — ACETAMINOPHEN 325 MG PO TABS
650.0000 mg | ORAL_TABLET | Freq: Once | ORAL | Status: AC | PRN
  Administered 2022-08-30: 650 mg via ORAL
  Filled 2022-08-30: qty 2

## 2022-08-30 NOTE — Discharge Instructions (Addendum)
Please read and follow all provided instructions.  Your diagnoses today include:  1. Febrile illness   2. Dehydration    Tests performed today include: Blood cell count and electrolytes: Showed normal infection fighting cells Kidney function: Was a bit weak, possibly from dehydration, you should have this rechecked by your doctor in the next 2 weeks COVID testing: Was normal Urine testing: No signs of infection in the urine Testing for sepsis: Was negative Vital signs. See below for your results today.   Medications prescribed:  Please use over-the-counter NSAID medications (ibuprofen, naproxen) or Tylenol (acetaminophen) as directed on the packaging for pain -- as long as you do not have any reasons avoid these medications. Reasons to avoid NSAID medications include: weak kidneys, a history of bleeding in your stomach or gut, or uncontrolled high blood pressure or previous heart attack. Reasons to avoid Tylenol include: liver problems or ongoing alcohol use. Never take more than 4000mg  or 8 Extra strength Tylenol in a 24 hour period.     Take any prescribed medications only as directed.  Home care instructions:  Follow any educational materials contained in this packet.  BE VERY CAREFUL not to take multiple medicines containing Tylenol (also called acetaminophen). Doing so can lead to an overdose which can damage your liver and cause liver failure and possibly death.   Follow-up instructions: Please follow-up with your primary care provider in the next 2 days for further evaluation of your symptoms.   Return instructions:  Please return to the Emergency Department if you experience worsening symptoms.  Return if you have worsening severe headache or confusion, persistent vomiting, trouble walking or talking Please return if you have any other emergent concerns.  Additional Information:  Your vital signs today were: BP 109/66   Pulse 65   Temp 99.7 F (37.6 C) (Oral)   Resp 16    Ht 6\' 1"  (1.854 m)   Wt 88.5 kg   SpO2 97%   BMI 25.73 kg/m  If your blood pressure (BP) was elevated above 135/85 this visit, please have this repeated by your doctor within one month. --------------

## 2022-08-30 NOTE — ED Triage Notes (Signed)
Pt arrived via POV. C/o fever, dry eyes, headache, and feeling of dehydration since yesterday. Pt febrile. Aox4.

## 2022-08-30 NOTE — ED Provider Notes (Signed)
[place] EMERGENCY DEPARTMENT AT [facility] Provider Note   CSN: [id number] Arrival date & time: 08/30/22  1628     History  Chief Complaint  Patient presents with   Fever   Dehydration   Headache    Frederick Hess is a 37 y.o. adult.  Patient presents to the emergency department today for evaluation of  headache, fever, dry eyes for 2 days.  Patient has had associated nausea without vomiting.  Headache is generalized and throbbing.  No confusion.  There is light and sound sensitivity noted.  No significant abdominal pain.  Mild dysuria without blood.  No significant flank pain.  No URI symptoms.  No known sick contacts.  No skin rashes or tick bites reported.  They have been taking ibuprofen at home without improvement.  No history of chronic medical problems.  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       Home Medications Prior to Admission medications   Medication Sig Start Date End Date Taking? Authorizing Provider  aspirin 81 MG chewable tablet Chew 4 tablets (324 mg total) by mouth daily. Patient not taking: Reported on 08/07/2022 01/09/19   Elpidio Anis, PA-C  erythromycin ophthalmic ointment Place a 1/2 inch ribbon of ointment into the lower left eyelid 4x daily x5 days 08/07/22   Garrison, [place] N, FNP  ibuprofen (ADVIL) 600 MG tablet Take 1 tablet (600 mg total) by mouth every 6 (six) hours as needed. 08/19/21   Carlisle Beers, FNP  meloxicam (MOBIC) 15 MG tablet Take 1 tablet daily as needed for back and groin pain.  Discontinue naproxen. Patient not taking: Reported on 08/07/2022 04/14/19   Molpus, Jonny Ruiz, MD      Allergies    Amoxicillin    Review of Systems   Review of Systems  Physical Exam Updated Vital Signs BP 128/78 (BP Location: Left Arm)   Pulse 89   Temp (!) 102.8 F (39.3 C) (Oral)   Resp 18   Ht 6\' 1"  (1.854 m)   Wt 88.5 kg   SpO2 96%   BMI 25.73 kg/m   Physical Exam Vitals and nursing note reviewed.   Constitutional:      Appearance: She is well-developed.  HENT:     Head: Normocephalic and atraumatic.     Jaw: No trismus.     Right Ear: Tympanic membrane, ear canal and external ear normal.     Left Ear: Tympanic membrane, ear canal and external ear normal.     Nose: Nose normal. No mucosal edema or rhinorrhea.     Mouth/Throat:     Mouth: Mucous membranes are moist. Mucous membranes are not dry. No oral lesions.     Pharynx: Uvula midline. No oropharyngeal exudate, posterior oropharyngeal erythema or uvula swelling.     Tonsils: No tonsillar abscesses.  Eyes:     General:        Right eye: No discharge.        Left eye: No discharge.     Conjunctiva/sclera: Conjunctivae normal.  Neck:     Comments: Full range of motion of neck, actively, without difficulty. Cardiovascular:     Rate and Rhythm: Normal rate and regular rhythm.     Heart sounds: Normal heart sounds.  Pulmonary:     Effort: Pulmonary effort is normal. No respiratory distress.     Breath sounds: Normal breath sounds. No wheezing or rales.  Abdominal:     Palpations: Abdomen is soft.  Tenderness: There is no abdominal tenderness. There is no guarding or rebound.  Musculoskeletal:     Cervical back: Normal range of motion and neck supple.  Lymphadenopathy:     Cervical: No cervical adenopathy.  Skin:    General: Skin is warm and dry.  Neurological:     Mental Status: She is alert.  Psychiatric:        Mood and Affect: Mood normal.     ED Results / Procedures / Treatments   Labs (all labs ordered are listed, but only abnormal results are displayed) Labs Reviewed  COMPREHENSIVE METABOLIC PANEL - Abnormal; Notable for the following components:      Result Value   Glucose, Bld 100 (*)    Creatinine, Ser 1.55 (*)    Calcium 8.8 (*)    Alkaline Phosphatase 27 (*)    GFR, Estimated 58 (*)    All other components within normal limits  CBC WITH DIFFERENTIAL/PLATELET - Abnormal; Notable for the following  components:   Platelets 116 (*)    All other components within normal limits  URINALYSIS, W/ REFLEX TO CULTURE (INFECTION SUSPECTED) - Abnormal; Notable for the following components:   Color, Urine STRAW (*)    Hgb urine dipstick SMALL (*)    All other components within normal limits  SARS CORONAVIRUS 2 BY RT PCR  LACTIC ACID, PLASMA    EKG None  Radiology DG Chest 2 View  Result Date: 08/30/2022 CLINICAL DATA:  Questionable sepsis EXAM: CHEST - 2 VIEW COMPARISON:  Chest x-ray 05/22/2017 FINDINGS: The heart size and mediastinal contours are within normal limits. Both lungs are clear. The visualized skeletal structures are unremarkable. IMPRESSION: No active cardiopulmonary disease. Electronically Signed   By: Darliss Cheney M.D.   On: 08/30/2022 18:38    Procedures Procedures    Medications Ordered in ED Medications  acetaminophen (TYLENOL) tablet 650 mg (650 mg Oral Given 08/30/22 1806)  metoCLOPramide (REGLAN) injection 10 mg (10 mg Intravenous Given 08/30/22 1801)  diphenhydrAMINE (BENADRYL) injection 25 mg (25 mg Intravenous Given 08/30/22 1800)  sodium chloride 0.9 % bolus 1,000 mL (0 mLs Intravenous Stopped 08/30/22 1903)  sodium chloride 0.9 % bolus 1,000 mL (1,000 mLs Intravenous New Bag/Given 08/30/22 1918)    ED Course/ Medical Decision Making/ A&P    Patient seen and examined. History obtained directly from patient.   Labs/EKG: Ordered CBC, CMP, lactate, UA.  Imaging: Ordered chest x-ray.  Medications/Fluids: Ordered: Tylenol ordered in triage, ordered Reglan, Benadryl for headache, IV fluid bolus as well  Most recent vital signs reviewed and are as follows: BP 128/78 (BP Location: Left Arm)   Pulse 89   Temp (!) 102.8 F (39.3 C) (Oral)   Resp 18   Ht 6\' 1"  (1.854 m)   Wt 88.5 kg   SpO2 96%   BMI 25.73 kg/m   Initial impression: Fever, headache, exam clinically low suspicion for meningitis, but will need reassessment.  Workup pending.  8:08 PM  Reassessment performed. Patient appears improved.  They stated they are feeling much better now.  Headache is much better.  Patient took off their sunglasses in the room.  Labs personally reviewed and interpreted including: CBC with slightly low platelet count otherwise unremarkable with normal white blood cell count, normal white blood cell count; CMP with elevated creatinine at 1.55 being treated with IV fluids, otherwise unremarkable electrolytes and blood sugar; lactate 0.8, COVID-negative, UA without signs of infection.  Imaging personally visualized and interpreted including: Chest x-ray, agree clear  without signs of pneumonia.  Reviewed pertinent lab work and imaging with patient at bedside. Questions answered.   Most current vital signs reviewed and are as follows: BP 133/80   Pulse 65   Temp 99.3 F (37.4 C) (Oral)   Resp 16   Ht 6\' 1"  (1.854 m)   Wt 88.5 kg   SpO2 97%   BMI 25.73 kg/m   Plan: Will reassess.  Patient is clinically much improved.  They have not developed any signs of meningitis and I have overall low concern for meningitis at this time.  I did discuss this consideration with the patient.  Discussed that risks of LP at this point given good clinical picture, likely do not exceed the risks.  They are in agreement.  10:26 PM Reassessment performed. Patient appears very well. They continue to feel well.  No evidence of meningitis, confusion, vomiting.  States that they feel comfortable going home at this point.  Reviewed pertinent lab work and imaging with patient at bedside. Questions answered.   Most current vital signs reviewed and are as follows: BP 121/75   Pulse 65   Temp 99.7 F (37.6 C) (Oral)   Resp 18   Ht 6\' 1"  (1.854 m)   Wt 88.5 kg   SpO2 98%   BMI 25.73 kg/m   Plan: Discharge to home.   Prescriptions written for: None  Other home care instructions discussed: Continued OTC meds, rest, hydration  ED return instructions discussed: Worsening  headache, vomiting, confusion, trouble walking or talking, new problems or other concerns  Follow-up instructions discussed: Patient encouraged to follow-up with their PCP in 2 days.                             Medical Decision Making Amount and/or Complexity of Data Reviewed Labs: ordered. Radiology: ordered. ECG/medicine tests: ordered.  Risk OTC drugs. Prescription drug management.   Overall well-appearing patient with febrile illness.  Unclear source however workup in the ED is very reassuring.  They had headache, likely related to fever.  This was improved.  No meningeal signs.  Do not suspect meningitis or encephalitis. No confusion or vomiting.  Lab workup overall very reassuring.  Mild AKI treated with IV fluids.  No rash or recent tick bite to suggest rickettsial disease.  COVID-negative.  UA negative for UTI.  Chest x-ray negative for pneumonia.  Lactate normal, low concern for sepsis. Patient appears reliable to return if something gets worse.  The patient's vital signs, pertinent lab work and imaging were reviewed and interpreted as discussed in the ED course. Hospitalization was considered for further testing, treatments, or serial exams/observation. However as patient is well-appearing, has a stable exam, and reassuring studies today, I do not feel that they warrant admission at this time. This plan was discussed with the patient who verbalizes agreement and comfort with this plan and seems reliable and able to return to the Emergency Department with worsening or changing symptoms.          Final Clinical Impression(s) / ED Diagnoses Final diagnoses:  Febrile illness  Dehydration    Rx / DC Orders ED Discharge Orders     None         Renne Crigler, PA-C 08/30/22 2231    Charlynne Pander, MD 08/30/22 [phone]

## 2022-09-03 ENCOUNTER — Other Ambulatory Visit (HOSPITAL_COMMUNITY): Payer: Self-pay | Admitting: Family Medicine

## 2022-09-03 DIAGNOSIS — R109 Unspecified abdominal pain: Secondary | ICD-10-CM

## 2022-09-03 DIAGNOSIS — K625 Hemorrhage of anus and rectum: Secondary | ICD-10-CM

## 2022-09-04 ENCOUNTER — Ambulatory Visit (HOSPITAL_BASED_OUTPATIENT_CLINIC_OR_DEPARTMENT_OTHER)
Admission: RE | Admit: 2022-09-04 | Discharge: 2022-09-04 | Disposition: A | Discharge status: Home / Self Care | Payer: BC Managed Care – PPO | Source: Ambulatory Visit | Attending: Family Medicine | Admitting: Family Medicine

## 2022-09-04 DIAGNOSIS — K625 Hemorrhage of anus and rectum: Secondary | ICD-10-CM

## 2022-09-04 DIAGNOSIS — R109 Unspecified abdominal pain: Secondary | ICD-10-CM | POA: Insufficient documentation

## 2022-09-04 MED ORDER — IOHEXOL 300 MG/ML  SOLN
100.0000 mL | Freq: Once | INTRAMUSCULAR | Status: AC | PRN
  Administered 2022-09-04: 80 mL via INTRAVENOUS

## 2022-09-15 ENCOUNTER — Emergency Department (HOSPITAL_COMMUNITY)
Admission: EM | Admit: 2022-09-15 | Discharge: 2022-09-15 | Discharge status: Against Medical Advice | Payer: BC Managed Care – PPO | Attending: Emergency Medicine | Admitting: Emergency Medicine

## 2022-09-15 ENCOUNTER — Other Ambulatory Visit: Payer: Self-pay

## 2022-09-15 DIAGNOSIS — Z5321 Procedure and treatment not carried out due to patient leaving prior to being seen by health care provider: Secondary | ICD-10-CM | POA: Diagnosis not present

## 2022-09-15 DIAGNOSIS — R5383 Other fatigue: Secondary | ICD-10-CM | POA: Insufficient documentation

## 2022-09-15 NOTE — ED Triage Notes (Signed)
Patient report fatigue x 1 week. Patient report worsening fatigue tonight. Patient stated she felt dry and felt like dehydrated like 2 weeks ago. Patient denies N/V. Patient a/ox4.

## 2022-09-22 ENCOUNTER — Telehealth: Payer: Self-pay | Admitting: Pharmacy Technician

## 2022-09-22 ENCOUNTER — Other Ambulatory Visit (HOSPITAL_COMMUNITY): Payer: Self-pay

## 2022-09-22 NOTE — Telephone Encounter (Signed)
RCID Patient Product/process development scientist completed.    The patient is insured through [employer] and can only fill at [facility]  We will continue to follow to see if copay assistance is needed.

## 2022-09-23 NOTE — Progress Notes (Unsigned)
[address], [place], [place], [address]                                                                  Phn. [phone]; Fax: [phone]                                                                             Date: 09/24/22  Reason for Visit: HIV Intake   HPI: [place] is a 37 y.o.old adult with new diagnosis of HIV, substance abuse, depression referred for evaluation and management. 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 Denies being on any hormonal supplements currently.   Seen in the ED on 6/16 for fevers with unclear source as well as headache. Work up was unremarkable with mild aki treated with IVF and was discharged from ED with PCP fu. He saw his PCP 2 days later who did lab work and was found to have HIV. All of his prior symptoms have resolved including headache however he reports intermittent constipation for which he is increasing fiber in diet and taking laxatives. Complains of drenching night swears for couple of weeks but denies fevers, chills.  Denies nausea, vomiting, abdominal pain, diarrhea, loss of appetite and weight loss. Denies SOB, cough and chest pain. Denies GU complaints.   Discussed about enrolling in research for HIV vs starting of ART today to which he would like the latter and get treatment started.   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 Denies nausea, vomiting, abdominal pain, diarrhea, loss of appetite and weight loss, chills. Denies SOB, cough and chest pain. Denies GU complaints. Denies recent hospitalizations. Denies rashes, joint complaints, headaches/neurological complaints   Allergies  Allergen Reactions   Amoxicillin Anaphylaxis and Hives    Has patient had a PCN reaction causing immediate rash, facial/tongue/throat swelling, SOB or lightheadedness with hypotension: Yes Has patient had a PCN reaction causing severe rash involving mucus membranes or skin necrosis: Yes Has patient had a PCN reaction that required hospitalization: ED visit Has patient had a PCN reaction occurring within the last 10 years: Yes, in 2015 If all of the above answers are "NO", then may proceed  with Cephalosporin use.    Past Medical History:  Diagnosis Date   Hormone replacement therapy    Migraine    Transexualism     Past Surgical History:  Procedure Laterality Date   COSMETIC SURGERY     rhinoplasty   RHINOPLASTY     Social History   Socioeconomic History   Marital status: Single    Spouse name: Not on file   Number of children: Not on file   Years of education: Not on file   Highest education level: Not on file  Occupational History   Not on file  Tobacco Use   Smoking status: Former    Packs/day: 1    Types: Cigarettes   Smokeless tobacco: Never  Vaping Use   Vaping Use: Never used  Substance and Sexual Activity   Alcohol use: Not Currently   Drug use: Not Currently    Types: Marijuana   Sexual activity: Not on file  Other Topics Concern   Not on file  Social History Narrative   Not on file   Social Determinants of Health   Financial Resource Strain: Not on file  Food Insecurity: Not on file  Transportation Needs: Not on file  Physical Activity: Not on file  Stress: Not on file  Social Connections: Not on file  Intimate Partner Violence: Not on  file   Family History  Problem Relation Age of Onset   Diabetes Mother     Vitals  BP 120/78   Pulse 72   Temp 97.8 F (36.6 C) (Oral)   Ht 6\' 1"  (1.854 m)   Wt 198 lb (89.8 kg)   SpO2 98%   BMI 26.12 kg/m   Examination  Gen: Alert and oriented x 3, no acute distress HEENT: [place]/AT, no scleral icterus, no pale conjunctivae, hearing normal, oral mucosa moist Neck: Supple Cardio: Regular rate and rhythm; +S1 and S2 Resp: CTAB GI: nondistended GU: Musc: Extremities: No pedal edema Skin: No rashes Neuro: grossly non focal, awake, alert and oriented, follows commands. ambulatory Psych: Calm, cooperative  Lab Results No results found for: "HIV1RNAQUANT", "HIV1RNAVL", "CD4TABS" No results found for: "HIV1GENOSEQ" Lab Results  Component Value Date   WBC 4.8 08/30/2022   HGB 14.4 08/30/2022   HCT 42.4 08/30/2022   MCV 89.8 08/30/2022   PLT 116 (L) 08/30/2022    Lab Results  Component Value Date   CREATININE 1.55 (H) 08/30/2022   BUN 14 08/30/2022   NA 136 08/30/2022   K 3.5 08/30/2022   CL 102 08/30/2022   CO2 25 08/30/2022   Lab Results  Component Value Date   ALT 16 08/30/2022   AST 21 08/30/2022   ALKPHOS 27 (L) 08/30/2022   BILITOT 0.7 08/30/2022    No results found for: "CHOL", "TRIG", "HDL", "LDLCALC" No results found for: "HAV" No results found for: "HEPBSAG", "HEPBSAB" No results found for: "HCVAB" Lab Results  Component Value Date   CHLAMYDIAWP Negative 08/19/2021   N Negative 08/19/2021   No results found for: "GCPROBEAPT" No results found for: "QUANTGOLD"    Health Maintenance:  There is no immunization history on file for this patient.  Assessment/Plan: # HIV Discussed with patient treatment options and side effects, benefits of treatment, long term outcomes.  Discussed the severity of untreated HIV including higher cancer risk, opportunistic infections, renal failure.  Discussed needing to use condoms, partner disclosure, necessary  vaccines, blood monitoring.     Start [employer] Intake labs including BMP Pharmacy to see [place]  in 6 week  # Night sweats  2 sets of peripheral blood cultures  # STD Screening  Oral, analGC Recent Urine GC and RPR negative No acute concerns   #Immunization  Will discuss in subsequent visits  # Health Maintenance: He is willing to see dentist Dental referral placed today for [facility]. Information to schedule appointment completed today.    Patient's labs were reviewed as well as his previous records. Patients questions were addressed and answered. Safe sex counseling done.   I have personally spent 68  minutes involved in face-to-face and non-face-to-face activities for this patient on the day of the visit. Professional time spent includes the following activities: Preparing to see the patient (review of tests), Obtaining and/or reviewing separately obtained history (admission/discharge record), Performing a medically appropriate examination and/or evaluation , Ordering medications/tests/procedures, referring and communicating with other health care professionals, Documenting clinical information in the EMR, Independently interpreting results (not separately reported), Communicating results to the patient/family/caregiver, Counseling and educating the patient/family/caregiver and Care coordination (not separately reported).    Electronically signed by:  Odette Fraction, MD Infectious Disease Physician [facility] [address] [place], [place] [address] Phone: [phone]  Fax: [phone]

## 2022-09-24 ENCOUNTER — Other Ambulatory Visit: Payer: Self-pay

## 2022-09-24 ENCOUNTER — Ambulatory Visit: Payer: BC Managed Care – PPO | Admitting: Infectious Diseases

## 2022-09-24 ENCOUNTER — Ambulatory Visit: Payer: BC Managed Care – PPO

## 2022-09-24 ENCOUNTER — Encounter: Payer: Self-pay | Admitting: Infectious Diseases

## 2022-09-24 ENCOUNTER — Ambulatory Visit (INDEPENDENT_AMBULATORY_CARE_PROVIDER_SITE_OTHER): Payer: BC Managed Care – PPO | Admitting: Pharmacist

## 2022-09-24 ENCOUNTER — Other Ambulatory Visit (HOSPITAL_COMMUNITY): Payer: Self-pay

## 2022-09-24 ENCOUNTER — Other Ambulatory Visit (HOSPITAL_COMMUNITY)
Admission: RE | Admit: 2022-09-24 | Discharge: 2022-09-24 | Disposition: A | Discharge status: Home / Self Care | Payer: BC Managed Care – PPO | Source: Ambulatory Visit | Attending: Infectious Diseases | Admitting: Infectious Diseases

## 2022-09-24 ENCOUNTER — Telehealth: Payer: Self-pay

## 2022-09-24 VITALS — BP 120/78 | HR 72 | Temp 97.8°F | Ht 73.0 in | Wt 198.0 lb

## 2022-09-24 DIAGNOSIS — Z79899 Other long term (current) drug therapy: Secondary | ICD-10-CM

## 2022-09-24 DIAGNOSIS — Z113 Encounter for screening for infections with a predominantly sexual mode of transmission: Secondary | ICD-10-CM | POA: Insufficient documentation

## 2022-09-24 DIAGNOSIS — B20 Human immunodeficiency virus [HIV] disease: Secondary | ICD-10-CM

## 2022-09-24 DIAGNOSIS — R61 Generalized hyperhidrosis: Secondary | ICD-10-CM | POA: Diagnosis not present

## 2022-09-24 DIAGNOSIS — Z0000 Encounter for general adult medical examination without abnormal findings: Secondary | ICD-10-CM

## 2022-09-24 DIAGNOSIS — Z1159 Encounter for screening for other viral diseases: Secondary | ICD-10-CM | POA: Insufficient documentation

## 2022-09-24 MED ORDER — BICTEGRAVIR-EMTRICITAB-TENOFOV 50-200-25 MG PO TABS: 1.0000 | ORAL_TABLET | Freq: Every day | ORAL | 5 refills | Status: DC

## 2022-09-24 MED ORDER — BICTEGRAVIR-EMTRICITAB-TENOFOV 50-200-25 MG PO TABS
1.0000 | ORAL_TABLET | Freq: Every day | ORAL | 0 refills | Status: AC
Start: 2022-09-24 — End: 2022-10-01

## 2022-09-24 MED ORDER — BICTEGRAVIR-EMTRICITAB-TENOFOV 50-200-25 MG PO TABS: 1.0000 | ORAL_TABLET | Freq: Every day | ORAL | 0 refills | Status: DC

## 2022-09-24 NOTE — Telephone Encounter (Signed)
[facility] Patient Advocate Encounter   Was successful in obtaining a [place] copay card for [employer].  This copay card will make the patients copay $0.00.  I have spoken with the patient.    The billing information is as follows and has been shared with [facility].          Clearance Coots, CPhT Specialty Pharmacy Patient [facility] Phone: [phone] Fax:  [phone]

## 2022-09-24 NOTE — Progress Notes (Signed)
Medication Samples have been provided to the patient.  Drug name: Biktarvy        Strength: 50/200/25 mg       Qty: 7 tablets (1 bottles) LOT: CPP2HA   Exp.Date: 8/26  Dosing instructions: Take one tablet by mouth once daily  The patient has been instructed regarding the correct time, dose, and frequency of taking this medication, including desired effects and most common side effects.   Russell Engelstad, PharmD, CPP, BCIDP, AAHIVP Clinical Pharmacist Practitioner Infectious Diseases Clinical Pharmacist [facility]   

## 2022-09-24 NOTE — Addendum Note (Signed)
Addended by: Jennette Kettle on: 09/24/2022 11:21 AM   Modules accepted: Orders

## 2022-09-24 NOTE — Progress Notes (Addendum)
HPI: [place] is a 37 y.o. adult who presents to the [facility] clinic today to initiate care for a newly diagnosed HIV infection.  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   Substance abuse (HCC) 10/10/2013   Borderline personality disorder (HCC) 10/10/2013    Patient's Medications   No medications on file    Allergies: Allergies  Allergen Reactions   Amoxicillin Anaphylaxis and Hives    Has patient had a PCN reaction causing immediate rash, facial/tongue/throat swelling, SOB or lightheadedness with hypotension: Yes Has patient had a PCN reaction causing severe rash involving mucus membranes or skin necrosis: Yes Has patient had a PCN reaction that required hospitalization: ED visit Has patient had a PCN reaction occurring within the last 10 years: Yes, in 2015 If all of the above answers are "NO", then may proceed with Cephalosporin use.     Past Medical History: Past Medical History:  Diagnosis Date   Hormone replacement therapy    Migraine    Transexualism     Social History: Social History   Socioeconomic History   Marital status: Single    Spouse name: Not on file   Number of children: Not on file   Years of education: Not on file   Highest education level: Not on file  Occupational History   Not on file  Tobacco Use   Smoking status: Former    Current packs/day: 1.00    Types: Cigarettes   Smokeless tobacco: Never  Vaping Use   Vaping status: Never Used  Substance and Sexual Activity   Alcohol use: Not Currently   Drug use: Not Currently    Types: Marijuana   Sexual activity: Not Currently   Other Topics Concern   Not on file  Social History Narrative   Not on file   Social Determinants of Health   Financial Resource Strain: Not on file  Food Insecurity: Not on file  Transportation Needs: Not on file  Physical Activity: Not on file  Stress: Not on file  Social Connections: Not on file    Labs: No results found for: "HIV1RNAQUANT", "HIV1RNAVL", "CD4TABS"  RPR and STI Lab Results  Component Value Date   LABRPR NON REACTIVE 08/19/2021   LABRPR NON REACTIVE 05/07/2021   LABRPR NON REACTIVE 12/25/2019   LABRPR Non Reactive 07/31/2017   LABRPR Non Reactive 01/24/2015    STI Results GC CT  08/19/2021 11:13 AM Negative  Negative   05/07/2021  2:01 PM Negative  Negative   12/25/2019 12:55 PM Negative  Negative   05/17/2019 12:00 AM Negative  Negative   04/14/2019 12:00 AM Negative  Negative   02/06/2019 12:00 AM Negative  Negative   10/11/2018 12:00 AM Negative  Negative   09/29/2017 12:00 AM Negative  Negative   07/31/2017 12:00 AM Negative  Negative   05/22/2017 12:00 AM Negative  Negative   01/13/2016 12:00 AM Negative  Negative   01/05/2012  5:42 PM  NEGATIVE     Hepatitis B No results found for: "HEPBSAB", "HEPBSAG", "HEPBCAB" Hepatitis C No results found for: "HEPCAB", "HCVRNAPCRQN" Hepatitis A No results found for: "HAV" Lipids: No results found for: "CHOL", "TRIG", "  HDL", "CHOLHDL", "VLDL", "LDLCALC"  Current HIV Regimen: Treatment naive  Assessment: [place] is here today to initiate care with Dr. Elinor [place] for his newly diagnosed HIV infection.  He is treatment naive with unknown initial HIV viral load and a CD4 count. Will start Biktarvy today. Reviewed he had an elevated Scr last month which was likely due to dehydration and potentially acute HIV; will recheck today to ensure it has returned to baseline. Even with an Scr of 1.55 at baseline, his CrCl would be ~80 ml/min, and Biktarvy is still appropriate to initiate. Also provided patient with  7 days of samples pending where he fills his Biktarvy; his Biktarvy is $0 and must be filled through a [facility] pharmacy.   Explained that [place] is a one pill once daily medication with or without food and the importance of not missing any doses. Explained resistance and how it develops and why it is so important to take Biktarvy daily and not skip days or doses. Counseled patient to take it around the same time each day. Counseled on what to do if dose is missed, if closer to missed dose take immediately, if closer to next dose then skip and resume normal schedule. Cautioned on possible side effects the first week or so including nausea, diarrhea, dizziness, and headaches but that they should resolve after the first couple of weeks. I reviewed patient medications and found one drug interaction with OTC zinc. Counseled patient to separate Biktarvy from divalent cations including multivitamins. Discussed with patient to call clinic if he starts a new medication or herbal supplement. I gave the patient my card and told him to call me with any issues/questions/concerns.   Plan: - Start Biktarvy  - Counseled patient on [place] - Check BMP  [place], PharmD, CPP, BCIDP, AAHIVP Clinical Pharmacist Practitioner Infectious Diseases Clinical Pharmacist [facility] 09/24/2022, 11:17 AM

## 2022-09-25 LAB — CYTOLOGY, (ORAL, ANAL, URETHRAL) ANCILLARY ONLY
Chlamydia: NEGATIVE
Chlamydia: NEGATIVE
Comment: NEGATIVE
Comment: NEGATIVE
Comment: NORMAL
Comment: NORMAL
Neisseria Gonorrhea: NEGATIVE
Neisseria Gonorrhea: NEGATIVE

## 2022-09-25 LAB — T-HELPER CELLS (CD4) COUNT (NOT AT ARMC)
CD4 % Helper T Cell: 16 % — ABNORMAL LOW (ref 33–65)
CD4 T Cell Abs: 197 /uL — ABNORMAL LOW (ref 400–1790)

## 2022-09-27 DIAGNOSIS — R61 Generalized hyperhidrosis: Secondary | ICD-10-CM | POA: Insufficient documentation

## 2022-09-27 DIAGNOSIS — Z0000 Encounter for general adult medical examination without abnormal findings: Secondary | ICD-10-CM | POA: Insufficient documentation

## 2022-09-30 LAB — CULTURE, BLOOD (SINGLE)
MICRO NUMBER:: 15189167
Result:: NO GROWTH
SPECIMEN QUALITY:: ADEQUATE

## 2022-10-01 ENCOUNTER — Encounter: Payer: Self-pay | Admitting: Infectious Diseases

## 2022-10-06 LAB — CULTURE, BLOOD (SINGLE)
MICRO NUMBER:: 15189166
Result:: NO GROWTH
SPECIMEN QUALITY:: ADEQUATE

## 2022-10-06 LAB — HIV-1 INTEGRASE GENOTYPE

## 2022-10-06 LAB — HEPATITIS C ANTIBODY: Hepatitis C Ab: NONREACTIVE

## 2022-10-06 LAB — HIV RNA, RTPCR W/R GT (RTI, PI,INT)
HIV 1 RNA Quant: 1040000 {copies}/mL — ABNORMAL HIGH
HIV-1 RNA Quant, Log: 6.02 {Log_copies}/mL — ABNORMAL HIGH

## 2022-10-06 LAB — HEPATITIS B SURFACE ANTIGEN: Hepatitis B Surface Ag: NONREACTIVE

## 2022-10-06 LAB — HIV-1 GENOTYPE: HIV-1 Genotype: DETECTED — AB

## 2022-10-06 LAB — QUANTIFERON-TB GOLD PLUS
Mitogen-NIL: >10 [IU]/mL
NIL: 0.06 [IU]/mL
QuantiFERON-TB Gold Plus: NEGATIVE
TB1-NIL: 0 [IU]/mL
TB2-NIL: 0.01 [IU]/mL

## 2022-10-06 LAB — HEPATITIS B CORE ANTIBODY, TOTAL: Hep B Core Total Ab: REACTIVE — AB

## 2022-10-06 LAB — HIV-1/2 AB - DIFFERENTIATION
HIV-1 antibody: POSITIVE — AB
HIV-2 Ab: NEGATIVE

## 2022-10-06 LAB — HLA B*5701: HLA-B*5701 w/rflx HLA-B High: NEGATIVE

## 2022-10-06 LAB — HEPATITIS A ANTIBODY, TOTAL: Hepatitis A AB,Total: NONREACTIVE

## 2022-10-06 LAB — HIV ANTIBODY (ROUTINE TESTING W REFLEX): HIV 1&2 Ab, 4th Generation: REACTIVE — AB

## 2022-10-06 LAB — HEPATITIS B SURFACE ANTIBODY,QUALITATIVE: Hep B S Ab: REACTIVE — AB

## 2022-11-03 ENCOUNTER — Ambulatory Visit (INDEPENDENT_AMBULATORY_CARE_PROVIDER_SITE_OTHER): Payer: BC Managed Care – PPO | Admitting: Infectious Diseases

## 2022-11-03 ENCOUNTER — Encounter: Payer: Self-pay | Admitting: Infectious Diseases

## 2022-11-03 ENCOUNTER — Other Ambulatory Visit: Payer: Self-pay

## 2022-11-03 VITALS — BP 112/71 | HR 60 | Temp 98.0°F | Ht 73.0 in | Wt 196.0 lb

## 2022-11-03 DIAGNOSIS — Z79899 Other long term (current) drug therapy: Secondary | ICD-10-CM

## 2022-11-03 DIAGNOSIS — B20 Human immunodeficiency virus [HIV] disease: Secondary | ICD-10-CM

## 2022-11-03 DIAGNOSIS — Z0000 Encounter for general adult medical examination without abnormal findings: Secondary | ICD-10-CM

## 2022-11-03 NOTE — Progress Notes (Unsigned)
[address], [place], [place], [address]                                                                  Phn. [phone]; Fax: [phone]                                                                             Date: 11/03/22  Reason for Visit: HIV Follow Up   HPI: [place] is a 37 y.o.old adult with new diagnosis of HIV, substance abuse, depression referred for evaluation and management. 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 Denies being on any hormonal supplements currently.   Seen in the ED on 6/16 for fevers with unclear source as well as headache. Work up was unremarkable with mild aki treated with IVF and was discharged from ED with PCP fu. He saw his PCP 2 days later who did lab work and was found to have HIV. All of his prior symptoms have resolved including headache however he reports intermittent constipation for which he is increasing fiber in diet and taking laxatives. Complains of drenching night swears for couple of weeks but denies fevers, chills.  Denies nausea, vomiting, abdominal pain, diarrhea, loss of appetite and weight loss. Denies SOB, cough and chest pain. Denies GU complaints.   Discussed about enrolling in research for HIV vs starting of ART today to which he would like the latter and get treatment started.   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 Night sweats has resolved. No new changes from last visit. No complaints.   ROS: Denies fevers, chills. Denies nausea, vomiting, abdominal pain, diarrhea, loss of appetite and weight loss, chills. Denies SOB, cough and chest pain. Denies GU complaints. Denies recent hospitalizations. Denies rashes, joint complaints, headaches/neurological complaints   Allergies  Allergen Reactions   Amoxicillin Anaphylaxis and Hives    Has patient had a PCN reaction causing immediate rash, facial/tongue/throat swelling, SOB or lightheadedness with hypotension: Yes Has patient had a PCN reaction causing severe rash involving mucus membranes or skin necrosis: Yes Has patient had a PCN reaction that required hospitalization: ED visit Has patient had  a PCN reaction occurring within the last 10 years: Yes, in 2015 If all of the above answers are "NO", then may proceed with Cephalosporin use.    Past Medical History:  Diagnosis Date   Hormone replacement therapy    Migraine    Transexualism     Past Surgical History:  Procedure Laterality Date   COSMETIC SURGERY     rhinoplasty   RHINOPLASTY     Social History   Socioeconomic History   Marital status: Single    Spouse name: Not on file   Number of children: Not on file   Years of education: Not on file   Highest education level: Not on file  Occupational History   Not on file  Tobacco Use   Smoking status: Former    Current packs/day: 1.00    Types: Cigarettes   Smokeless tobacco: Never  Vaping Use   Vaping status: Never Used  Substance and Sexual Activity   Alcohol use: Not Currently   Drug use: Not Currently    Types: Marijuana   Sexual activity: Not Currently  Other Topics Concern   Not on file  Social History Narrative   Not on file   Social Determinants of Health   Financial Resource Strain: Not on file  Food Insecurity: Not on file   Transportation Needs: Not on file  Physical Activity: Not on file  Stress: Not on file  Social Connections: Not on file  Intimate Partner Violence: Not on file   Family History  Problem Relation Age of Onset   Diabetes Mother     Vitals  There were no vitals taken for this visit.  Examination  Gen: Alert and oriented x 3, no acute distress HEENT: [place]/AT, no scleral icterus, no pale conjunctivae, hearing normal, oral mucosa moist Neck: Supple Cardio: Regular rate and rhythm; +S1 and S2 Resp: CTAB GI: nondistended GU: Musc: Extremities: No pedal edema Skin: No rashes Neuro: grossly non focal, awake, alert and oriented, follows commands. ambulatory Psych: Calm, cooperative  Lab Results HIV 1 RNA Quant (copies/mL)  Date Value  09/24/2022 1,040,000 (H)   CD4 T Cell Abs (/uL)  Date Value  09/24/2022 197 (L)   No results found for: "HIV1GENOSEQ" Lab Results  Component Value Date   WBC 4.8 08/30/2022   HGB 14.4 08/30/2022   HCT 42.4 08/30/2022   MCV 89.8 08/30/2022   PLT 116 (L) 08/30/2022    Lab Results  Component Value Date   CREATININE 1.55 (H) 08/30/2022   BUN 14 08/30/2022   NA 136 08/30/2022   K 3.5 08/30/2022   CL 102 08/30/2022   CO2 25 08/30/2022   Lab Results  Component Value Date   ALT 16 08/30/2022   AST 21 08/30/2022   ALKPHOS 27 (L) 08/30/2022   BILITOT 0.7 08/30/2022    No results found for: "CHOL", "TRIG", "HDL", "LDLCALC" Lab Results  Component Value Date   HAV NON-REACTIVE 09/24/2022   Lab Results  Component Value Date   HEPBSAG NON-REACTIVE 09/24/2022   HEPBSAB REACTIVE (A) 09/24/2022   No results found for: "HCVAB" Lab Results  Component Value Date   CHLAMYDIAWP Negative 09/24/2022   CHLAMYDIAWP Negative 09/24/2022   N Negative 09/24/2022   N Negative 09/24/2022   No results found for: "GCPROBEAPT" No results found for: "QUANTGOLD"    Health Maintenance:  There is no immunization history on file for this  patient.  Assessment/Plan: # HIV Continue Biktarvy HIV RNA today Pharmacy to see Fu in  6 week  # Night sweats  2 sets of peripheral blood cultures  # STD Screening  7/11 urine,   #Immunization  Will plan once CD4 more than 200  # Health Maintenance: Dental referral was placed  Quantiferon negative 09/24/22 Hepatitis A ab NR 09/24/22 Hepatitis B surface ag 09/24/22 HCV ab NR 09/24/22   Patient's labs were reviewed as well as his previous records. Patients questions were addressed and answered. Safe sex counseling done.   I have personally spent 68  minutes involved in face-to-face and non-face-to-face activities for this patient on the day of the visit. Professional time spent includes the following activities: Preparing to see the patient (review of tests), Obtaining and/or reviewing separately obtained history (admission/discharge record), Performing a medically appropriate examination and/or evaluation , Ordering medications/tests/procedures, referring and communicating with other health care professionals, Documenting clinical information in the EMR, Independently interpreting results (not separately reported), Communicating results to the patient/family/caregiver, Counseling and educating the patient/family/caregiver and Care coordination (not separately reported).    Electronically signed by:  Odette Fraction, MD Infectious Disease Physician [facility] [address] [place], [place] [address] Phone: [phone]  Fax: [phone]

## 2022-11-07 LAB — HIV RNA, RTPCR W/R GT (RTI, PI,INT)
HIV 1 RNA Quant: 390 {copies}/mL — ABNORMAL HIGH
HIV-1 RNA Quant, Log: 2.59 {Log_copies}/mL — ABNORMAL HIGH

## 2022-11-09 ENCOUNTER — Telehealth: Payer: Self-pay

## 2022-11-09 NOTE — Telephone Encounter (Signed)
Patient aware of results and voiced his understanding. ° °Tiffany P Speight, CMA ° °

## 2022-11-09 NOTE — Telephone Encounter (Signed)
-----   Message from Victoriano Lain sent at 11/09/2022  6:31 AM EDT ----- Please inform him VL elevated at 390, was started on ART and plan to check VL again in 6-8 weeks.

## 2022-12-29 ENCOUNTER — Other Ambulatory Visit: Payer: Self-pay

## 2022-12-29 ENCOUNTER — Encounter: Payer: Self-pay | Admitting: Infectious Diseases

## 2022-12-29 ENCOUNTER — Ambulatory Visit (INDEPENDENT_AMBULATORY_CARE_PROVIDER_SITE_OTHER): Payer: BC Managed Care – PPO | Admitting: Infectious Diseases

## 2022-12-29 ENCOUNTER — Other Ambulatory Visit (HOSPITAL_COMMUNITY)
Admission: RE | Admit: 2022-12-29 | Discharge: 2022-12-29 | Disposition: A | Discharge status: Home / Self Care | Payer: BC Managed Care – PPO | Source: Ambulatory Visit | Attending: Infectious Diseases | Admitting: Infectious Diseases

## 2022-12-29 VITALS — BP 116/74 | HR 51 | Temp 98.4°F | Wt 201.0 lb

## 2022-12-29 DIAGNOSIS — R21 Rash and other nonspecific skin eruption: Secondary | ICD-10-CM | POA: Diagnosis not present

## 2022-12-29 DIAGNOSIS — Z0000 Encounter for general adult medical examination without abnormal findings: Secondary | ICD-10-CM

## 2022-12-29 DIAGNOSIS — Z79899 Other long term (current) drug therapy: Secondary | ICD-10-CM | POA: Diagnosis not present

## 2022-12-29 DIAGNOSIS — B20 Human immunodeficiency virus [HIV] disease: Secondary | ICD-10-CM | POA: Insufficient documentation

## 2022-12-29 MED ORDER — SULFAMETHOXAZOLE-TRIMETHOPRIM 400-80 MG PO TABS: 1.0000 | ORAL_TABLET | Freq: Every day | ORAL | 2 refills | Status: DC

## 2022-12-29 NOTE — Progress Notes (Signed)
[address], [place], [place], [address]                                                                  Phn. [phone]; Fax: [phone]                                                                             Date: 12/29/22  Reason for Visit: HIV Follow Up   HPI: Frederick Hess is a 37 y.o.old adult with HIV, substance abuse, depression referred for evaluation and management. 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 Refused vaccines, Willing to do anal pap today. Would like to see dermatology for patches in his back, unclear onset, not painful, non itchy. Denies similar patches in other sites of the body. No complaints otherwise.   ROS: Denies fevers, chills. Denies nausea, vomiting, abdominal pain, diarrhea, loss of appetite and weight loss. Denies SOB, cough and chest pain. Denies GU complaints. Denies recent hospitalizations. Denies oral ulcers, joint complaints, headaches/neurological complaints   Allergies  Allergen Reactions   Amoxicillin Anaphylaxis and Hives    Has patient had a PCN reaction causing immediate rash, facial/tongue/throat swelling, SOB or lightheadedness with hypotension: Yes Has patient had a PCN reaction causing severe rash involving mucus membranes or skin necrosis: Yes Has patient had a PCN reaction that required hospitalization: ED visit Has patient had a PCN reaction occurring within the last 10 years: Yes, in 2015 If all of the above answers are "NO", then may proceed with Cephalosporin use.    Past Medical History:  Diagnosis Date   Hormone replacement  therapy    Migraine    Transexualism     Past Surgical History:  Procedure Laterality Date   COSMETIC SURGERY     rhinoplasty   RHINOPLASTY     Social History   Socioeconomic History   Marital status: Single    Spouse name: Not on file   Number of children: Not on file   Years of education: Not on file   Highest education level: Not on file  Occupational History   Not on file  Tobacco Use   Smoking status: Former    Current packs/day: 1.00    Types: Cigarettes   Smokeless tobacco: Never  Vaping Use   Vaping status: Never Used  Substance and Sexual Activity   Alcohol use: Not Currently   Drug use: Not Currently    Types:  Marijuana   Sexual activity: Not Currently  Other Topics Concern   Not on file  Social History Narrative   Not on file   Social Determinants of Health   Financial Resource Strain: Not on file  Food Insecurity: Not on file  Transportation Needs: Not on file  Physical Activity: Not on file  Stress: Not on file  Social Connections: Not on file  Intimate Partner Violence: Not on file   Family History  Problem Relation Age of Onset   Diabetes Mother     Vitals  BP 116/74   Pulse (!) 51   Temp 98.4 F (36.9 C) (Oral)   Wt 201 lb (91.2 kg)   SpO2 98%   BMI 26.52 kg/m     Examination  Gen: Alert and oriented x 3, no acute distress HEENT: [place]/AT, no scleral icterus, no pale conjunctivae, hearing normal, oral mucosa moist Neck: Supple Cardio: Regular rate and rhythm Resp: Pulmonary effort normal on room air GI: nondistended GU: Musc: Extremities: No pedal edema Skin: chronic appearing skin patches in the back, non elevated, non tender, non cellulitic  Neuro: grossly non focal, awake, alert and oriented, follows commands. ambulatory Psych: Calm, cooperative  Lab Results HIV 1 RNA Quant (copies/mL)  Date Value  11/03/2022 390 (H)  09/24/2022 1,040,000 (H)   CD4 T Cell Abs (/uL)  Date Value  09/24/2022 197 (L)   No results  found for: "HIV1GENOSEQ" Lab Results  Component Value Date   WBC 4.8 08/30/2022   HGB 14.4 08/30/2022   HCT 42.4 08/30/2022   MCV 89.8 08/30/2022   PLT 116 (L) 08/30/2022    Lab Results  Component Value Date   CREATININE 1.55 (H) 08/30/2022   BUN 14 08/30/2022   NA 136 08/30/2022   K 3.5 08/30/2022   CL 102 08/30/2022   CO2 25 08/30/2022   Lab Results  Component Value Date   ALT 16 08/30/2022   AST 21 08/30/2022   ALKPHOS 27 (L) 08/30/2022   BILITOT 0.7 08/30/2022    No results found for: "CHOL", "TRIG", "HDL", "LDLCALC" Lab Results  Component Value Date   HAV NON-REACTIVE 09/24/2022   Lab Results  Component Value Date   HEPBSAG NON-REACTIVE 09/24/2022   HEPBSAB REACTIVE (A) 09/24/2022   No results found for: "HCVAB" Lab Results  Component Value Date   CHLAMYDIAWP Negative 09/24/2022   CHLAMYDIAWP Negative 09/24/2022   N Negative 09/24/2022   N Negative 09/24/2022   No results found for: "GCPROBEAPT" No results found for: "QUANTGOLD"    Health Maintenance:  There is no immunization history on file for this patient.  Assessment/Plan: # HIV/AIDs - Continue Biktarvy 1 tab po daily  - Will start Bactrim SS 1 tab po daily for PJP ppx  - HIV RNA today - Fu in 6 -8 week  # Rash in the bak - appears to be chronic process and non infective  - amb referral to dermatology   #Immunization  - Refused vaccines   # Health Maintenance: Anal pap today  Dental referral has been placed Quantiferon negative 09/24/22 Hepatitis A ab NR 09/24/22 Hepatitis B surface ag 09/24/22 HCV ab NR 09/24/22   Patient's labs were reviewed as well as his previous records. Patients questions were addressed and answered. Safe sex counseling done.   I have personally spent 42  minutes involved in face-to-face and non-face-to-face activities for this patient on the day of the visit. Professional time spent includes the following activities: Preparing to see the  patient (review of  tests), Obtaining and/or reviewing separately obtained history (admission/discharge record), Performing a medically appropriate examination and/or evaluation , Ordering medications/tests/procedures, referring and communicating with other health care professionals, Documenting clinical information in the EMR, Independently interpreting results (not separately reported), Communicating results to the patient/family/caregiver, Counseling and educating the patient/family/caregiver and Care coordination (not separately reported).    Electronically signed by:  Odette Fraction, MD Infectious Disease Physician [facility] [address] [place], [place] [address] Phone: [phone]  Fax: [phone]

## 2022-12-30 NOTE — Telephone Encounter (Signed)
Attempted to call patient regarding Bactrim prescription. Not able to reach patient at either number in chart. Mychart message sent 10/15.  Juanita Laster, RMA

## 2023-01-01 LAB — CYTOLOGY - PAP
Comment: NEGATIVE
Diagnosis: UNDETERMINED — AB
High risk HPV: NEGATIVE

## 2023-01-01 LAB — HIV RNA, RTPCR W/R GT (RTI, PI,INT)
HIV 1 RNA Quant: 122 {copies}/mL — ABNORMAL HIGH
HIV-1 RNA Quant, Log: 2.09 {Log} — ABNORMAL HIGH

## 2023-01-04 ENCOUNTER — Telehealth: Payer: Self-pay

## 2023-01-04 ENCOUNTER — Other Ambulatory Visit: Payer: Self-pay | Admitting: Infectious Diseases

## 2023-01-04 DIAGNOSIS — R85619 Unspecified abnormal cytological findings in specimens from anus: Secondary | ICD-10-CM

## 2023-01-04 DIAGNOSIS — B20 Human immunodeficiency virus [HIV] disease: Secondary | ICD-10-CM

## 2023-01-04 NOTE — Telephone Encounter (Signed)
-----   Message from Victoriano Lain sent at 01/04/2023  6:12 AM EDT ----- Please let patient know VL is trending down, now to 122, continue taking ART as prescribed   Anal pap is abnormal with atypical squamous cells. I will place a referral for him to be seen by colorectal surgery

## 2023-01-12 ENCOUNTER — Telehealth: Payer: Self-pay

## 2023-01-12 NOTE — Telephone Encounter (Signed)
Patient called requesting appointment on Thursday due to blood in urine and abdominal pain x 1 day. We do not have any availability tomorrow or Thursday. Advised patient he may need to go to [facility] or ED to be evaluated. Patient agreeable to go to UC.    Jeremyah Jelley Lesli Albee, CMA

## 2023-01-18 MED ORDER — BICTEGRAVIR-EMTRICITAB-TENOFOV 50-200-25 MG PO TABS: 1.0000 | ORAL_TABLET | Freq: Every day | ORAL | 3 refills | Status: DC

## 2023-01-18 NOTE — Addendum Note (Signed)
Addended by: Linna Hoff D on: 01/18/2023 01:14 PM   Modules accepted: Orders

## 2023-01-18 NOTE — Telephone Encounter (Signed)
Patient called and provided clinic with his new number.   Relayed viral load is down to 122 which is technically undetectable as it is less than 200. Encouraged continued adherence to [employer].   Discussed anal pap results and need for further evaluation. Patient verbalized understanding and has no further questions.   Sandie Ano, RN

## 2023-01-18 NOTE — Telephone Encounter (Signed)
Have been unable to get in touch with patient, letter mailed to address on file.   Sandie Ano, RN

## 2023-01-25 ENCOUNTER — Other Ambulatory Visit (HOSPITAL_COMMUNITY): Payer: Self-pay

## 2023-01-26 ENCOUNTER — Other Ambulatory Visit: Payer: Self-pay

## 2023-01-26 ENCOUNTER — Ambulatory Visit: Payer: 59

## 2023-01-27 ENCOUNTER — Other Ambulatory Visit: Payer: Self-pay | Admitting: Pharmacist

## 2023-01-27 DIAGNOSIS — B20 Human immunodeficiency virus [HIV] disease: Secondary | ICD-10-CM

## 2023-01-27 MED ORDER — BIKTARVY 50-200-25 MG PO TABS
1.0000 | ORAL_TABLET | Freq: Every day | ORAL | Status: AC
Start: 2023-01-26 — End: 2023-02-02

## 2023-01-27 NOTE — Progress Notes (Signed)
Medication Samples have been provided to the patient.  Drug name: Biktarvy        Strength: 50/200/25 mg       Qty: 1 bottle (7 tablets)   LOT: [id number]   Exp.Date: 12/2024  Dosing instructions: Take one tablet by mouth once daily  The patient has been instructed regarding the correct time, dose, and frequency of taking this medication, including desired effects and most common side effects.   Shamere Campas L. Jannette Fogo, PharmD, BCIDP, AAHIVP, CPP Clinical Pharmacist Practitioner Infectious Diseases Clinical Pharmacist [facility] 02/26/2020, 10:07 AM

## 2023-02-03 ENCOUNTER — Other Ambulatory Visit (HOSPITAL_COMMUNITY): Payer: Self-pay

## 2023-02-09 ENCOUNTER — Ambulatory Visit: Payer: Medicaid Other | Admitting: Infectious Diseases

## 2023-02-09 ENCOUNTER — Other Ambulatory Visit (HOSPITAL_COMMUNITY): Payer: Self-pay

## 2023-02-25 ENCOUNTER — Other Ambulatory Visit (HOSPITAL_COMMUNITY)
Admission: RE | Admit: 2023-02-25 | Discharge: 2023-02-25 | Disposition: A | Discharge status: Home / Self Care | Payer: Medicaid Other | Source: Ambulatory Visit | Attending: Infectious Diseases | Admitting: Infectious Diseases

## 2023-02-25 ENCOUNTER — Telehealth: Payer: Self-pay

## 2023-02-25 ENCOUNTER — Encounter: Payer: Self-pay | Admitting: Infectious Diseases

## 2023-02-25 ENCOUNTER — Ambulatory Visit: Payer: Medicaid Other | Admitting: Infectious Diseases

## 2023-02-25 ENCOUNTER — Other Ambulatory Visit: Payer: Self-pay

## 2023-02-25 VITALS — BP 113/75 | HR 74 | Temp 97.7°F | Ht 73.0 in | Wt 200.0 lb

## 2023-02-25 DIAGNOSIS — R319 Hematuria, unspecified: Secondary | ICD-10-CM | POA: Diagnosis not present

## 2023-02-25 DIAGNOSIS — Z113 Encounter for screening for infections with a predominantly sexual mode of transmission: Secondary | ICD-10-CM | POA: Insufficient documentation

## 2023-02-25 DIAGNOSIS — R300 Dysuria: Secondary | ICD-10-CM | POA: Insufficient documentation

## 2023-02-25 DIAGNOSIS — B20 Human immunodeficiency virus [HIV] disease: Secondary | ICD-10-CM | POA: Diagnosis not present

## 2023-02-25 DIAGNOSIS — R361 Hematospermia: Secondary | ICD-10-CM

## 2023-02-25 MED ORDER — SULFAMETHOXAZOLE-TRIMETHOPRIM 400-80 MG PO TABS: 1.0000 | ORAL_TABLET | Freq: Every day | ORAL | 2 refills | Status: DC

## 2023-02-25 MED ORDER — DOXYCYCLINE HYCLATE 100 MG PO TABS: 100.0000 mg | ORAL_TABLET | Freq: Two times a day (BID) | ORAL | 0 refills | Status: DC

## 2023-02-25 MED ORDER — CEFTRIAXONE SODIUM 500 MG IJ SOLR
500.0000 mg | Freq: Once | INTRAMUSCULAR | Status: AC
  Administered 2023-02-25: 500 mg via INTRAMUSCULAR

## 2023-02-25 NOTE — Telephone Encounter (Signed)
[facility] called with concerns of bilateral lower back pain, dysuria, and slight hematuria for the last week. Denies fever or chills. Denies any new sexual partners or penile discharge. Reports blood has been present during both urination and ejaculation. Accepts appointment for this afternoon to evaluate for possible STI or UTI.   Sandie Ano, RN

## 2023-02-25 NOTE — Progress Notes (Addendum)
[address], [place], [place], [address]                                                                  Phn. [phone]; Fax: [phone]                                                                             Date: 02/25/23  Reason for Visit: GU complaints  HPI: Frederick Hess is a 37 y.o.old adult with HIV, substance abuse, depression referred for evaluation and management. 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   12/12 Reports noticing blood with urine as well as ejaculating for last one week. He had sexual encounter with a random male at a club 2.5 weeks ago when he was drunk. He also has some urinary burning including mild suprapubic pain and back pain. Denies increased frequency or urgency. Denies fevers and chills. Denies nausea, vomiting, rashes or abnormal BM. Taking biktarvy, reports never received bactrim from pharmacy.   ROS: Denies abdominal pain, diarrhea, loss of appetite and weight loss. Denies SOB, cough and chest pain. Denies recent hospitalizations. Denies oral ulcers, joint complaints, headaches/neurological complaints  Allergies  Allergen Reactions   Amoxicillin Anaphylaxis and Hives    Has patient had a PCN reaction causing immediate rash, facial/tongue/throat swelling, SOB or lightheadedness with hypotension: Yes Has patient had a PCN reaction causing severe rash involving mucus membranes or skin necrosis: Yes Has patient had a PCN reaction that required hospitalization: ED visit Has patient had a PCN reaction occurring within the last 10 years: Yes, in 2015 If all of the above  answers are "NO", then may proceed with Cephalosporin use.    Past Medical History:  Diagnosis Date   Hormone replacement therapy    Migraine    Transexualism     Past Surgical History:  Procedure Laterality Date   COSMETIC SURGERY     rhinoplasty   RHINOPLASTY     Social History   Socioeconomic History   Marital status: Single    Spouse name: Not on file   Number of children: Not on file   Years of education: Not on file   Highest education level: Not on file  Occupational History   Not on file  Tobacco Use   Smoking status: Former    Current packs/day: 1.00    Types: Cigarettes   Smokeless tobacco: Never  Vaping Use   Vaping status: Never Used  Substance  and Sexual Activity   Alcohol use: Not Currently   Drug use: Not Currently    Types: Marijuana   Sexual activity: Not Currently  Other Topics Concern   Not on file  Social History Narrative   Not on file   Social Drivers of Health   Financial Resource Strain: Not on file  Food Insecurity: Not on file  Transportation Needs: Not on file  Physical Activity: Not on file  Stress: Not on file (01/21/2023)  Social Connections: Not on file  Intimate Partner Violence: Not on file   Family History  Problem Relation Age of Onset   Diabetes Mother     Vitals  BP 113/75   Pulse 74   Temp 97.7 F (36.5 C) (Oral)   Ht 6\' 1"  (1.854 m)   Wt 200 lb (90.7 kg)   SpO2 98%   BMI 26.39 kg/m    Examination  Gen: Alert and oriented x 3, no acute distress HEENT: [place]/AT, no scleral icterus, no pale conjunctivae, hearing normal, oral mucosa moist Neck: Supple Cardio: Regular rate and rhythm Resp: Pulmonary effort normal on room air GI: nondistended GU ( examined with a chaperone)- penile and scrotal area wnl with no ulcer, discharge, sores . No inguinal lymph node swelling.  Musc: Extremities: No pedal edema Skin: no new changes  Neuro: grossly non focal, awake, alert and oriented, follows commands.  ambulatory Psych: Calm, cooperative  Lab Results HIV 1 RNA Quant (copies/mL)  Date Value  12/29/2022 122 (H)  11/03/2022 390 (H)  09/24/2022 1,040,000 (H)   CD4 T Cell Abs (/uL)  Date Value  09/24/2022 197 (L)   No results found for: "HIV1GENOSEQ" Lab Results  Component Value Date   WBC 4.8 08/30/2022   HGB 14.4 08/30/2022   HCT 42.4 08/30/2022   MCV 89.8 08/30/2022   PLT 116 (L) 08/30/2022    Lab Results  Component Value Date   CREATININE 1.55 (H) 08/30/2022   BUN 14 08/30/2022   NA 136 08/30/2022   K 3.5 08/30/2022   CL 102 08/30/2022   CO2 25 08/30/2022   Lab Results  Component Value Date   ALT 16 08/30/2022   AST 21 08/30/2022   ALKPHOS 27 (L) 08/30/2022   BILITOT 0.7 08/30/2022    No results found for: "CHOL", "TRIG", "HDL", "LDLCALC" Lab Results  Component Value Date   HAV NON-REACTIVE 09/24/2022   Lab Results  Component Value Date   HEPBSAG NON-REACTIVE 09/24/2022   HEPBSAB REACTIVE (A) 09/24/2022   No results found for: "HCVAB" Lab Results  Component Value Date   CHLAMYDIAWP Negative 09/24/2022   CHLAMYDIAWP Negative 09/24/2022   N Negative 09/24/2022   N Negative 09/24/2022   No results found for: "GCPROBEAPT" No results found for: "QUANTGOLD"    Health Maintenance:  There is no immunization history on file for this patient.  Assessment/Plan: # Dysuria/hematuria/hematospermia  - in the setting of recent sexual encounter with a new male partner - Will empirically treat for GC with ceftriaxone 500mg  IM one dose and PO doxycycline 100mg  po bid for 7 days  - Urine, oral, anal GC and RPR  - UA and urine cx for +/- need for abtx - Fu pending labs above   # HIV/AIDs - Continue Biktarvy 1 tab po daily  - Discussed to start taking bactrim 1ss po daily. Spoke with staff to contact pharmacy to make sure he is able to get bactrim  - HIV RNA today - Fu in 8 week  #  Health Maintenance: 10/15 anal pap asus- referred to colorectal surgery for  HRA Dental referral has been placed Quantiferon negative 09/24/22 Hepatitis A ab NR 09/24/22 Hepatitis B surface ag 09/24/22 HCV ab NR 09/24/22   Patient's labs were reviewed as well as his previous records. Patients questions were addressed and answered. Safe sex counseling done.   I have personally spent 42  minutes involved in face-to-face and non-face-to-face activities for this patient on the day of the visit. Professional time spent includes the following activities: Preparing to see the patient (review of tests), Obtaining and/or reviewing separately obtained history (admission/discharge record), Performing a medically appropriate examination and/or evaluation , Ordering medications/tests/procedures, referring and communicating with other health care professionals, Documenting clinical information in the EMR, Independently interpreting results (not separately reported), Communicating results to the patient/family/caregiver, Counseling and educating the patient/family/caregiver and Care coordination (not separately reported).    Electronically signed by:  Odette Fraction, MD Infectious Disease Physician [facility] for Infectious Disease [address] [place], [place] [address] Phone: [phone]  Fax: [phone]

## 2023-02-25 NOTE — Progress Notes (Signed)
Patient has allergy to amoxicillin. Per chart review, he has received ceftriaxone on multiple occasions without issue. Okay to proceed with ceftriaxone per Dr. Elinor Parkinson. Emergency precautions provided to patient.   Provider wanted to ensure that Bactrim is dispensed with patient's Biktarvy. Called [facility] to confirm this, they are closed for lunch. Left voicemail requesting they call the clinic back if there is any issue with dispensing the Bactrim.   Sandie Ano, RN

## 2023-02-26 LAB — CYTOLOGY, (ORAL, ANAL, URETHRAL) ANCILLARY ONLY
Chlamydia: NEGATIVE
Chlamydia: POSITIVE — AB
Comment: NEGATIVE
Comment: NEGATIVE
Comment: NORMAL
Comment: NORMAL
Neisseria Gonorrhea: NEGATIVE
Neisseria Gonorrhea: NEGATIVE

## 2023-02-26 LAB — URINE CYTOLOGY ANCILLARY ONLY
Chlamydia: NEGATIVE
Comment: NEGATIVE
Comment: NEGATIVE
Comment: NORMAL
Neisseria Gonorrhea: NEGATIVE
Trichomonas: NEGATIVE

## 2023-02-26 LAB — T-HELPER CELLS (CD4) COUNT (NOT AT ARMC)
CD4 % Helper T Cell: 28 % — ABNORMAL LOW (ref 33–65)
CD4 T Cell Abs: 351 /uL — ABNORMAL LOW (ref 400–1790)

## 2023-02-27 LAB — URINALYSIS, ROUTINE W REFLEX MICROSCOPIC
Bacteria, UA: NONE SEEN /[HPF]
Bilirubin Urine: NEGATIVE
Glucose, UA: NEGATIVE
Hgb urine dipstick: NEGATIVE
Hyaline Cast: NONE SEEN /[LPF]
Ketones, ur: NEGATIVE
Nitrite: NEGATIVE
Protein, ur: NEGATIVE
RBC / HPF: NONE SEEN /[HPF] (ref 0–2)
Specific Gravity, Urine: 1.015 (ref 1.001–1.035)
pH: 6.5 (ref 5.0–8.0)

## 2023-02-27 LAB — URINE CULTURE
MICRO NUMBER:: 15844006
Result:: NO GROWTH
SPECIMEN QUALITY:: ADEQUATE

## 2023-02-27 LAB — MICROSCOPIC MESSAGE

## 2023-02-28 LAB — HIV RNA, RTPCR W/R GT (RTI, PI,INT)
HIV 1 RNA Quant: 74 {copies}/mL — ABNORMAL HIGH
HIV-1 RNA Quant, Log: 1.87 {Log} — ABNORMAL HIGH

## 2023-02-28 LAB — RPR: RPR Ser Ql: NONREACTIVE

## 2023-03-01 ENCOUNTER — Telehealth: Payer: Self-pay

## 2023-03-01 ENCOUNTER — Other Ambulatory Visit: Payer: Self-pay

## 2023-03-01 DIAGNOSIS — R300 Dysuria: Secondary | ICD-10-CM

## 2023-03-01 MED ORDER — DOXYCYCLINE HYCLATE 100 MG PO TABS: 100.0000 mg | ORAL_TABLET | Freq: Two times a day (BID) | ORAL | 0 refills | Status: AC

## 2023-03-01 NOTE — Telephone Encounter (Signed)
-----   Message from Victoriano Lain sent at 03/01/2023  7:06 AM EST ----- Please let him know he has tested + for chlamydia, he was prescribed doxycycline 100mg  po bid last clinic visit and should complete the treatment, abstain from sexual intercourse for 7 days post completion of tx and also notifiy sexual partners for testing and treatment.   Urine with some elevated WBC and could be related to above, urine cx no growth   HIV RNA has gone down from 122 to 74 which is good as well as cd4 count has come up from 197 to 351  Continue biktarvy and bactrim as prescribed. Will plan to stop bactrim once he stays suppressed and cd4 stays more than 200.

## 2023-03-01 NOTE — Telephone Encounter (Signed)
Patient aware of results.   Felicita Nuncio P Chavonne Sforza, CMA  

## 2023-04-13 ENCOUNTER — Telehealth: Payer: Self-pay

## 2023-04-13 NOTE — Telephone Encounter (Signed)
Called patient to follow up on general surgery referral, he states no one has reached out to him. Will send him [facility] contact info and encouraged him to reach out for scheduling.   Sandie Ano, RN

## 2023-04-14 ENCOUNTER — Telehealth: Payer: Self-pay

## 2023-04-14 ENCOUNTER — Other Ambulatory Visit: Payer: Self-pay

## 2023-04-14 ENCOUNTER — Ambulatory Visit
Admission: EM | Admit: 2023-04-14 | Discharge: 2023-04-14 | Disposition: A | Discharge status: Home / Self Care | Payer: Medicaid Other | Attending: Family Medicine | Admitting: Family Medicine

## 2023-04-14 DIAGNOSIS — R300 Dysuria: Secondary | ICD-10-CM | POA: Diagnosis not present

## 2023-04-14 DIAGNOSIS — A749 Chlamydial infection, unspecified: Secondary | ICD-10-CM

## 2023-04-14 LAB — POCT URINALYSIS DIP (MANUAL ENTRY)
Bilirubin, UA: NEGATIVE
Blood, UA: NEGATIVE
Glucose, UA: NEGATIVE mg/dL
Ketones, POC UA: NEGATIVE mg/dL
Leukocytes, UA: NEGATIVE
Nitrite, UA: NEGATIVE
Protein Ur, POC: NEGATIVE mg/dL
Spec Grav, UA: 1.015 (ref 1.010–1.025)
Urobilinogen, UA: 0.2 U/dL
pH, UA: 5.5 (ref 5.0–8.0)

## 2023-04-14 MED ORDER — DOXYCYCLINE HYCLATE 100 MG PO CAPS: 100.0000 mg | ORAL_CAPSULE | Freq: Two times a day (BID) | ORAL | 0 refills | Status: AC

## 2023-04-14 NOTE — Telephone Encounter (Signed)
Patient called stating that at his last visit - he stated that he lost his antibiotics and is still having dysuria. Patient stated that he went to UC but didn't want to wait for 2 hours. Informed patient that we do not currently have any availability this morning. Patient stated that he would try another UC.    Kursten Kruk Lesli Albee, CMA

## 2023-04-14 NOTE — Discharge Instructions (Addendum)
Follow-up if your symptoms fail to improve or you develop new symptoms such as localized abdominal pain, fever, vomiting, blood in your urine, concerns

## 2023-04-14 NOTE — ED Provider Notes (Signed)
[facility]    CSN: [id number] Arrival date & time: 04/14/23  1055      History   Chief Complaint Chief Complaint  Patient presents with   Abdominal Pain    HPI Frederick Hess is a 38 y.o. adult.   The history is provided by the patient.  Abdominal Pain Associated symptoms: dysuria   Associated symptoms: no chills and no fever   Suprapubic pain for 3 to 4 days associated with dysuria, diagnosed with chlamydia 02/25/2023 states he received a shot but did not finish his pills as he left them in a hotel room.  Not had any sexual encounter since that treatment. Admits frequency but drinks a lot of water.  Denies hematuria, cloudy or malodorous urine, genital rashes or skin changes, testicular pain, nausea, vomiting, fever, chills, history of bladder infections or kidney stones. He is HIV positive currently taking Biktarvy and Bactrim.  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      Home Medications    Prior to Admission medications   Medication Sig Start Date End Date Taking? Authorizing Provider   bictegravir-emtricitabine-tenofovir AF (BIKTARVY) 50-200-25 MG TABS tablet Take 1 tablet by mouth daily. 01/18/23   Odette Fraction, MD  sulfamethoxazole-trimethoprim (BACTRIM) 400-80 MG tablet Take 1 tablet by mouth daily. 02/25/23   Odette Fraction, MD    Family History Family History  Problem Relation Age of Onset   Diabetes Mother     Social History Social History   Tobacco Use   Smoking status: Former    Current packs/day: 1.00    Types: Cigarettes   Smokeless tobacco: Never  Vaping Use   Vaping status: Never Used  Substance Use Topics   Alcohol use: Not Currently   Drug use: Not Currently    Types: Marijuana     Allergies   Amoxicillin   Review of Systems Review of Systems  Constitutional:  Negative for appetite change, chills and fever.  Gastrointestinal:  Positive for abdominal pain.  Genitourinary:  Positive for dysuria and frequency. Negative for difficulty urinating, flank pain, genital sores, penile discharge and testicular pain.  Musculoskeletal:  Positive for back pain.     Physical Exam Triage Vital Signs ED Triage Vitals  Encounter Vitals Group     BP 04/14/23 1103 127/73     Systolic BP Percentile --      Diastolic BP Percentile --      Pulse Rate 04/14/23 1103 61     Resp 04/14/23 1103 16     Temp 04/14/23 1103 98.2 F (36.8 C)  Temp Source 04/14/23 1103 Oral     SpO2 04/14/23 1103 96 %     Weight 04/14/23 1120 195 lb (88.5 kg)     Height 04/14/23 1120 6\' 1"  (1.854 m)     Head Circumference --      Peak Flow --      Pain Score 04/14/23 1119 6     Pain Loc --      Pain Education --      Exclude from Growth Chart --    No data found.  Updated Vital Signs BP 127/73 (BP Location: Right Arm)   Pulse 61   Temp 98.2 F (36.8 C) (Oral)   Resp 16   Ht 6\' 1"  (1.854 m)   Wt 195 lb (88.5 kg)   SpO2 96%   BMI 25.73 kg/m   Visual Acuity Right Eye Distance:   Left Eye Distance:   Bilateral Distance:    Right Eye Near:   Left  Eye Near:    Bilateral Near:     Physical Exam Vitals and nursing note reviewed.  Constitutional:      Appearance: He is not ill-appearing.  Cardiovascular:     Rate and Rhythm: Normal rate and regular rhythm.     Heart sounds: Normal heart sounds.  Pulmonary:     Effort: Pulmonary effort is normal.     Breath sounds: Normal breath sounds. No wheezing.  Abdominal:     General: Bowel sounds are normal.     Palpations: Abdomen is soft.     Tenderness: There is no abdominal tenderness. There is no right CVA tenderness, left CVA tenderness or guarding.  Skin:    General: Skin is warm and dry.  Neurological:     Mental Status: He is alert.      UC Treatments / Results  Labs (all labs ordered are listed, but only abnormal results are displayed) Labs Reviewed  POCT URINALYSIS DIP (MANUAL ENTRY)    EKG   Radiology No results found.  Procedures Procedures (including critical care time)  Medications Ordered in UC Medications - No data to display  Initial Impression / Assessment and Plan / UC Course  I have reviewed the triage vital signs and the nursing notes.  Pertinent labs & imaging results that were available during my care of the patient were reviewed by me and considered in my medical decision making (see chart for details).    38 year old male recent chlamydia infection did not complete his antibiotic course.  Presents today with suprapubic pain and dysuria all days.  Denies other symptoms.  Point-of-care urinalysis is normal.  Will retreat for chlamydia.  Warning signs and follow-up, follow-up for development of fever, vomiting or hematuria, localized right lower quadrant abdominal pain, new symptoms or concerns Final Clinical Impressions(s) / UC Diagnoses   Final diagnoses:  None   Discharge Instructions   None    ED Prescriptions   None    PDMP not reviewed this encounter.   Meliton Rattan, [place] 04/14/23 1142

## 2023-04-14 NOTE — ED Triage Notes (Signed)
Pt presents with complaints of lower abdominal pain, burning with urination, and lower back pain x 3 days. Pt currently rates pain a 6/10, pain increases with urination. Pt states approximately one month ago was diagnosed with STD, "gave me an injection while I was there, I picked up the prescription however did not complete it."

## 2023-04-19 ENCOUNTER — Ambulatory Visit
Admission: EM | Admit: 2023-04-19 | Discharge: 2023-04-19 | Disposition: A | Discharge status: Home / Self Care | Payer: Medicaid Other | Attending: Family Medicine | Admitting: Family Medicine

## 2023-04-19 ENCOUNTER — Other Ambulatory Visit: Payer: Self-pay

## 2023-04-19 ENCOUNTER — Encounter: Payer: Self-pay | Admitting: Emergency Medicine

## 2023-04-19 DIAGNOSIS — J09X2 Influenza due to identified novel influenza A virus with other respiratory manifestations: Secondary | ICD-10-CM

## 2023-04-19 DIAGNOSIS — R52 Pain, unspecified: Secondary | ICD-10-CM | POA: Diagnosis not present

## 2023-04-19 LAB — POC COVID19/FLU A&B COMBO
Covid Antigen, POC: NEGATIVE
Influenza A Antigen, POC: POSITIVE — AB
Influenza B Antigen, POC: NEGATIVE

## 2023-04-19 MED ORDER — OSELTAMIVIR PHOSPHATE 75 MG PO CAPS: 75.0000 mg | ORAL_CAPSULE | Freq: Two times a day (BID) | ORAL | 0 refills | Status: AC

## 2023-04-19 NOTE — Discharge Instructions (Signed)
 Supportive Care Medications  These are medications that may help with your symptoms. However, Please note that if your PCP has told you not to use certain products please follow his/her recommendations.  For example: - if you have high BP you should use something similar to Coricidin HPB, not Sudafed or antihistamines with a "D" such as Allegra D. The "D" means decongestant.  (Afrin is safe to use with HBP, but do not use longer than 3 days) -Higher Doses of NSAIDS (Ibuprofen, Aleve etc) with Kidney disease or poorly controlled BP.  Fever, Body aches, Headache  Ibuprofen 200 mg 2 tablets and Acetaminophen 2 tabs 4 times a day just before meals and at bedtime (every 6 hours)    Do not use NSAIDS if you are allergic to NSAIDS, if you have been given oral steroids-prednisone, methylprednisolone, dexamethasone until your steroids are finished, if you are pregnant or breast feeding, or if you have history of kidney or liver disease.   Sore Throat: Cepacol throat lozenges or spray  Cough Drops Warm salt water gargles  How to make saltwater rinses Use warm water, because warmth is more relieving to a sore throat than cold water. Warm water will also help the salt dissolve into the water more effectively. Use any type of salt you have available. Most saltwater rinse recipes call for 8 ounces of warm water and 1 teaspoon of salt. However, if your mouth is tender and the saltwater rinse stings, decrease the salt to a 1/2 teaspoon for the first 1 to 2 days. Bring water to a boil, then remove from heat, add salt, and stir. Let the saltwater cool to a warm temperature before rinsing with it. Once you have finished your rinse, discard leftover solution to avoid contamination.  Nasal Congestion: Afrin (Oxymetazoline) 1 squirt in each side of nose 2 times daily for 3 days only.  Oral decongestants like sudafed but only if you do not have high Blood Pressure if you have high Blood Pressure take CORICIDIN  HBP Antihistamines Nasal Saline/Neti Pot  Cough: Expectorants (guaifenesin) help thin mucus making it easier to get up. This should be used during the day. During the day coughing helps bring mucus up out of your lungs Suppressants (dextromethorphan) just for bedtime so you can sleep   Oral rehydration is important when you have been sweating more than usual and/or having nausea and vomiting. You can use over the counter rehydration supplements like Pedialyte sport, Gatorlyte, Electrolit, Liquid IV, or you can make your own at home. Recipe:  Mix 1 liter of clean or boiled water with 6 teaspoons (2 tablespoons) of sugar and 1/2 tsp of salt. Stir until both dissolve.  You can add sugar free flavoring (i.e. Crystal light) if desired.  Drink small sips frequently rather than large amounts at once to prevent nausea.

## 2023-04-19 NOTE — ED Triage Notes (Addendum)
Symptoms started Saturday.  Complains of head congestion, headache, eyes hurt, general body aches, sweating, and cough  Has had dayquil and nyquil  Patient is currently taking doxycycline

## 2023-04-19 NOTE — ED Provider Notes (Signed)
[facility]    CSN: [id number] Arrival date & time: 04/19/23  7829      History   Chief Complaint Chief Complaint  Patient presents with   Generalized Body Aches    HPI [facility] is a 38 y.o. adult.   HPI Not feeling well for 2 days symptoms include body aches, fatigue, headache, rhinorrhea, nasal, cough.  Admits sore throat and burning eyes.  Denies nausea, vomiting, diarrhea Denies known contacts with illness.  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      Home Medications    Prior to Admission medications   Medication Sig Start Date End Date Taking? Authorizing Provider  bictegravir-emtricitabine-tenofovir AF (BIKTARVY) 50-200-25 MG TABS tablet Take 1 tablet by mouth daily. 01/18/23   Odette Fraction, MD  doxycycline (VIBRAMYCIN) 100 MG capsule Take 1 capsule (100 mg total) by mouth 2 (two) times daily for 7 days. 04/14/23 04/21/23  Meliton Rattan, PA  sulfamethoxazole-trimethoprim (BACTRIM) 400-80 MG tablet Take 1 tablet by mouth daily. Patient not taking: Reported on 04/19/2023 02/25/23   Odette Fraction, MD     Family History Family History  Problem Relation Age of Onset   Diabetes Mother     Social History Social History   Tobacco Use   Smoking status: Former    Current packs/day: 1.00    Types: Cigarettes   Smokeless tobacco: Never  Vaping Use   Vaping status: Never Used  Substance Use Topics   Alcohol use: Never   Drug use: Never    Types: Marijuana     Allergies   Amoxicillin   Review of Systems Review of Systems  Constitutional:  Positive for fatigue. Negative for fever.  HENT:  Positive for congestion, rhinorrhea and sore throat. Negative for ear pain.   Respiratory:  Positive for cough. Negative for shortness of breath and wheezing.   Gastrointestinal:  Negative for diarrhea, nausea and vomiting.  Neurological:  Positive for headaches.     Physical Exam Triage Vital Signs ED Triage Vitals  Encounter Vitals Group     BP 04/19/23 0822 137/78     Systolic BP Percentile --      Diastolic BP Percentile --      Pulse Rate 04/19/23 0822 82     Resp 04/19/23 0822 18     Temp 04/19/23 0822 98.9 F (37.2 C)     Temp Source 04/19/23 0822 Oral     SpO2 04/19/23 0822 95 %     Weight --      Height --      Head Circumference --  Peak Flow --      Pain Score 04/19/23 0840 10     Pain Loc --      Pain Education --      Exclude from Growth Chart --    No data found.  Updated Vital Signs BP 137/78 (BP Location: Right Arm)   Pulse 82   Temp 98.9 F (37.2 C) (Oral)   Resp 18   SpO2 95%   Visual Acuity Right Eye Distance:   Left Eye Distance:   Bilateral Distance:    Right Eye Near:   Left Eye Near:    Bilateral Near:     Physical Exam Vitals and nursing note reviewed.  Constitutional:      Appearance: He is not ill-appearing.  HENT:     Head: Normocephalic and atraumatic.     Right Ear: Tympanic membrane and ear canal normal.     Left Ear: Tympanic membrane and ear canal normal.     Nose: Rhinorrhea present.     Mouth/Throat:     Mouth:  Mucous membranes are moist.     Pharynx: Posterior oropharyngeal erythema present.  Eyes:     General:        Right eye: No discharge.        Left eye: No discharge.     Conjunctiva/sclera: Conjunctivae normal.  Cardiovascular:     Rate and Rhythm: Normal rate and regular rhythm.     Heart sounds: Normal heart sounds.  Pulmonary:     Breath sounds: Normal breath sounds.  Musculoskeletal:     Cervical back: Neck supple.  Lymphadenopathy:     Cervical: No cervical adenopathy.  Skin:    General: Skin is warm and dry.  Neurological:     Mental Status: He is alert and oriented to person, place, and time.  Psychiatric:        Mood and Affect: Mood normal.      UC Treatments / Results  Labs (all labs ordered are listed, but only abnormal results are displayed) Labs Reviewed  POC COVID19/FLU A&B COMBO    EKG   Radiology No results found.  Procedures Procedures (including critical care time)  Medications Ordered in UC Medications - No data to display  Initial Impression / Assessment and Plan / UC Course  I have reviewed the triage vital signs and the nursing notes.  Pertinent labs & imaging results that were available during my care of the patient were reviewed by me and considered in my medical decision making (see chart for details).     38 year old male with flulike symptoms for 2 days, well-appearing, nontoxic, has lots of rhinorrhea on exam otherwise exam is normal.  Point-of-care COVID-negative, point-of-care flu positive for influenza A  Final Clinical Impressions(s) / UC Diagnoses   Final diagnoses:  Body aches   Discharge Instructions   None    ED Prescriptions   None    PDMP not reviewed this encounter.   Meliton Rattan, [place] 04/19/23 [phone]

## 2023-05-05 ENCOUNTER — Ambulatory Visit: Payer: Medicaid Other | Admitting: Infectious Diseases

## 2023-05-11 ENCOUNTER — Other Ambulatory Visit (HOSPITAL_COMMUNITY)
Admission: RE | Admit: 2023-05-11 | Discharge: 2023-05-11 | Disposition: A | Discharge status: Home / Self Care | Payer: Medicaid Other | Source: Ambulatory Visit | Attending: Physician Assistant | Admitting: Physician Assistant

## 2023-05-11 ENCOUNTER — Other Ambulatory Visit: Payer: Self-pay

## 2023-05-11 ENCOUNTER — Encounter: Payer: Self-pay | Admitting: Physician Assistant

## 2023-05-11 ENCOUNTER — Ambulatory Visit (INDEPENDENT_AMBULATORY_CARE_PROVIDER_SITE_OTHER): Payer: Medicaid Other | Admitting: Physician Assistant

## 2023-05-11 VITALS — BP 129/87 | HR 72 | Temp 97.7°F | Ht 73.0 in | Wt 198.0 lb

## 2023-05-11 DIAGNOSIS — Z113 Encounter for screening for infections with a predominantly sexual mode of transmission: Secondary | ICD-10-CM | POA: Insufficient documentation

## 2023-05-11 DIAGNOSIS — B20 Human immunodeficiency virus [HIV] disease: Secondary | ICD-10-CM

## 2023-05-11 MED ORDER — BICTEGRAVIR-EMTRICITAB-TENOFOV 50-200-25 MG PO TABS: 1.0000 | ORAL_TABLET | Freq: Every day | ORAL | 4 refills | Status: DC

## 2023-05-11 NOTE — Progress Notes (Signed)
 Subjective:    Patient ID: [facility], adult    DOB: [birth year], 38 y.o.   MRN: [id number]  Chief Complaint  Patient presents with   Follow-up    Requests STI testing     HPI:  [facility] is a 38 y.o. adult living with HIV. He is adherent to [facility] and tolerating well. He takes every morning without any missed doses.  He does not take any other OTC, supplements or vitamins. He is currently practicing celibacy. Treated for chlamydia 02/25/23. He has not been sexually active since that time, however, he wants to be tested for STIs today and start "fresh and clean." He completed course of doxycycline in December and has been asymptomatic since that time.  He declines all vaccinations today. Diagnosed with flu a few weeks ago, full recovery.   He is working currently at [employer]   Allergies  Allergen Reactions   Amoxicillin Anaphylaxis and Hives    Has patient had a PCN reaction causing immediate rash, facial/tongue/throat swelling, SOB or lightheadedness with hypotension: Yes Has patient had a PCN reaction causing severe rash involving mucus membranes or skin necrosis: Yes Has patient had a PCN reaction that required hospitalization: ED visit Has patient had a PCN reaction occurring within the last 10 years: Yes, in 2015 If all of the above answers are "NO", then may proceed with Cephalosporin use.       Outpatient Medications Prior to Visit  Medication Sig Dispense Refill   bictegravir-emtricitabine-tenofovir AF (BIKTARVY) 50-200-25 MG TABS tablet Take 1 tablet by mouth daily. 30 tablet 3   sulfamethoxazole-trimethoprim (BACTRIM) 400-80 MG tablet Take 1 tablet by mouth daily. (Patient not taking: Reported on 05/11/2023) 30 tablet 2   No facility-administered medications prior to visit.     Past Medical History:  Diagnosis Date   Hormone replacement therapy    Migraine    Transexualism      Past Surgical History:  Procedure  Laterality Date   COSMETIC SURGERY     rhinoplasty   RHINOPLASTY         Review of Systems  Constitutional:  Negative for chills and fever.  Respiratory:  Negative for cough and wheezing.   Cardiovascular:  Negative for chest pain and palpitations.  Genitourinary:  Negative for difficulty urinating, flank pain, frequency, genital sores, hematuria, penile discharge, penile pain, penile swelling, scrotal swelling and testicular pain.  Allergic/Immunologic: Positive for immunocompromised state.  Neurological:  Negative for weakness and headaches.  Hematological:  Negative for adenopathy.  Psychiatric/Behavioral: Negative.        Objective:    BP 129/87   Pulse 72   Temp 97.7 F (36.5 C) (Oral)   Ht 6\' 1"  (1.854 m)   Wt 198 lb (89.8 kg)   SpO2 98%   BMI 26.12 kg/m  Nursing note and vital signs reviewed.  Physical Exam Vitals reviewed.  Constitutional:      Appearance: Normal appearance. He is normal weight.  HENT:     Head: Normocephalic and atraumatic.     Mouth/Throat:     Mouth: Mucous membranes are moist.     Pharynx: Oropharynx is clear. No oropharyngeal exudate or posterior oropharyngeal erythema.  Cardiovascular:     Rate and Rhythm: Normal rate and regular rhythm.  Pulmonary:     Effort: Pulmonary effort is normal.     Breath sounds: Normal breath sounds.  Musculoskeletal:        General: Normal range of motion.  Cervical back: Normal range of motion.  Skin:    General: Skin is warm and dry.     Findings: No lesion or rash.  Neurological:     General: No focal deficit present.     Mental Status: He is alert and oriented to person, place, and time.  Psychiatric:        Mood and Affect: Mood normal.        Behavior: Behavior normal.        Thought Content: Thought content normal.        Judgment: Judgment normal.         05/11/2023    1:32 PM 02/25/2023    1:53 PM 12/29/2022    8:42 AM 11/03/2022    8:38 AM 09/24/2022   10:21 AM  Depression  screen PHQ 2/9  Decreased Interest 0 0 0 0 0  Down, Depressed, Hopeless 0 0 0 0 0  PHQ - 2 Score 0 0 0 0 0       Assessment & Plan:  HIV-1: refilled biktarvy, reviewed encounter and lab work from 02/25/2023, adherent and tolerant of Biktarvy STI screening-Not sexually active since last visit + chlamydia, completed course of doxy, asymptomatic, declined condoms "practicing celibacy" for a while. 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   Substance abuse (HCC) 10/10/2013   Borderline personality disorder (HCC) 10/10/2013     Problem List Items Addressed This Visit     HIV disease (HCC) - Primary   Relevant Medications   bictegravir-emtricitabine-tenofovir AF (BIKTARVY) 50-200-25 MG TABS tablet   Other Relevant Orders   Cytology (oral, anal, urethral) ancillary only   Cytology (oral, anal, urethral) ancillary only   RPR   Urine cytology ancillary only   COMPLETE METABOLIC PANEL WITH GFR   CBC with Differential/Platelet   HIV-1 RNA quant-no reflex-bld   T-helper cell (CD4)- ([facility] clinic only)   Screening for STDs (sexually transmitted diseases)   Relevant Orders   Cytology (oral, anal, urethral) ancillary only   Cytology (oral, anal, urethral) ancillary only   RPR   Urine cytology ancillary only     I have discontinued Casimiro Needle A. Montavon's sulfamethoxazole-trimethoprim. I am also having him maintain his bictegravir-emtricitabine-tenofovir AF.   Meds ordered this encounter   Medications   bictegravir-emtricitabine-tenofovir AF (BIKTARVY) 50-200-25 MG TABS tablet    Sig: Take 1 tablet by mouth daily.    Dispense:  30 tablet    Refill:  4    Supervising Provider:   Daiva Eves, CORNELIUS N [[id number]]    Prescription Type::   Renewal     Follow-up: Return in about 4 months (around 09/08/2023) for B20.

## 2023-05-11 NOTE — Patient Instructions (Signed)
 Refilled Biktarvy today Labs today Will send mychart with all results Follow up in 4 months with Dr. Karene Fry

## 2023-05-12 LAB — CYTOLOGY, (ORAL, ANAL, URETHRAL) ANCILLARY ONLY
Chlamydia: NEGATIVE
Chlamydia: NEGATIVE
Comment: NEGATIVE
Comment: NEGATIVE
Comment: NORMAL
Comment: NORMAL
Neisseria Gonorrhea: NEGATIVE
Neisseria Gonorrhea: NEGATIVE

## 2023-05-12 LAB — URINE CYTOLOGY ANCILLARY ONLY
Chlamydia: NEGATIVE
Comment: NEGATIVE
Comment: NORMAL
Neisseria Gonorrhea: NEGATIVE

## 2023-05-12 LAB — T-HELPER CELL (CD4) - (RCID CLINIC ONLY)
CD4 % Helper T Cell: 35 % (ref 33–65)
CD4 T Cell Abs: 290 /uL — ABNORMAL LOW (ref 400–1790)

## 2023-05-13 LAB — CBC WITH DIFFERENTIAL/PLATELET
Absolute Lymphocytes: 861 {cells}/uL (ref 850–3900)
Absolute Monocytes: 252 {cells}/uL (ref 200–950)
Basophils Absolute: 11 {cells}/uL (ref 0–200)
Basophils Relative: 0.3 %
Eosinophils Absolute: 32 {cells}/uL (ref 15–500)
Eosinophils Relative: 0.9 %
HCT: 42.3 % (ref 38.5–50.0)
Hemoglobin: 14 g/dL (ref 13.2–17.1)
MCH: 30.6 pg (ref 27.0–33.0)
MCHC: 33.1 g/dL (ref 32.0–36.0)
MCV: 92.4 fL (ref 80.0–100.0)
MPV: 10.9 fL (ref 7.5–12.5)
Monocytes Relative: 7.2 %
Neutro Abs: 2345 {cells}/uL (ref 1500–7800)
Neutrophils Relative %: 67 %
Platelets: 160 10*3/uL (ref 140–400)
RBC: 4.58 10*6/uL (ref 4.20–5.80)
RDW: 14.1 % (ref 11.0–15.0)
Total Lymphocyte: 24.6 %
WBC: 3.5 10*3/uL — ABNORMAL LOW (ref 3.8–10.8)

## 2023-05-13 LAB — HIV-1 RNA QUANT-NO REFLEX-BLD
HIV 1 RNA Quant: <20 {copies}/mL — ABNORMAL HIGH
HIV-1 RNA Quant, Log: <1.3 {Log} — ABNORMAL HIGH

## 2023-05-13 LAB — COMPLETE METABOLIC PANEL WITH GFR
AG Ratio: 1.5 (calc) (ref 1.0–2.5)
ALT: 11 U/L (ref 9–46)
AST: 15 U/L (ref 10–40)
Albumin: 4.3 g/dL (ref 3.6–5.1)
Alkaline phosphatase (APISO): 28 U/L — ABNORMAL LOW (ref 36–130)
BUN/Creatinine Ratio: 5 (calc) — ABNORMAL LOW (ref 6–22)
BUN: 8 mg/dL (ref 7–25)
CO2: 30 mmol/L (ref 20–32)
Calcium: 9.2 mg/dL (ref 8.6–10.3)
Chloride: 102 mmol/L (ref 98–110)
Creat: 1.53 mg/dL — ABNORMAL HIGH (ref 0.60–1.26)
Globulin: 2.9 g/dL (ref 1.9–3.7)
Glucose, Bld: 69 mg/dL (ref 65–99)
Potassium: 4 mmol/L (ref 3.5–5.3)
Sodium: 139 mmol/L (ref 135–146)
Total Bilirubin: 1.1 mg/dL (ref 0.2–1.2)
Total Protein: 7.2 g/dL (ref 6.1–8.1)
eGFR: 59 mL/min/{1.73_m2} — ABNORMAL LOW (ref >=60)

## 2023-05-13 LAB — RPR: RPR Ser Ql: NONREACTIVE

## 2023-06-03 ENCOUNTER — Ambulatory Visit: Admission: EM | Admit: 2023-06-03 | Discharge: 2023-06-03 | Disposition: A | Discharge status: Home / Self Care

## 2023-06-03 ENCOUNTER — Encounter: Payer: Self-pay | Admitting: Emergency Medicine

## 2023-06-03 DIAGNOSIS — B349 Viral infection, unspecified: Secondary | ICD-10-CM | POA: Diagnosis not present

## 2023-06-03 LAB — POCT RAPID STREP A (OFFICE): Rapid Strep A Screen: NEGATIVE

## 2023-06-03 LAB — POC COVID19/FLU A&B COMBO
Covid Antigen, POC: NEGATIVE
Influenza A Antigen, POC: NEGATIVE
Influenza B Antigen, POC: NEGATIVE

## 2023-06-03 MED ORDER — IBUPROFEN 800 MG PO TABS
800.0000 mg | ORAL_TABLET | Freq: Three times a day (TID) | ORAL | 0 refills | Status: AC
Start: 2023-06-03 — End: ?

## 2023-06-03 MED ORDER — BENZONATATE 200 MG PO CAPS: 200.0000 mg | ORAL_CAPSULE | Freq: Three times a day (TID) | ORAL | 0 refills | Status: DC | PRN

## 2023-06-03 MED ORDER — AZELASTINE HCL 137 MCG/SPRAY NA SOLN: 1.0000 | Freq: Two times a day (BID) | NASAL | 1 refills | Status: DC | PRN

## 2023-06-03 NOTE — ED Provider Notes (Signed)
 [facility]  Note:  This document was prepared using Dragon voice recognition software and may include unintentional dictation errors.  MRN: [id number] DOB: December 22, 1985  Subjective:   [facility] is a 38 y.o. adult presenting for sore throat, cough, nasal congestion, headache, body aches x 2 to 3 days.  Patient denies any known sick exposures.  No chest pain, shortness of breath, weakness, dizziness.  States that cough is usually worse at night and sore throat and mucus production is worse first thing in the morning.  No current facility-administered medications for this encounter.  Current Outpatient Medications:    Azelastine HCl 137 MCG/SPRAY SOLN, Place 1 spray into the nose 2 (two) times daily as needed., Disp: 30 mL, Rfl: 1   benzonatate (TESSALON) 200 MG capsule, Take 1 capsule (200 mg total) by mouth 3 (three) times daily as needed for cough., Disp: 20 capsule, Rfl: 0   ibuprofen (ADVIL) 800 MG tablet, Take 1 tablet (800 mg total) by mouth 3 (three) times daily., Disp: 21 tablet, Rfl: 0   bictegravir-emtricitabine-tenofovir AF (BIKTARVY) 50-200-25 MG TABS tablet, Take 1 tablet by mouth daily., Disp: 30 tablet, Rfl: 4   Allergies  Allergen Reactions   Amoxicillin Anaphylaxis and Hives    Has patient had a PCN reaction causing immediate rash, facial/tongue/throat swelling, SOB or lightheadedness with hypotension: Yes Has patient had a PCN reaction causing severe rash involving mucus membranes or skin necrosis: Yes Has patient had a PCN reaction that required hospitalization: ED visit Has patient had a PCN reaction occurring within the last 10 years: Yes, in 2015 If all of the above answers are "NO", then may proceed with Cephalosporin use.     Past Medical History:  Diagnosis Date   Hormone replacement therapy    Migraine    Transexualism      Past Surgical History:  Procedure Laterality Date   COSMETIC SURGERY     rhinoplasty   RHINOPLASTY       Family History  Problem Relation Age of Onset   Diabetes Mother     Social History   Tobacco Use   Smoking status: Former    Current packs/day: 1.00    Types: Cigarettes   Smokeless tobacco: Never  Vaping Use   Vaping status: Never Used  Substance Use Topics   Alcohol use: Never   Drug use: Never    Types: Marijuana    ROS Refer to HPI for ROS details.  Objective:   Vitals: BP 125/73 (BP Location: Right Arm)   Pulse 73   Temp 98 F (36.7 C) (Oral)   Resp 20   SpO2 96%   Physical Exam Vitals and nursing note reviewed.  Constitutional:      General: He is not in acute distress.    Appearance: Normal appearance. He is well-developed and normal weight. He is not ill-appearing or toxic-appearing.  HENT:     Head: Normocephalic and atraumatic.     Right Ear: Tympanic membrane, ear canal and external ear normal.     Left Ear: Tympanic membrane, ear canal and external ear normal.     Nose: Congestion and rhinorrhea present.     Mouth/Throat:     Mouth: Mucous membranes are moist.     Pharynx: Oropharynx is clear. Posterior oropharyngeal erythema present. No oropharyngeal exudate.  Eyes:     Extraocular Movements: Extraocular movements intact.     Conjunctiva/sclera: Conjunctivae normal.  Cardiovascular:     Rate and Rhythm: Normal rate and  regular rhythm.     Heart sounds: No murmur heard. Pulmonary:     Effort: Pulmonary effort is normal. No respiratory distress.     Breath sounds: Normal breath sounds. No stridor. No wheezing, rhonchi or rales.  Skin:    General: Skin is warm and dry.  Neurological:     General: No focal deficit present.     Mental Status: He is alert and oriented to person, place, and time.  Psychiatric:        Mood and Affect: Mood normal.        Behavior: Behavior normal.     Procedures  Results for orders placed or performed during the hospital encounter of 06/03/23 (from the past 24 hours)  POC Covid19/Flu A&B Antigen      Status: None   Collection Time: 06/03/23  8:57 AM  Result Value Ref Range   Influenza A Antigen, POC Negative Negative   Influenza B Antigen, POC Negative Negative   Covid Antigen, POC Negative Negative  POCT rapid strep A     Status: None   Collection Time: 06/03/23  8:57 AM  Result Value Ref Range   Rapid Strep A Screen Negative Negative    Assessment and Plan :   PDMP not reviewed this encounter.  1. Acute viral syndrome    1. Acute viral syndrome (Primary) - POC Covid19/Flu A&B Antigen is negative - POCT rapid strep A is negative - Azelastine HCl 137 MCG/SPRAY SOLN; Place 1 spray into the nose 2 (two) times daily as needed for postnasal drip and congestion - benzonatate (TESSALON) 200 MG capsule; Take 1 capsule (200 mg total) by mouth 3 (three) times daily as needed for cough suppression, most effective when taken at night before bed. - ibuprofen (ADVIL) 800 MG tablet; Take 1 tablet (800 mg total) by mouth 3 (three) times daily for headache and muscle aches -Continue to monitor symptoms for a change in severity if there is any escalation of current symptoms follow-up for further evaluation and management  Mirna Mires B, NP 06/03/23 [phone]

## 2023-06-03 NOTE — ED Triage Notes (Signed)
 Pt c/o sinus pressure, runny nose, sneezing, ear fullness, and scratchy throat for 4 days.

## 2023-06-03 NOTE — Discharge Instructions (Addendum)
 1. Acute viral syndrome (Primary) - POC Covid19/Flu A&B Antigen is negative - POCT rapid strep A is negative - Azelastine HCl 137 MCG/SPRAY SOLN; Place 1 spray into the nose 2 (two) times daily as needed for postnasal drip and congestion - benzonatate (TESSALON) 200 MG capsule; Take 1 capsule (200 mg total) by mouth 3 (three) times daily as needed for cough suppression, most effective when taken at night before bed. - ibuprofen (ADVIL) 800 MG tablet; Take 1 tablet (800 mg total) by mouth 3 (three) times daily for headache and muscle aches -Continue to monitor symptoms for a change in severity if there is any escalation of current symptoms follow-up for further evaluation and management

## 2023-07-06 ENCOUNTER — Other Ambulatory Visit: Payer: Self-pay

## 2023-07-06 ENCOUNTER — Ambulatory Visit (INDEPENDENT_AMBULATORY_CARE_PROVIDER_SITE_OTHER): Admitting: Pharmacist

## 2023-07-06 DIAGNOSIS — Z113 Encounter for screening for infections with a predominantly sexual mode of transmission: Secondary | ICD-10-CM

## 2023-07-06 NOTE — Progress Notes (Signed)
   HPI: [facility] is a 38 y.o. adult who presents to the [facility] today for STI testing.  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   BENZONATATE  (TESSALON ) 200 MG CAPSULE    Take 1 capsule (200 mg total) by mouth 3 (three) times daily as needed for cough.   BICTEGRAVIR-EMTRICITABINE-TENOFOVIR AF (BIKTARVY ) 50-200-25 MG TABS TABLET    Take 1 tablet by mouth daily.   IBUPROFEN  (ADVIL ) 800 MG TABLET    Take 1 tablet (800 mg total) by mouth 3 (three) times daily.  Modified Medications   No medications on file  Discontinued Medications   No medications on file    Assessment: [facility] is a 38 yo male presenting for routine STI screening. He was treated for chlamydia in 02/2023. Reports pain in the back and lower abdominal area and a sense of heat when he pees. States that these symptoms are similar to when he had chlamydia in the past.  Last screening was back in February and was negative, reports he has had potential exposures since then. Will do RPR, urine/rectal/oral cytologies today for screening and will follow-up to see if treatment is needed.   Discussed the option of starting DoxyPEP to assist with prevention  of STIs. Discussed how to take the medication and the rare but potential side effect of causing antibiotic resistance. Patient declined at this time.  Plan: - STI screening: RPR, urine/rectal/pharyngeal GC/CT swabs for cytology today - F/u results to see if treatment is needed  Jalila Goodnough, PharmD PGY1 Acute Care Pharmacy Resident  07/06/2023 1:29 PM

## 2023-07-07 LAB — RPR: RPR Ser Ql: NONREACTIVE

## 2023-07-07 LAB — C. TRACHOMATIS/N. GONORRHOEAE RNA
C. trachomatis RNA, TMA: NOT DETECTED
N. gonorrhoeae RNA, TMA: NOT DETECTED

## 2023-07-07 LAB — GC/CHLAMYDIA PROBE, AMP (THROAT)
Chlamydia trachomatis RNA: NOT DETECTED
Neisseria gonorrhoeae RNA: NOT DETECTED

## 2023-07-07 LAB — CT/NG RNA, TMA RECTAL
Chlamydia Trachomatis RNA: NOT DETECTED
Neisseria Gonorrhoeae RNA: NOT DETECTED

## 2023-07-28 ENCOUNTER — Encounter: Payer: Self-pay | Admitting: Emergency Medicine

## 2023-07-28 ENCOUNTER — Ambulatory Visit
Admission: EM | Admit: 2023-07-28 | Discharge: 2023-07-28 | Disposition: A | Discharge status: Home / Self Care | Attending: Emergency Medicine | Admitting: Emergency Medicine

## 2023-07-28 DIAGNOSIS — R1030 Lower abdominal pain, unspecified: Secondary | ICD-10-CM | POA: Diagnosis not present

## 2023-07-28 LAB — POCT URINALYSIS DIP (MANUAL ENTRY)
Bilirubin, UA: NEGATIVE
Blood, UA: NEGATIVE
Glucose, UA: NEGATIVE mg/dL
Ketones, POC UA: NEGATIVE mg/dL
Leukocytes, UA: NEGATIVE
Nitrite, UA: NEGATIVE
Protein Ur, POC: NEGATIVE mg/dL
Spec Grav, UA: 1.02 (ref 1.010–1.025)
Urobilinogen, UA: 0.2 U/dL
pH, UA: 6.5 (ref 5.0–8.0)

## 2023-07-28 NOTE — ED Provider Notes (Signed)
 [name]    CSN: [id number] Arrival date & time: 07/28/23  1654      History   Chief Complaint Chief Complaint  Patient presents with   Abdominal Pain    HPI Frederick Hess is a 38 y.o. adult.   Patient presents to clinic over concern of intermittent lower abdominal pain that has been ongoing for the past 2 days.  Does report that his urine feels more hot than usual.  Has not had any dysuria.  Denies penile discharge.  No recent unprotected insertive intercourse.  Denies nausea, vomiting or diarrhea.  Did have a normal bowel movement yesterday.  No blood in the stool.  Has not tried any medications or taken anything for interventions.  Does have HIV and reports compliance with Biktarvy .  The history is provided by the patient and medical records.  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      Home Medications    Prior to Admission medications   Medication Sig Start Date End Date Taking? Authorizing Provider  Azelastine  HCl 137 MCG/SPRAY SOLN Place 1 spray into the nose 2 (two) times daily as  needed. 06/03/23   Reddick, Johnathan B, NP  benzonatate  (TESSALON ) 200 MG capsule Take 1 capsule (200 mg total) by mouth 3 (three) times daily as needed for cough. Patient not taking: Reported on 07/28/2023 06/03/23   Reddick, Johnathan B, NP  bictegravir-emtricitabine-tenofovir AF (BIKTARVY ) 50-200-25 MG TABS tablet Take 1 tablet by mouth daily. 05/11/23   Jamesetta Mcbride, PA-C  ibuprofen  (ADVIL ) 800 MG tablet Take 1 tablet (800 mg total) by mouth 3 (three) times daily. 06/03/23   Alease Hunter, NP    Family History Family History  Problem Relation Age of Onset   Diabetes Mother     Social History Social History   Tobacco Use   Smoking status: Former    Current packs/day: 1.00    Types: Cigarettes   Smokeless tobacco: Never  Vaping Use   Vaping status: Never Used  Substance Use Topics   Alcohol use: Never   Drug use: Never    Types: Marijuana     Allergies   Amoxicillin    Review of Systems Review of Systems  Per HPI  Physical Exam Triage Vital Signs ED Triage Vitals  Encounter Vitals Group     BP 07/28/23 1712 (!) 150/77     Systolic BP Percentile --      Diastolic BP Percentile --      Pulse Rate 07/28/23 1712 99     Resp 07/28/23 1712 17  Temp 07/28/23 1712 98.1 F (36.7 C)     Temp Source 07/28/23 1712 Oral     SpO2 07/28/23 1712 96 %     Weight --      Height --      Head Circumference --      Peak Flow --      Pain Score 07/28/23 1715 8     Pain Loc --      Pain Education --      Exclude from Growth Chart --    No data found.  Updated Vital Signs BP (!) 150/77 (BP Location: Right Arm)   Pulse 99   Temp 98.1 F (36.7 C) (Oral)   Resp 17   SpO2 96%   Visual Acuity Right Eye Distance:   Left Eye Distance:   Bilateral Distance:    Right Eye Near:   Left Eye Near:    Bilateral Near:     Physical Exam Vitals and nursing note reviewed.  Constitutional:      Appearance: Normal appearance.  HENT:     Head: Normocephalic and  atraumatic.     Right Ear: External ear normal.     Left Ear: External ear normal.     Nose: Nose normal.     Mouth/Throat:     Mouth: Mucous membranes are moist.  Eyes:     Conjunctiva/sclera: Conjunctivae normal.  Pulmonary:     Effort: Pulmonary effort is normal. No respiratory distress.  Abdominal:     General: Abdomen is flat. Bowel sounds are normal.     Palpations: Abdomen is soft.     Tenderness: There is abdominal tenderness. There is no guarding.  Musculoskeletal:        General: Normal range of motion.  Skin:    General: Skin is warm and dry.  Neurological:     General: No focal deficit present.     Mental Status: He is alert.  Psychiatric:        Mood and Affect: Mood normal.      UC Treatments / Results  Labs (all labs ordered are listed, but only abnormal results are displayed) Labs Reviewed  POCT URINALYSIS DIP (MANUAL ENTRY)    EKG   Radiology No results found.  Procedures Procedures (including critical care time)  Medications Ordered in UC Medications - No data to display  Initial Impression / Assessment and Plan / UC Course  I have reviewed the triage vital signs and the nursing notes.  Pertinent labs & imaging results that were available during my care of the patient were reviewed by me and considered in my medical decision making (see chart for details).  Vitals in triage reviewed, patient is hemodynamically stable.  Intermittent lower abdominal pain.  Urine feels more hot than usual.  UA unremarkable.  Denies penile discharge or risk for STIs.  STI testing deferred.  Abdomen is soft with active bowel sounds, mild lower quadrant tenderness.  Without rebound or guarding.  Low concern for surgical abdomen at this time.  Symptomatic management for abdominal pain discussed.  Of care, follow-up care return precautions given, no questions at this time.     Final Clinical Impressions(s) / UC Diagnoses   Final diagnoses:  Lower abdominal pain      Discharge Instructions      Your urine sample does not show evidence of infection.  Ensure you are drinking at least 64 ounces of water  daily to help flush your kidneys.  For any pain you can  take 800 mg of ibuprofen  as needed.  You can create a diary to see what triggers your abdominal pain.  If this persist please follow-up with your primary care provider.  For any severe abdominal pain, vomiting, or new concerning symptoms please seek immediate care at the nearest emergency department where they can consider advanced imaging.    ED Prescriptions   None    PDMP not reviewed this encounter.   Starlin Echevaria, FNP 07/28/23 1750

## 2023-07-28 NOTE — ED Triage Notes (Signed)
 Pt c/o lower abdominal pain for 2 days. Denies urinary symptoms. States pain comes and goes and is sharp

## 2023-07-28 NOTE — Discharge Instructions (Addendum)
 Your urine sample does not show evidence of infection.  Ensure you are drinking at least 64 ounces of water  daily to help flush your kidneys.  For any pain you can take 800 mg of ibuprofen  as needed.  You can create a diary to see what triggers your abdominal pain.  If this persist please follow-up with your primary care provider.  For any severe abdominal pain, vomiting, or new concerning symptoms please seek immediate care at the nearest emergency department where they can consider advanced imaging.

## 2023-08-07 ENCOUNTER — Emergency Department (HOSPITAL_COMMUNITY)

## 2023-08-07 ENCOUNTER — Other Ambulatory Visit: Payer: Self-pay

## 2023-08-07 ENCOUNTER — Emergency Department (HOSPITAL_COMMUNITY)
Admission: EM | Admit: 2023-08-07 | Discharge: 2023-08-07 | Disposition: A | Discharge status: Home / Self Care | Attending: Emergency Medicine | Admitting: Emergency Medicine

## 2023-08-07 DIAGNOSIS — Z21 Asymptomatic human immunodeficiency virus [HIV] infection status: Secondary | ICD-10-CM | POA: Diagnosis not present

## 2023-08-07 DIAGNOSIS — N390 Urinary tract infection, site not specified: Secondary | ICD-10-CM | POA: Insufficient documentation

## 2023-08-07 DIAGNOSIS — R300 Dysuria: Secondary | ICD-10-CM | POA: Diagnosis present

## 2023-08-07 LAB — I-STAT CHEM 8, ED
BUN: 17 mg/dL (ref 6–20)
Calcium, Ion: 1.23 mmol/L (ref 1.15–1.40)
Chloride: 107 mmol/L (ref 98–111)
Creatinine, Ser: 1.5 mg/dL — ABNORMAL HIGH (ref 0.61–1.24)
Glucose, Bld: 128 mg/dL — ABNORMAL HIGH (ref 70–99)
HCT: 41 % (ref 39.0–52.0)
Hemoglobin: 13.9 g/dL (ref 13.0–17.0)
Potassium: 3.7 mmol/L (ref 3.5–5.1)
Sodium: 142 mmol/L (ref 135–145)
TCO2: 26 mmol/L (ref 22–32)

## 2023-08-07 LAB — URINALYSIS, W/ REFLEX TO CULTURE (INFECTION SUSPECTED)
Bilirubin Urine: NEGATIVE
Glucose, UA: NEGATIVE mg/dL
Ketones, ur: NEGATIVE mg/dL
Nitrite: POSITIVE — AB
Protein, ur: NEGATIVE mg/dL
Specific Gravity, Urine: 1.011 (ref 1.005–1.030)
WBC, UA: >50 WBC/hpf (ref 0–5)
pH: 5 (ref 5.0–8.0)

## 2023-08-07 LAB — COMPREHENSIVE METABOLIC PANEL WITH GFR
ALT: 18 U/L (ref 0–44)
AST: 24 U/L (ref 15–41)
Albumin: 3.8 g/dL (ref 3.5–5.0)
Alkaline Phosphatase: 32 U/L — ABNORMAL LOW (ref 38–126)
Anion gap: 6 (ref 5–15)
BUN: 18 mg/dL (ref 6–20)
CO2: 24 mmol/L (ref 22–32)
Calcium: 9 mg/dL (ref 8.9–10.3)
Chloride: 108 mmol/L (ref 98–111)
Creatinine, Ser: 1.43 mg/dL — ABNORMAL HIGH (ref 0.61–1.24)
GFR, Estimated: >60 mL/min (ref >60)
Glucose, Bld: 127 mg/dL — ABNORMAL HIGH (ref 70–99)
Potassium: 3.5 mmol/L (ref 3.5–5.1)
Sodium: 138 mmol/L (ref 135–145)
Total Bilirubin: 1.3 mg/dL — ABNORMAL HIGH (ref 0.0–1.2)
Total Protein: 7.4 g/dL (ref 6.5–8.1)

## 2023-08-07 LAB — CBC WITH DIFFERENTIAL/PLATELET
Abs Immature Granulocytes: 0.01 10*3/uL (ref 0.00–0.07)
Basophils Absolute: 0 10*3/uL (ref 0.0–0.1)
Basophils Relative: 0 %
Eosinophils Absolute: 0 10*3/uL (ref 0.0–0.5)
Eosinophils Relative: 0 %
HCT: 40.6 % (ref 39.0–52.0)
Hemoglobin: 13.9 g/dL (ref 13.0–17.0)
Immature Granulocytes: 0 %
Lymphocytes Relative: 11 %
Lymphs Abs: 0.6 10*3/uL — ABNORMAL LOW (ref 0.7–4.0)
MCH: 30.3 pg (ref 26.0–34.0)
MCHC: 34.2 g/dL (ref 30.0–36.0)
MCV: 88.5 fL (ref 80.0–100.0)
Monocytes Absolute: 0.2 10*3/uL (ref 0.1–1.0)
Monocytes Relative: 4 %
Neutro Abs: 4.8 10*3/uL (ref 1.7–7.7)
Neutrophils Relative %: 85 %
Platelets: 107 10*3/uL — ABNORMAL LOW (ref 150–400)
RBC: 4.59 MIL/uL (ref 4.22–5.81)
RDW: 13.7 % (ref 11.5–15.5)
WBC: 5.6 10*3/uL (ref 4.0–10.5)
nRBC: 0 % (ref 0.0–0.2)

## 2023-08-07 LAB — LIPASE, BLOOD: Lipase: 29 U/L (ref 11–51)

## 2023-08-07 LAB — I-STAT CG4 LACTIC ACID, ED: Lactic Acid, Venous: 1.6 mmol/L (ref 0.5–1.9)

## 2023-08-07 LAB — PROTIME-INR
INR: 1 (ref 0.8–1.2)
Prothrombin Time: 13.8 s (ref 11.4–15.2)

## 2023-08-07 MED ORDER — ONDANSETRON HCL 4 MG/2ML IJ SOLN
4.0000 mg | Freq: Once | INTRAMUSCULAR | Status: AC
  Administered 2023-08-07: 4 mg via INTRAVENOUS
  Filled 2023-08-07: qty 2

## 2023-08-07 MED ORDER — ACETAMINOPHEN 500 MG PO TABS
1000.0000 mg | ORAL_TABLET | Freq: Once | ORAL | Status: DC
  Filled 2023-08-07: qty 2

## 2023-08-07 MED ORDER — IOHEXOL 300 MG/ML  SOLN
100.0000 mL | Freq: Once | INTRAMUSCULAR | Status: AC | PRN
  Administered 2023-08-07: 100 mL via INTRAVENOUS

## 2023-08-07 MED ORDER — SODIUM CHLORIDE 0.9 % IV BOLUS
1000.0000 mL | Freq: Once | INTRAVENOUS | Status: AC
  Administered 2023-08-07: 1000 mL via INTRAVENOUS

## 2023-08-07 MED ORDER — CIPROFLOXACIN IN D5W 400 MG/200ML IV SOLN
400.0000 mg | Freq: Once | INTRAVENOUS | Status: AC
  Administered 2023-08-07: 400 mg via INTRAVENOUS
  Filled 2023-08-07: qty 200

## 2023-08-07 MED ORDER — CIPROFLOXACIN HCL 500 MG PO TABS: 500.0000 mg | ORAL_TABLET | Freq: Two times a day (BID) | ORAL | 0 refills | Status: AC

## 2023-08-07 MED ORDER — MORPHINE SULFATE (PF) 4 MG/ML IV SOLN
4.0000 mg | Freq: Once | INTRAVENOUS | Status: AC
  Administered 2023-08-07: 4 mg via INTRAVENOUS
  Filled 2023-08-07: qty 1

## 2023-08-07 MED ORDER — ONDANSETRON 4 MG PO TBDP: 4.0000 mg | ORAL_TABLET | Freq: Three times a day (TID) | ORAL | 0 refills | Status: DC | PRN

## 2023-08-07 NOTE — Discharge Instructions (Addendum)
 You were seen today for UTI, I have started you on an antibiotic. Take Tylenol  as needed for headache, body aches or fever. Please stay well hydrated with water , alternating electrolyte drink. Return with new or worsening symptoms.

## 2023-08-07 NOTE — ED Notes (Signed)
 Talked to lab for the [facility] Chlamydia probe amp. Stated they will send it out to [facility] to be done.

## 2023-08-07 NOTE — ED Triage Notes (Signed)
 Pt BIB [facility] from home. Pt reports waking up 5 hours ago with NV and bilateral flank pain. Pt temp 102.7 with ems. Pt c/o difficulty urinating for 3 days.  126HR 140 palpated 20RR 96% RA

## 2023-08-07 NOTE — ED Provider Notes (Signed)
 [place] EMERGENCY DEPARTMENT AT [facility] Provider Note   CSN: [id number] Arrival date & time: 08/07/23  4098     History  Chief Complaint  Patient presents with   Nausea    Frederick Hess is a 38 y.o. adult.  Patient with past medical history of HIV on Biktarvy  presenting to emergency room with complaining of lower abdominal pain, dysuria, nausea and vomiting.  He reports he had symptoms like this in the past but has been worse over the past few days.  Has not noted any discharge or blood in urine.  Had a fever with EMS and was not given Tylenol , no longer has fever. Denies CP, SOB, cough.   HPI     Home Medications Prior to Admission medications   Medication Sig Start Date End Date Taking? Authorizing Provider  Azelastine  HCl 137 MCG/SPRAY SOLN Place 1 spray into the nose 2 (two) times daily as needed. 06/03/23   Reddick, Johnathan B, NP  benzonatate  (TESSALON ) 200 MG capsule Take 1 capsule (200 mg total) by mouth 3 (three) times daily as needed for cough. Patient not taking: Reported on 07/28/2023 06/03/23   Reddick, Johnathan B, NP  bictegravir-emtricitabine-tenofovir AF (BIKTARVY ) 50-200-25 MG TABS tablet Take 1 tablet by mouth daily. 05/11/23   Jamesetta Mcbride, PA-C  ibuprofen  (ADVIL ) 800 MG tablet Take 1 tablet (800 mg total) by mouth 3 (three) times daily. 06/03/23   Reddick, Johnathan B, NP      Allergies    Amoxicillin     Review of Systems   Review of Systems  Gastrointestinal:  Positive for abdominal pain.    Physical Exam Updated Vital Signs BP (!) 119/44   Pulse (!) 109   Temp 99.5 F (37.5 C) (Oral)   Resp (!) 32   SpO2 94%  Physical Exam Vitals and nursing note reviewed.  Constitutional:      General: He is not in acute distress.    Appearance: He is not toxic-appearing.  HENT:     Head: Normocephalic and atraumatic.  Eyes:     General: No scleral icterus.    Conjunctiva/sclera: Conjunctivae normal.  Cardiovascular:     Rate  and Rhythm: Normal rate and regular rhythm.     Pulses: Normal pulses.     Heart sounds: Normal heart sounds.  Pulmonary:     Effort: Pulmonary effort is normal. No respiratory distress.     Breath sounds: Normal breath sounds.  Abdominal:     General: Abdomen is flat. Bowel sounds are normal.     Palpations: Abdomen is soft.     Tenderness: There is abdominal tenderness. There is no right CVA tenderness or left CVA tenderness.     Comments: RLQ and LLQ TTP  Musculoskeletal:     Right lower leg: No edema.     Left lower leg: No edema.     Comments: Lumbar spine without tenderness.  No step-off forming.   Skin:    General: Skin is warm and dry.     Findings: No lesion.  Neurological:     General: No focal deficit present.     Mental Status: He is alert and oriented to person, place, and time. Mental status is at baseline.     ED Results / Procedures / Treatments   Labs (all labs ordered are listed, but only abnormal results are displayed) Labs Reviewed  COMPREHENSIVE METABOLIC PANEL WITH GFR - Abnormal; Notable for the following components:      Result  Value   Glucose, Bld 127 (*)    Creatinine, Ser 1.43 (*)    Alkaline Phosphatase 32 (*)    Total Bilirubin 1.3 (*)    All other components within normal limits  CBC WITH DIFFERENTIAL/PLATELET - Abnormal; Notable for the following components:   Platelets 107 (*)    Lymphs Abs 0.6 (*)    All other components within normal limits  URINALYSIS, W/ REFLEX TO CULTURE (INFECTION SUSPECTED) - Abnormal; Notable for the following components:   APPearance HAZY (*)    Hgb urine dipstick SMALL (*)    Nitrite POSITIVE (*)    Leukocytes,Ua LARGE (*)    Bacteria, UA MANY (*)    All other components within normal limits  I-STAT CHEM 8, ED - Abnormal; Notable for the following components:   Creatinine, Ser 1.50 (*)    Glucose, Bld 128 (*)    All other components within normal limits  CULTURE, BLOOD (ROUTINE X 2)  CULTURE, BLOOD (ROUTINE  X 2)  URINE CULTURE  PROTIME-INR  LIPASE, BLOOD  I-STAT CG4 LACTIC ACID, ED  GC/CHLAMYDIA PROBE AMP ([place]) NOT AT [facility]    EKG None  Radiology CT ABDOMEN PELVIS W CONTRAST Result Date: 08/07/2023 CLINICAL DATA:  Fever and flank pain EXAM: CT ABDOMEN AND PELVIS WITH CONTRAST TECHNIQUE: Multidetector CT imaging of the abdomen and pelvis was performed using the standard protocol following bolus administration of intravenous contrast. RADIATION DOSE REDUCTION: This exam was performed according to the departmental dose-optimization program which includes automated exposure control, adjustment of the mA and/or kV according to patient size and/or use of iterative reconstruction technique. CONTRAST:  OMNIPAQUE  IOHEXOL  300 MG/ML  SOLN COMPARISON:  09/04/2022 FINDINGS: Lower chest:  No contributory findings. Hepatobiliary: Tiny cystic density in the upper right lobe liver, unchanged.Cholelithiasis without signs of cholecystitis. No biliary dilatation Pancreas: Unremarkable. Spleen: Unremarkable. Adrenals/Urinary Tract: Negative adrenals. No hydronephrosis or stone. Unremarkable bladder. Stomach/Bowel:  No obstruction. No evidence of bowel inflammation. Vascular/Lymphatic: No acute vascular abnormality. No mass or adenopathy. Reproductive:No pathologic findings. Other: No ascites or pneumoperitoneum. Musculoskeletal: No acute abnormalities. Presumably cosmetic subcutaneous nodularity expanding the bilateral gluteal fat. IMPRESSION: No acute finding. Cholelithiasis. Electronically Signed   By: Ronnette Coke M.D.   On: 08/07/2023 08:03   DG Chest Port 1 View Result Date: 08/07/2023 CLINICAL DATA:  Questionable sepsis. Nausea, vomiting and bilateral flank pain with fevers. Difficulty urinating. EXAM: PORTABLE CHEST 1 VIEW COMPARISON:  08/30/2022 FINDINGS: The heart size and mediastinal contours are within normal limits. Both lungs are clear. The visualized skeletal structures are unremarkable.  IMPRESSION: No active disease. Electronically Signed   By: Kimberley Penman M.D.   On: 08/07/2023 07:48    Procedures Procedures    Medications Ordered in ED Medications  acetaminophen  (TYLENOL ) tablet 1,000 mg (1,000 mg Oral Not Given 08/07/23 0753)  sodium chloride  0.9 % bolus 1,000 mL (0 mLs Intravenous Stopped 08/07/23 0945)  ondansetron  (ZOFRAN ) injection 4 mg (4 mg Intravenous Given 08/07/23 0645)  morphine  (PF) 4 MG/ML injection 4 mg (4 mg Intravenous Given 08/07/23 0645)  sodium chloride  0.9 % bolus 1,000 mL (0 mLs Intravenous Stopped 08/07/23 0945)  ciprofloxacin  (CIPRO ) IVPB 400 mg (0 mg Intravenous Stopped 08/07/23 0945)  iohexol  (OMNIPAQUE ) 300 MG/ML solution 100 mL (100 mLs Intravenous Contrast Given 08/07/23 0735)    ED Course/ Medical Decision Making/ A&P Clinical Course as of 08/07/23 1001  Sat Aug 07, 2023  0909 Feeling a lot better. Passed PO challenge. Requesting D/c.  [  JB]    Clinical Course User Index [JB] Gavan Nordby, Kandace Organ, PA-C                                 Medical Decision Making Risk OTC drugs. Prescription drug management.   This patient presents to the ED for concern of abdominal pain, this involves an extensive number of treatment options, and is a complaint that carries with it a high risk of complications and morbidity.  The differential diagnosis includes cholecystitis, appendicitis, pancreatitis, kidney stone, urinary tract infection, electrolyte abnormality, dehydration, small bowel obstruction   Co morbidities that complicate the patient evaluation  HIV   Additional history obtained:  Additional history obtained from UC visit 07/28/23 which showed negative UA    Lab Tests:  I personally interpreted labs.  The pertinent results include:   CBC without leukocytosis and no significant anemia.  CMP with creatinine of 1.4 which is comparable to prior recent lab.  No elevation in liver enzymes, total bili 1.3.  UA is positive for blood, nitrate,  leukocytes with many white blood cells and bacteria.  Lactic 1.6 thus will not repeat Blood cultures and urine culture sent.   Imaging Studies ordered:  I ordered imaging studies including chest x-ray, CT abd/pelvis   I independently visualized and interpreted imaging which showed no acute findings on imaging I agree with the radiologist interpretation   Cardiac Monitoring: / EKG:  The patient was maintained on a cardiac monitor.  I personally viewed and interpreted the cardiac monitored which showed an underlying rhythm of: normal sinus    Problem List / ED Course / Critical interventions / Medication management  Patient reporting to emergency room with complaint of abdominal pain that has been present for about 3 days associated with foul smell of urine and dysuria.  On arrival patient is tachycardic at 110 and temperature is 99.5 F, apparently had fever with EMS but was not given anything.  Has not taken Tylenol  or ibuprofen  today.  Given reported fever and tachycardia blood cultures lactic obtained.  CBC does not show leukocytosis.  Patient does not have CVA tenderness.  He does have lower abdominal tenderness to palpation.  Chest x-ray does not show pneumonia or pneumothorax.  CT scan shows no acute abnormality.  Given workup feel symptoms are most consistent with urinary tract infection.  Patient's tachycardia has appropriately responded to fluids.  Patient is feeling much better.  Given first dose of antibiotic here.  Tolerating oral intake.  Feel he is appropriate for discharge and outpatient follow-up.  Will send antibiotic.  Given return precautions. I ordered medication including NS, Cipro  Reevaluation of the patient after these medicines showed that the patient improved I have reviewed the patients home medicines and have made adjustments as needed   Plan  F/u w/ PCP in 2-3d to ensure resolution of sx.  Patient was given return precautions. Patient stable for discharge at this  time.  Patient educated on sx/dx and verbalized understanding of plan. Return to ER w/ new or worsening sx.          Final Clinical Impression(s) / ED Diagnoses Final diagnoses:  Lower urinary tract infectious disease    Rx / DC Orders ED Discharge Orders          Ordered    ciprofloxacin  (CIPRO ) 500 MG tablet  Every 12 hours        08/07/23 0908    ondansetron  (  ZOFRAN -ODT) 4 MG disintegrating tablet  Every 8 hours PRN        08/07/23 0908              Ambyr Qadri, Kandace Organ, PA-C 08/07/23 1006    Iva Mariner, MD 08/07/23 1534

## 2023-08-07 NOTE — ED Provider Triage Note (Signed)
 Emergency Medicine Provider Triage Evaluation Note  Frederick Hess , a 38 y.o. adult  was evaluated in triage.  Pt complains of fever, abdominal pain, nausea/vomiting, dysuria. History HIV, denies urethral discharge or anal intercourse, compliant with his Biktarvy . EMS reports fever of 102.3.  Review of Systems  Positive:  Negative:   Physical Exam  BP (!) 119/44   Pulse (!) 109   Resp (!) 32   SpO2 94%  Gen:   Awake, no distress   Resp:  Normal effort  MSK:   Moves extremities without difficulty  Other:  Moderate generalized abdominal discomfort  Medical Decision Making  Medically screening exam initiated at 6:23 AM.  Appropriate orders placed.  Frederick Hess was informed that the remainder of the evaluation will be completed by another provider, this initial triage assessment does not replace that evaluation, and the importance of remaining in the ED until their evaluation is complete.  Undifferentiated sepsis orders initiated.   Darlis Eisenmenger, PA-C 08/07/23 [phone]

## 2023-08-10 LAB — URINE CULTURE: Culture: >=100000 — AB

## 2023-08-10 LAB — GC/CHLAMYDIA PROBE AMP (~~LOC~~) NOT AT ARMC
Chlamydia: NEGATIVE
Comment: NEGATIVE
Comment: NORMAL
Neisseria Gonorrhea: NEGATIVE

## 2023-08-11 ENCOUNTER — Telehealth (HOSPITAL_BASED_OUTPATIENT_CLINIC_OR_DEPARTMENT_OTHER): Payer: Self-pay | Admitting: *Deleted

## 2023-08-11 NOTE — Telephone Encounter (Signed)
 Post ED Visit - Positive Culture Follow-up  Culture report reviewed by antimicrobial stewardship pharmacist: [facility] []  Court Distance, Pharm.D. []  Skeet Duke, Pharm.D., BCPS AQ-ID []  Leslee Rase, Pharm.D., BCPS []  Garland Junk, Pharm.D., BCPS []  [place], [address].D., BCPS, AAHIVP []  Alcide Aly, Pharm.D., BCPS, AAHIVP []  Jerri Morale, PharmD, BCPS []  Graham Laws, PharmD, BCPS []  Cleda Curly, PharmD, BCPS []  Tamar Fairly, PharmD []  Ballard Levels, PharmD, BCPS []  Ollen Beverage, PharmD  [facility] [x]  Garland Junk, PharmD []  Sherryle Don, PharmD []  Van Gelinas, PharmD []  Delila Felty, Rph []  [place]) Cleora Daft, PharmD []  Augustina Block, PharmD []  Arie Kurtz, PharmD []  Sharlyn Deaner, PharmD []  Agnes Hose, PharmD []  Kendall Pauls, PharmD []  Gladstone Lamer, PharmD []  Armanda Bern, PharmD []  Tera Fellows, PharmD   Positive urine culture Treated with ciprofloxacin , organism sensitive to the same and no further patient follow-up is required at this time.  Zeb Heys 08/11/2023, 11:05 AM

## 2023-08-12 LAB — CULTURE, BLOOD (ROUTINE X 2)
Culture: NO GROWTH
Culture: NO GROWTH
Special Requests: ADEQUATE

## 2023-08-18 ENCOUNTER — Ambulatory Visit
Admission: EM | Admit: 2023-08-18 | Discharge: 2023-08-18 | Disposition: A | Discharge status: Home / Self Care | Attending: Physician Assistant | Admitting: Physician Assistant

## 2023-08-18 ENCOUNTER — Other Ambulatory Visit: Payer: Self-pay

## 2023-08-18 DIAGNOSIS — Z113 Encounter for screening for infections with a predominantly sexual mode of transmission: Secondary | ICD-10-CM | POA: Insufficient documentation

## 2023-08-18 DIAGNOSIS — N390 Urinary tract infection, site not specified: Secondary | ICD-10-CM | POA: Diagnosis not present

## 2023-08-18 DIAGNOSIS — R300 Dysuria: Secondary | ICD-10-CM | POA: Diagnosis not present

## 2023-08-18 DIAGNOSIS — R319 Hematuria, unspecified: Secondary | ICD-10-CM | POA: Diagnosis present

## 2023-08-18 DIAGNOSIS — R310 Gross hematuria: Secondary | ICD-10-CM | POA: Diagnosis present

## 2023-08-18 LAB — POCT URINALYSIS DIP (MANUAL ENTRY)
Bilirubin, UA: NEGATIVE
Glucose, UA: NEGATIVE mg/dL
Ketones, POC UA: NEGATIVE mg/dL
Nitrite, UA: NEGATIVE
Protein Ur, POC: NEGATIVE mg/dL
Spec Grav, UA: 1.015 (ref 1.010–1.025)
Urobilinogen, UA: 0.2 U/dL
pH, UA: 5.5 (ref 5.0–8.0)

## 2023-08-18 MED ORDER — SULFAMETHOXAZOLE-TRIMETHOPRIM 800-160 MG PO TABS: 1.0000 | ORAL_TABLET | Freq: Two times a day (BID) | ORAL | 0 refills | Status: AC

## 2023-08-18 NOTE — Discharge Instructions (Addendum)
 You are seen today for concerns of abdominal pain as well as blood in the urine and discomfort when urinating.  Your urine dip was consistent with a likely UTI.  I have sent in a medication called Bactrim  for you to take twice per day for 7 days to treat this.  I have sent in a sample of your urine for urine culture to discern which bacteria is causing your symptoms as well as the most appropriate antibiotic to treat this.  If any changes need to be made your medication regimen they will be sent into the pharmacy that we have on file. If desired you can use AZO to assist with pain during urination until your antibiotic reaches therapeutic effect.  We have completed a cytology swab to check for gonorrhea, chlamydia, and trichomonas. We have also drawn blood work to check for syphilis. We will keep you updated on these results once they are available and if any medications are indicated by this test results they will be sent in to the pharmacy on file or you will receive a call to set up an appointment for an injection here at urgent care. Please refrain from sexual activity until your test results are negative or until you have completed an appropriate medication regimen.  It is recommended that you use a condom or another barrier method to help prevent STD transmission going forward.  Please make sure that you are communicating your test results to your partners especially if there are any positives so that they can get requisite testing for themselves.

## 2023-08-18 NOTE — ED Triage Notes (Signed)
 Pt presents with complaints of lower abdominal pain and blood in urine. Pt states the abdominal pain has been ongoing for about two years. Pt's main concerns is the blood in urine. This has been going on for about two days. Pt was recently seen in hospital on 5/24. States the urine is "hot" when coming out. Pt states he has been having sexual relations with another male recently. Believes this is contributing to his symptoms.

## 2023-08-18 NOTE — ED Provider Notes (Signed)
 [name]    CSN: [id number] Arrival date & time: 08/18/23  1051      History   Chief Complaint Chief Complaint  Patient presents with   Abdominal Pain   Hematuria    X 2 days.     HPI Frederick Hess is a 38 y.o. adult.   HPI Pt reports concerns for blood in urine and dysuria He states dysuria has been present for several days and hematuria started about 2 days ago  He also admits to abdominal pain but denies nausea, vomiting He denies fever, chills  He does admit to back pain/ flank pain  He does admit to having to push with urination sometimes and also is having urinary hesitancy    Past Medical History:  Diagnosis Date   Hormone replacement therapy    Migraine    Transexualism     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      Home Medications    Prior to Admission medications   Medication Sig Start Date End Date Taking? Authorizing Provider  sulfamethoxazole -trimethoprim  (BACTRIM  DS) 800-160 MG tablet Take 1 tablet by mouth 2 (two) times daily for 7 days. 08/18/23 08/25/23 Yes Gorman Safi E, PA-C  Azelastine  HCl 137 MCG/SPRAY SOLN Place 1 spray into the nose 2 (two) times daily as needed. 06/03/23   Reddick,  Johnathan B, NP  benzonatate  (TESSALON ) 200 MG capsule Take 1 capsule (200 mg total) by mouth 3 (three) times daily as needed for cough. Patient not taking: Reported on 07/28/2023 06/03/23   Reddick, Johnathan B, NP  bictegravir-emtricitabine-tenofovir AF (BIKTARVY ) 50-200-25 MG TABS tablet Take 1 tablet by mouth daily. 05/11/23   Jamesetta Mcbride, PA-C  ibuprofen  (ADVIL ) 800 MG tablet Take 1 tablet (800 mg total) by mouth 3 (three) times daily. 06/03/23   Reddick, Johnathan B, NP  ondansetron  (ZOFRAN -ODT) 4 MG disintegrating tablet Take 1 tablet (4 mg total) by mouth every 8 (eight) hours as needed for nausea or vomiting. 08/07/23   Barrett, Kandace Organ, PA-C    Family History Family History  Problem Relation Age of Onset   Diabetes Mother     Social History Social History   Tobacco Use   Smoking status: Former    Current packs/day: 1.00    Types: Cigarettes   Smokeless tobacco: Never  Vaping Use   Vaping status: Never Used  Substance Use Topics   Alcohol use: Never   Drug use: Never    Types: Marijuana     Allergies   Amoxicillin    Review of Systems Review of Systems  Constitutional:  Negative for chills and fever.  Gastrointestinal:  Positive for abdominal pain. Negative for nausea and vomiting.  Genitourinary:  Positive for  difficulty urinating (reports he has to push sometimes), dysuria, flank pain, frequency and hematuria.     Physical Exam Triage Vital Signs ED Triage Vitals  Encounter Vitals Group     BP 08/18/23 1158 120/80     Systolic BP Percentile --      Diastolic BP Percentile --      Pulse Rate 08/18/23 1158 71     Resp 08/18/23 1158 18     Temp 08/18/23 1158 98.4 F (36.9 C)     Temp Source 08/18/23 1158 Oral     SpO2 08/18/23 1158 98 %     Weight 08/18/23 1200 195 lb (88.5 kg)     Height 08/18/23 1200 6\' 1"  (1.854 m)     Head Circumference --      Peak Flow --      Pain Score 08/18/23 1200 6     Pain Loc --      Pain Education --      Exclude  from Growth Chart --    No data found.  Updated Vital Signs BP 120/80 (BP Location: Right Arm)   Pulse 71   Temp 98.4 F (36.9 C) (Oral)   Resp 18   Ht 6\' 1"  (1.854 m)   Wt 195 lb (88.5 kg)   SpO2 98%   BMI 25.73 kg/m   Visual Acuity Right Eye Distance:   Left Eye Distance:   Bilateral Distance:    Right Eye Near:   Left Eye Near:    Bilateral Near:     Physical Exam Vitals reviewed.  Constitutional:      General: He is awake.     Appearance: Normal appearance. He is well-developed and well-groomed.  HENT:     Head: Normocephalic and atraumatic.  Eyes:     General: Lids are normal. Gaze aligned appropriately.     Extraocular Movements: Extraocular movements intact.     Conjunctiva/sclera: Conjunctivae normal.  Pulmonary:     Effort: Pulmonary effort is normal.  Neurological:     General: No focal deficit present.     Mental Status: He is alert and oriented to person, place, and time.     GCS: GCS eye subscore is 4. GCS verbal subscore is 5. GCS motor subscore is 6.     Cranial Nerves: No cranial nerve deficit, dysarthria or facial asymmetry.  Psychiatric:        Attention and Perception: Attention and perception normal.        Mood and Affect: Mood and affect normal.        Speech: Speech normal.        Behavior: Behavior normal. Behavior is cooperative.      UC Treatments / Results  Labs (all labs ordered are listed, but only abnormal results are displayed) Labs Reviewed  POCT URINALYSIS DIP (MANUAL ENTRY) - Abnormal; Notable for the following components:      Result Value   Clarity, UA cloudy (*)    Blood, UA trace-lysed (*)    Leukocytes, UA Small (1+) (*)    All other components within normal limits  URINE CULTURE  RPR  CYTOLOGY, (ORAL, ANAL, URETHRAL) ANCILLARY ONLY  CYTOLOGY, (ORAL, ANAL, URETHRAL) ANCILLARY ONLY    EKG   Radiology No results found.  Procedures Procedures (including critical care time)  Medications Ordered in  UC Medications - No data to display  Initial Impression / Assessment and Plan / UC Course  I have reviewed the triage vital signs and the nursing  notes.  Pertinent labs & imaging results that were available during my care of the patient were reviewed by me and considered in my medical decision making (see chart for details).     I reviewed patient's encounter notes from 08/07/2023 including lab results, CT and CXR results.  Urine culture revealed infection with E. coli which was susceptible to ciprofloxacin  which patient was sent home on.  Final Clinical Impressions(s) / UC Diagnoses   Final diagnoses:  Screening for STDs (sexually transmitted diseases)  Dysuria  Gross hematuria  Urinary tract infection with hematuria, site unspecified   Patient presents today with concerns for dysuria as well as gross hematuria.  He reports that this has been ongoing for about 2 days.  He is sexually active and reports concerns for potential STD infection and would like testing for rule out.  Urethral and rectal cytology swabs collected to assess for gonorrhea, chlamydia, trichomonas.  Results to dictate further management.  Urine dip was notable for blood as well as leukocytes.  Will send urine culture off for definitive ID and susceptibility testing.  Given symptoms I suspect potential UTI so we will treat with Bactrim  p.o. twice daily x 7 days.  Patient declines Pyridium prescription today.  Will also collect RPR for syphilis rule out.  Recommend refraining from sexual activity until test results are returned and are negative or he has completed an appropriate medication regimen as dictated by results.  Follow-up as needed for persistent or progressing symptoms.    Discharge Instructions      You are seen today for concerns of abdominal pain as well as blood in the urine and discomfort when urinating.  Your urine dip was consistent with a likely UTI.  I have sent in a medication called Bactrim  for you  to take twice per day for 7 days to treat this.  I have sent in a sample of your urine for urine culture to discern which bacteria is causing your symptoms as well as the most appropriate antibiotic to treat this.  If any changes need to be made your medication regimen they will be sent into the pharmacy that we have on file. If desired you can use AZO to assist with pain during urination until your antibiotic reaches therapeutic effect.  We have completed a cytology swab to check for gonorrhea, chlamydia, and trichomonas. We have also drawn blood work to check for syphilis. We will keep you updated on these results once they are available and if any medications are indicated by this test results they will be sent in to the pharmacy on file or you will receive a call to set up an appointment for an injection here at urgent care. Please refrain from sexual activity until your test results are negative or until you have completed an appropriate medication regimen.  It is recommended that you use a condom or another barrier method to help prevent STD transmission going forward.  Please make sure that you are communicating your test results to your partners especially if there are any positives so that they can get requisite testing for themselves.    ED Prescriptions     Medication Sig Dispense Auth. Provider   sulfamethoxazole -trimethoprim  (BACTRIM  DS) 800-160 MG tablet Take 1 tablet by mouth 2 (two) times daily for 7 days. 14 tablet Barlow Harrison E, PA-C      PDMP not reviewed this encounter.   Jerona Mooring, PA-C 08/18/23 1615

## 2023-08-19 LAB — RPR: RPR Ser Ql: NONREACTIVE

## 2023-08-20 ENCOUNTER — Ambulatory Visit (HOSPITAL_COMMUNITY): Payer: Self-pay

## 2023-08-20 LAB — URINE CULTURE: Culture: 20000 — AB

## 2023-08-20 LAB — CYTOLOGY, (ORAL, ANAL, URETHRAL) ANCILLARY ONLY
Chlamydia: NEGATIVE
Chlamydia: NEGATIVE
Comment: NEGATIVE
Comment: NEGATIVE
Comment: NORMAL
Comment: NEGATIVE
Comment: NORMAL
Neisseria Gonorrhea: NEGATIVE
Neisseria Gonorrhea: NEGATIVE
Trichomonas: NEGATIVE

## 2023-08-20 MED ORDER — NITROFURANTOIN MONOHYD MACRO 100 MG PO CAPS: 100.0000 mg | ORAL_CAPSULE | Freq: Two times a day (BID) | ORAL | 0 refills | Status: DC

## 2023-08-20 MED ORDER — NITROFURANTOIN MONOHYD MACRO 100 MG PO CAPS
100.0000 mg | ORAL_CAPSULE | Freq: Two times a day (BID) | ORAL | 0 refills | Status: DC
Start: 2023-08-20 — End: 2023-09-13

## 2023-08-30 ENCOUNTER — Ambulatory Visit
Admission: EM | Admit: 2023-08-30 | Discharge: 2023-08-30 | Disposition: A | Discharge status: Home / Self Care | Attending: Family Medicine | Admitting: Family Medicine

## 2023-08-30 DIAGNOSIS — N390 Urinary tract infection, site not specified: Secondary | ICD-10-CM | POA: Insufficient documentation

## 2023-08-30 DIAGNOSIS — B360 Pityriasis versicolor: Secondary | ICD-10-CM | POA: Diagnosis not present

## 2023-08-30 LAB — POCT URINALYSIS DIP (MANUAL ENTRY)
Bilirubin, UA: NEGATIVE
Glucose, UA: NEGATIVE mg/dL
Ketones, POC UA: NEGATIVE mg/dL
Nitrite, UA: POSITIVE — AB
Protein Ur, POC: NEGATIVE mg/dL
Spec Grav, UA: 1.02 (ref 1.010–1.025)
Urobilinogen, UA: 2 U/dL — AB
pH, UA: 5.5 (ref 5.0–8.0)

## 2023-08-30 MED ORDER — DOXYCYCLINE HYCLATE 100 MG PO CAPS: 100.0000 mg | ORAL_CAPSULE | Freq: Two times a day (BID) | ORAL | 0 refills | Status: DC

## 2023-08-30 MED ORDER — FLUCONAZOLE 150 MG PO TABS: 300.0000 mg | ORAL_TABLET | ORAL | 0 refills | Status: AC

## 2023-08-30 NOTE — ED Triage Notes (Signed)
 Patient presents with Dysuria x 3 days. Patient states he has been treated twice for the same symptoms.

## 2023-08-30 NOTE — ED Triage Notes (Signed)
 Also, reports lower abdominal pain.

## 2023-08-30 NOTE — ED Provider Notes (Signed)
 [facility]  Note:  This document was prepared using Conservation officer, historic buildings and may include unintentional dictation errors.  MRN: [id number] DOB: 1985-12-09  Subjective:   Frederick Hess is a 38 y.o. adult with past medical history of HIV presenting for recheck on persistent lower abdominal pain, dysuria.  Was seen on 07/28/2023, 08/07/2023, 08/18/2023.  Initially urinalysis was completely normal.  Subsequently through the emergency room, was found to have complicated urinary tract infection.  CT imaging then was equivocal.  Patient was treated with IV ciprofloxacin  and followed up with outpatient ciprofloxacin .  Subsequently at his last visit was started on Bactrim .  Urine culture confirmed resistance to this and was switched to 5 days of Macrobid .  Patient did complete this course and had very temporary relief before the return of his symptoms.  Denies hematuria, urinary frequency, penile discharge, penile swelling, testicular pain, testicular swelling, anal pain, groin pain.  He has not been sexually active except for the past week, had sex about 5 days ago.  His cytology testing has been negative however.  No current facility-administered medications for this encounter.  Current Outpatient Medications:    Azelastine  HCl 137 MCG/SPRAY SOLN, Place 1 spray into the nose 2 (two) times daily as needed., Disp: 30 mL, Rfl: 1   bictegravir-emtricitabine-tenofovir AF (BIKTARVY ) 50-200-25 MG TABS tablet, Take 1 tablet by mouth daily., Disp: 30 tablet, Rfl: 4   ibuprofen  (ADVIL ) 800 MG tablet, Take 1 tablet (800 mg total) by mouth 3 (three) times daily., Disp: 21 tablet, Rfl: 0   benzonatate  (TESSALON ) 200 MG capsule, Take 1 capsule (200 mg total) by mouth 3 (three) times daily as needed for cough. (Patient not taking: Reported on 07/28/2023), Disp: 20 capsule, Rfl: 0   nitrofurantoin , macrocrystal-monohydrate, (MACROBID ) 100 MG capsule, Take 1 capsule (100 mg  total) by mouth 2 (two) times daily., Disp: 10 capsule, Rfl: 0   ondansetron  (ZOFRAN -ODT) 4 MG disintegrating tablet, Take 1 tablet (4 mg total) by mouth every 8 (eight) hours as needed for nausea or vomiting., Disp: 20 tablet, Rfl: 0   Allergies  Allergen Reactions   Amoxicillin  Anaphylaxis and Hives    Has patient had a PCN reaction causing immediate rash, facial/tongue/throat swelling, SOB or lightheadedness with hypotension: Yes Has patient had a PCN reaction causing severe rash involving mucus membranes or skin necrosis: Yes Has patient had a PCN reaction that required hospitalization: ED visit Has patient had a PCN reaction occurring within the last 10 years: Yes, in 2015 If all of the above answers are NO, then may proceed with Cephalosporin use.     Past Medical History:  Diagnosis Date   Hormone replacement therapy    Migraine    Transexualism      Past Surgical History:  Procedure Laterality Date   COSMETIC SURGERY     rhinoplasty   RHINOPLASTY      Family History  Problem Relation Age of Onset   Diabetes Mother     Social History   Tobacco Use   Smoking status: Former    Current packs/day: 1.00    Types: Cigarettes   Smokeless tobacco: Never  Vaping Use   Vaping status: Never Used  Substance Use Topics   Alcohol use: Never   Drug use: Never    ROS   Objective:   Vitals: BP (!) 147/89 (BP Location: Left Arm)   Pulse 78   Temp 98.2 F (36.8 C) (Oral)   Resp 18  SpO2 98%   Physical Exam Constitutional:      General: He is not in acute distress.    Appearance: Normal appearance. He is well-developed. He is not ill-appearing, toxic-appearing or diaphoretic.  HENT:     Head: Normocephalic and atraumatic.     Nose: Nose normal.     Mouth/Throat:     Mouth: Mucous membranes are moist.   Eyes:     General: No scleral icterus.       Right eye: No discharge.        Left eye: No discharge.     Extraocular Movements: Extraocular movements  intact.     Conjunctiva/sclera: Conjunctivae normal.    Cardiovascular:     Rate and Rhythm: Normal rate.  Pulmonary:     Effort: Pulmonary effort is normal.  Abdominal:     General: Bowel sounds are normal. There is no distension.     Palpations: Abdomen is soft. There is no mass.     Tenderness: There is no abdominal tenderness. There is no right CVA tenderness, left CVA tenderness, guarding or rebound.   Skin:    General: Skin is warm and dry.     Comments: Large patches of hyper and hypopigmented lesions diffusely scattered over the upper back extending toward the neck.   Neurological:     General: No focal deficit present.     Mental Status: He is alert and oriented to person, place, and time.   Psychiatric:        Mood and Affect: Mood normal.        Behavior: Behavior normal.        Thought Content: Thought content normal.        Judgment: Judgment normal.     Results for orders placed or performed during the hospital encounter of 08/30/23 (from the past 24 hours)  POCT urinalysis dipstick     Status: Abnormal   Collection Time: 08/30/23  4:07 PM  Result Value Ref Range   Color, UA yellow yellow   Clarity, UA cloudy (A) clear   Glucose, UA negative negative mg/dL   Bilirubin, UA negative negative   Ketones, POC UA negative negative mg/dL   Spec Grav, UA 0.454 0.981 - 1.025   Blood, UA trace-intact (A) negative   pH, UA 5.5 5.0 - 8.0   Protein Ur, POC negative negative mg/dL   Urobilinogen, UA 2.0 (A) 0.2 or 1.0 E.U./dL   Nitrite, UA Positive (A) Negative   Leukocytes, UA Small (1+) (A) Negative   Recent Results (from the past 2160 hours)  POC Covid19/Flu A&B Antigen     Status: None   Collection Time: 06/03/23  8:57 AM  Result Value Ref Range   Influenza A Antigen, POC Negative Negative   Influenza B Antigen, POC Negative Negative   Covid Antigen, POC Negative Negative  POCT rapid strep A     Status: None   Collection Time: 06/03/23  8:57 AM  Result Value  Ref Range   Rapid Strep A Screen Negative Negative  CT/NG RNA, TMA Rectal     Status: None   Collection Time: 07/06/23  1:25 AM   Specimen: Sterile Swab  Result Value Ref Range   Chlamydia Trachomatis RNA NOT DETECTED NOT DETECTED   Neisseria Gonorrhoeae RNA NOT DETECTED NOT DETECTED    Comment: The analytical performance characteristics of this assay have been determined by [employer]. The modifications have not been cleared or approved by the FDA. This assay has been  validated pursuant to the CLIA regulations and is used for clinical purposes. Aaron Aas   GC/CT Probe, Amp (Throat)     Status: None   Collection Time: 07/06/23  1:25 AM   Specimen: Sterile Swab  Result Value Ref Range   Chlamydia trachomatis RNA NOT DETECTED NOT DETECTED   Neisseria gonorrhoeae RNA NOT DETECTED NOT DETECTED    Comment: The analytical performance characteristics of this assay have been determined by [employer]. The modifications have not been cleared or approved by the FDA. This assay has been validated pursuant to the CLIA regulations and is used for clinical purposes. .   RPR     Status: None   Collection Time: 07/06/23  1:25 AM  Result Value Ref Range   RPR Ser Ql NON-REACTIVE NON-REACTIVE    Comment: . No laboratory evidence of syphilis. If recent exposure is suspected, submit a new sample in 2-4 weeks. Tita Form trachomatis/N. gonorrhoeae RNA     Status: None   Collection Time: 07/06/23  1:26 AM   Specimen: Urine  Result Value Ref Range   C. trachomatis RNA, TMA NOT DETECTED NOT DETECTED   N. gonorrhoeae RNA, TMA NOT DETECTED NOT DETECTED    Comment: The analytical performance characteristics of this assay, when used to test SurePath(TM) specimens have been determined by [employer]. The modifications have not been cleared or approved by the FDA. This assay has been validated pursuant to the CLIA regulations and is used for clinical purposes. . For additional information,  please refer to https://education.[employer].com/faq/FAQ154 (This link is being provided for information/ educational purposes only.) .   POCT urinalysis dipstick     Status: None   Collection Time: 07/28/23  5:39 PM  Result Value Ref Range   Color, UA yellow yellow   Clarity, UA clear clear   Glucose, UA negative negative mg/dL   Bilirubin, UA negative negative   Ketones, POC UA negative negative mg/dL   Spec Grav, UA 4.098 1.191 - 1.025   Blood, UA negative negative   pH, UA 6.5 5.0 - 8.0   Protein Ur, POC negative negative mg/dL   Urobilinogen, UA 0.2 0.2 or 1.0 E.U./dL   Nitrite, UA Negative Negative   Leukocytes, UA Negative Negative  Blood Culture (routine x 2)     Status: None   Collection Time: 08/07/23  6:25 AM   Specimen: BLOOD LEFT FOREARM  Result Value Ref Range   Specimen Description      BLOOD LEFT FOREARM Performed at [facility], [address], [place], [place] [address]    Special Requests      BOTTLES DRAWN AEROBIC AND ANAEROBIC Blood Culture adequate volume Performed at [facility], [address], [place], [place] [address]    Culture      NO GROWTH 5 DAYS Performed at [facility], [address], [place], [place] [address]    Report Status 08/12/2023 FINAL   Comprehensive metabolic panel     Status: Abnormal   Collection Time: 08/07/23  6:27 AM  Result Value Ref Range   Sodium 138 135 - 145 mmol/L   Potassium 3.5 3.5 - 5.1 mmol/L   Chloride 108 98 - 111 mmol/L   CO2 24 22 - 32 mmol/L   Glucose, Bld 127 (H) 70 - 99 mg/dL    Comment: Glucose reference range applies only to samples taken after fasting for at least 8 hours.   BUN 18 6 - 20 mg/dL  Creatinine, Ser 1.43 (H) 0.61 - 1.24 mg/dL   Calcium 9.0 8.9 - 16.1 mg/dL   Total Protein 7.4 6.5 - 8.1 g/dL   Albumin 3.8 3.5 - 5.0 g/dL   AST 24 15 - 41 U/L   ALT 18 0 - 44 U/L   Alkaline Phosphatase 32 (L) 38 - 126 U/L   Total Bilirubin 1.3 (H)  0.0 - 1.2 mg/dL   GFR, Estimated >09 >60 mL/min    Comment: (NOTE) Calculated using the CKD-EPI Creatinine Equation (2021)    Anion gap 6 5 - 15    Comment: Performed at [facility], [address], [place], [place] [address]  CBC with Differential     Status: Abnormal   Collection Time: 08/07/23  6:27 AM  Result Value Ref Range   WBC 5.6 4.0 - 10.5 K/uL   RBC 4.59 4.22 - 5.81 MIL/uL   Hemoglobin 13.9 13.0 - 17.0 g/dL   HCT 81.1 91.4 - 78.2 %   MCV 88.5 80.0 - 100.0 fL   MCH 30.3 26.0 - 34.0 pg   MCHC 34.2 30.0 - 36.0 g/dL   RDW 95.6 21.3 - 08.6 %   Platelets 107 (L) 150 - 400 K/uL   nRBC 0.0 0.0 - 0.2 %   Neutrophils Relative % 85 %   Neutro Abs 4.8 1.7 - 7.7 K/uL   Lymphocytes Relative 11 %   Lymphs Abs 0.6 (L) 0.7 - 4.0 K/uL   Monocytes Relative 4 %   Monocytes Absolute 0.2 0.1 - 1.0 K/uL   Eosinophils Relative 0 %   Eosinophils Absolute 0.0 0.0 - 0.5 K/uL   Basophils Relative 0 %   Basophils Absolute 0.0 0.0 - 0.1 K/uL   Immature Granulocytes 0 %   Abs Immature Granulocytes 0.01 0.00 - 0.07 K/uL    Comment: Performed at [facility], [address] [place], [place] [address]  Protime-INR     Status: None   Collection Time: 08/07/23  6:27 AM  Result Value Ref Range   Prothrombin Time 13.8 11.4 - 15.2 seconds   INR 1.0 0.8 - 1.2    Comment: (NOTE) INR goal varies based on device and disease states. Performed at [facility], [address] [place], [place] [address]   Blood Culture (routine x 2)     Status: None   Collection Time: 08/07/23  6:27 AM   Specimen: Right Antecubital; Blood  Result Value Ref Range   Specimen Description      RIGHT ANTECUBITAL Performed at [facility], [address], [place], [place] [address]    Special Requests      BOTTLES DRAWN AEROBIC AND ANAEROBIC Blood Culture results may not be optimal due to an inadequate volume of blood received in culture  bottles Performed at [facility], [address], [place], [place] [address]    Culture      NO GROWTH 5 DAYS Performed at [facility], [address], [place], [place] [address]    Report Status 08/12/2023 FINAL   Lipase, blood     Status: None   Collection Time: 08/07/23  6:27 AM  Result Value Ref Range   Lipase 29 11 - 51 U/L    Comment: Performed at [facility], [address], [place], [place] [address]  I-Stat Lactic Acid, ED     Status: None   Collection Time: 08/07/23  6:46 AM  Result Value Ref  Range   Lactic Acid, Venous 1.6 0.5 - 1.9 mmol/L  I-stat chem 8, ED     Status: Abnormal   Collection Time: 08/07/23  6:46 AM  Result Value Ref Range   Sodium 142 135 - 145 mmol/L   Potassium 3.7 3.5 - 5.1 mmol/L   Chloride 107 98 - 111 mmol/L   BUN 17 6 - 20 mg/dL   Creatinine, Ser 5.40 (H) 0.61 - 1.24 mg/dL   Glucose, Bld 981 (H) 70 - 99 mg/dL    Comment: Glucose reference range applies only to samples taken after fasting for at least 8 hours.   Calcium, Ion 1.23 1.15 - 1.40 mmol/L   TCO2 26 22 - 32 mmol/L   Hemoglobin 13.9 13.0 - 17.0 g/dL   HCT 19.1 47.8 - 29.5 %  Urinalysis, w/ Reflex to Culture (Infection Suspected) -Urine, Clean Catch     Status: Abnormal   Collection Time: 08/07/23  6:48 AM  Result Value Ref Range   Specimen Source URINE, CLEAN CATCH    Color, Urine YELLOW YELLOW   APPearance HAZY (A) CLEAR   Specific Gravity, Urine 1.011 1.005 - 1.030   pH 5.0 5.0 - 8.0   Glucose, UA NEGATIVE NEGATIVE mg/dL   Hgb urine dipstick SMALL (A) NEGATIVE   Bilirubin Urine NEGATIVE NEGATIVE   Ketones, ur NEGATIVE NEGATIVE mg/dL   Protein, ur NEGATIVE NEGATIVE mg/dL   Nitrite POSITIVE (A) NEGATIVE   Leukocytes,Ua LARGE (A) NEGATIVE   RBC / HPF 0-5 0 - 5 RBC/hpf   WBC, UA >50 0 - 5 WBC/hpf    Comment:        Reflex urine culture not performed if WBC <=10, OR if Squamous epithelial cells >5. If Squamous epithelial cells  >5 suggest recollection.    Bacteria, UA MANY (A) NONE SEEN   Squamous Epithelial / HPF 0-5 0 - 5 /HPF   WBC Clumps PRESENT    Mucus PRESENT    Budding Yeast PRESENT     Comment: Performed at [facility], [address] [place], [place] [address]  Urine Culture     Status: Abnormal   Collection Time: 08/07/23  6:48 AM   Specimen: Urine, Random  Result Value Ref Range   Specimen Description      URINE, RANDOM Performed at [facility], [address] [place], [place] [address]    Special Requests      NONE Reflexed from [id number] Performed at [facility], [address], [place], [place] [address]    Culture (A)     >=100,000 COLONIES/mL ESCHERICHIA COLI Confirmed Extended Spectrum Beta-Lactamase Producer (ESBL).  In bloodstream infections from ESBL organisms, carbapenems are preferred over piperacillin/tazobactam. They are shown to have a lower risk of mortality.    Report Status 08/10/2023 FINAL    Organism ID, Bacteria ESCHERICHIA COLI (A)       Susceptibility   Escherichia coli - MIC*    AMPICILLIN >=32 RESISTANT Resistant     CEFAZOLIN RESISTANT Resistant     CEFEPIME 0.25 SENSITIVE Sensitive     CEFTRIAXONE  32 RESISTANT Resistant     CIPROFLOXACIN  <=0.25 SENSITIVE Sensitive     GENTAMICIN <=1 SENSITIVE Sensitive     IMIPENEM <=0.25 SENSITIVE Sensitive     NITROFURANTOIN  <=16 SENSITIVE Sensitive     TRIMETH /SULFA  >=320 RESISTANT Resistant     AMPICILLIN/SULBACTAM 16 INTERMEDIATE Intermediate     PIP/TAZO <=4 SENSITIVE Sensitive ug/mL    * >=100,000 COLONIES/mL ESCHERICHIA  COLI  GC/Chlamydia probe amp ([place]) not at [facility]     Status: None   Collection Time: 08/07/23  7:00 AM  Result Value Ref Range   Neisseria Gonorrhea Negative    Chlamydia Negative    Comment Normal Reference Ranger Chlamydia - Negative    Comment      Normal Reference Range Neisseria Gonorrhea - Negative  POCT urinalysis dipstick      Status: Abnormal   Collection Time: 08/18/23 12:11 PM  Result Value Ref Range   Color, UA yellow yellow   Clarity, UA cloudy (A) clear   Glucose, UA negative negative mg/dL   Bilirubin, UA negative negative   Ketones, POC UA negative negative mg/dL   Spec Grav, UA 4.098 1.191 - 1.025   Blood, UA trace-lysed (A) negative   pH, UA 5.5 5.0 - 8.0   Protein Ur, POC negative negative mg/dL   Urobilinogen, UA 0.2 0.2 or 1.0 E.U./dL   Nitrite, UA Negative Negative   Leukocytes, UA Small (1+) (A) Negative  Urine Culture     Status: Abnormal   Collection Time: 08/18/23 12:47 PM   Specimen: Urine, Clean Catch  Result Value Ref Range   Specimen Description URINE, CLEAN CATCH    Special Requests      NONE Performed at [facility], [address] [place], [place] [address]    Culture (A)     20,000 COLONIES/mL ESCHERICHIA COLI Confirmed Extended Spectrum Beta-Lactamase Producer (ESBL).  In bloodstream infections from ESBL organisms, carbapenems are preferred over piperacillin/tazobactam. They are shown to have a lower risk of mortality.    Report Status 08/20/2023 FINAL    Organism ID, Bacteria ESCHERICHIA COLI (A)       Susceptibility   Escherichia coli - MIC*    AMPICILLIN >=32 RESISTANT Resistant     CEFAZOLIN >=64 RESISTANT Resistant     CEFEPIME 0.25 SENSITIVE Sensitive     CEFTRIAXONE  8 RESISTANT Resistant     CIPROFLOXACIN  0.5 INTERMEDIATE Intermediate     GENTAMICIN <=1 SENSITIVE Sensitive     IMIPENEM <=0.25 SENSITIVE Sensitive     NITROFURANTOIN  <=16 SENSITIVE Sensitive     TRIMETH /SULFA  >=320 RESISTANT Resistant     AMPICILLIN/SULBACTAM >=32 RESISTANT Resistant     PIP/TAZO <=4 SENSITIVE Sensitive ug/mL    * 20,000 COLONIES/mL ESCHERICHIA COLI  Cytology Ancillary Only -Anal; GC / Chlamydia     Status: None   Collection Time: 08/18/23 12:54 PM  Result Value Ref Range   Neisseria Gonorrhea Negative    Chlamydia Negative    Comment Normal Reference Ranger Chlamydia  - Negative    Comment      Normal Reference Range Neisseria Gonorrhea - Negative  RPR     Status: None   Collection Time: 08/18/23  1:00 PM  Result Value Ref Range   RPR Ser Ql Non Reactive Non Reactive  Cytology Ancillary Only -Urethral; GC / Chlamydia, Trichomonas     Status: None   Collection Time: 08/18/23  1:09 PM  Result Value Ref Range   Neisseria Gonorrhea Negative    Chlamydia Negative    Trichomonas Negative    Comment Normal Reference Ranger Chlamydia - Negative    Comment      Normal Reference Range Neisseria Gonorrhea - Negative   Comment Normal Reference Range Trichomonas - Negative   POCT urinalysis dipstick     Status: Abnormal   Collection Time: 08/30/23  4:07 PM  Result Value Ref Range   Color, UA  yellow yellow   Clarity, UA cloudy (A) clear   Glucose, UA negative negative mg/dL   Bilirubin, UA negative negative   Ketones, POC UA negative negative mg/dL   Spec Grav, UA 2.841 3.244 - 1.025   Blood, UA trace-intact (A) negative   pH, UA 5.5 5.0 - 8.0   Protein Ur, POC negative negative mg/dL   Urobilinogen, UA 2.0 (A) 0.2 or 1.0 E.U./dL   Nitrite, UA Positive (A) Negative   Leukocytes, UA Small (1+) (A) Negative    Assessment and Plan :   PDMP not reviewed this encounter.  1. Complicated UTI (urinary tract infection)   2. Tinea versicolor    Patient has a significant complicated urinary tract infection.  Continues to have urinary symptoms despite multiple rounds of antibiotics.  Urine cultures have confirmed multiple resistance E. coli infection.  Case discussed with urologist on-call.  Had extensive discussion about treatment options.  Possibly assume was considered but ultimately was advised to start doxycycline  to have urgent follow-up with his infectious disease clinic.  This recommendation and discussed it with the patient.  He will contact the infectious disease clinic ASAP tomorrow.  Patient is hemodynamically stable and appropriate for outpatient  management at this stage.   Adolph Hoop, [place] 08/30/23 1902

## 2023-08-30 NOTE — Discharge Instructions (Addendum)
 Please start taking doxycycline  and follow-up with your infectious disease specialist first thing tomorrow.  The urine culture and blood test results are pending.  Please also start fluconazole to address the rash of your back called tinea versicolor.

## 2023-08-31 ENCOUNTER — Other Ambulatory Visit: Payer: Self-pay

## 2023-08-31 ENCOUNTER — Emergency Department (HOSPITAL_COMMUNITY)

## 2023-08-31 ENCOUNTER — Emergency Department (HOSPITAL_COMMUNITY)
Admission: EM | Admit: 2023-08-31 | Discharge: 2023-08-31 | Disposition: A | Discharge status: Home / Self Care | Attending: Emergency Medicine | Admitting: Emergency Medicine

## 2023-08-31 ENCOUNTER — Telehealth: Payer: Self-pay

## 2023-08-31 ENCOUNTER — Ambulatory Visit (HOSPITAL_COMMUNITY): Payer: Self-pay

## 2023-08-31 DIAGNOSIS — Z21 Asymptomatic human immunodeficiency virus [HIV] infection status: Secondary | ICD-10-CM | POA: Diagnosis not present

## 2023-08-31 DIAGNOSIS — R300 Dysuria: Secondary | ICD-10-CM | POA: Insufficient documentation

## 2023-08-31 DIAGNOSIS — R1084 Generalized abdominal pain: Secondary | ICD-10-CM | POA: Insufficient documentation

## 2023-08-31 DIAGNOSIS — R319 Hematuria, unspecified: Secondary | ICD-10-CM | POA: Diagnosis not present

## 2023-08-31 DIAGNOSIS — R109 Unspecified abdominal pain: Secondary | ICD-10-CM | POA: Diagnosis present

## 2023-08-31 DIAGNOSIS — R110 Nausea: Secondary | ICD-10-CM | POA: Insufficient documentation

## 2023-08-31 LAB — COMPREHENSIVE METABOLIC PANEL WITH GFR
ALT: 9 IU/L (ref 0–44)
AST: 14 IU/L (ref 0–40)
Albumin: 4.3 g/dL (ref 4.1–5.1)
Alkaline Phosphatase: 39 IU/L — ABNORMAL LOW (ref 44–121)
BUN/Creatinine Ratio: 10 (ref 9–20)
BUN: 13 mg/dL (ref 6–20)
Bilirubin Total: 0.6 mg/dL (ref 0.0–1.2)
CO2: 22 mmol/L (ref 20–29)
Calcium: 9.2 mg/dL (ref 8.7–10.2)
Chloride: 107 mmol/L — ABNORMAL HIGH (ref 96–106)
Creatinine, Ser: 1.28 mg/dL — ABNORMAL HIGH (ref 0.76–1.27)
Globulin, Total: 2.7 g/dL (ref 1.5–4.5)
Glucose: 90 mg/dL (ref 70–99)
Potassium: 3.9 mmol/L (ref 3.5–5.2)
Sodium: 143 mmol/L (ref 134–144)
Total Protein: 7 g/dL (ref 6.0–8.5)
eGFR: 73 mL/min/{1.73_m2} (ref >59)

## 2023-08-31 LAB — CBC WITH DIFFERENTIAL/PLATELET
Basophils Absolute: 0 10*3/uL (ref 0.0–0.2)
Basos: 1 %
EOS (ABSOLUTE): 0.1 10*3/uL (ref 0.0–0.4)
Eos: 2 %
Hematocrit: 40.9 % (ref 37.5–51.0)
Hemoglobin: 13.6 g/dL (ref 13.0–17.7)
Immature Grans (Abs): 0 10*3/uL (ref 0.0–0.1)
Immature Granulocytes: 0 %
Lymphocytes Absolute: 1.7 10*3/uL (ref 0.7–3.1)
Lymphs: 39 %
MCH: 30.4 pg (ref 26.6–33.0)
MCHC: 33.3 g/dL (ref 31.5–35.7)
MCV: 91 fL (ref 79–97)
Monocytes Absolute: 0.4 10*3/uL (ref 0.1–0.9)
Monocytes: 9 %
Neutrophils Absolute: 2.1 10*3/uL (ref 1.4–7.0)
Neutrophils: 49 %
Platelets: 160 10*3/uL (ref 150–450)
RBC: 4.48 x10E6/uL (ref 4.14–5.80)
RDW: 14.9 % (ref 11.6–15.4)
WBC: 4.3 10*3/uL (ref 3.4–10.8)

## 2023-08-31 LAB — CBC
HCT: 41.7 % (ref 39.0–52.0)
Hemoglobin: 14.3 g/dL (ref 13.0–17.0)
MCH: 30.4 pg (ref 26.0–34.0)
MCHC: 34.3 g/dL (ref 30.0–36.0)
MCV: 88.7 fL (ref 80.0–100.0)
Platelets: 156 10*3/uL (ref 150–400)
RBC: 4.7 MIL/uL (ref 4.22–5.81)
RDW: 14.2 % (ref 11.5–15.5)
WBC: 4.7 10*3/uL (ref 4.0–10.5)
nRBC: 0 % (ref 0.0–0.2)

## 2023-08-31 LAB — BASIC METABOLIC PANEL WITH GFR
Anion gap: 8 (ref 5–15)
BUN: 11 mg/dL (ref 6–20)
CO2: 25 mmol/L (ref 22–32)
Calcium: 9.1 mg/dL (ref 8.9–10.3)
Chloride: 108 mmol/L (ref 98–111)
Creatinine, Ser: 1.22 mg/dL (ref 0.61–1.24)
GFR, Estimated: >60 mL/min (ref >60)
Glucose, Bld: 92 mg/dL (ref 70–99)
Potassium: 3.6 mmol/L (ref 3.5–5.1)
Sodium: 141 mmol/L (ref 135–145)

## 2023-08-31 LAB — URINALYSIS, ROUTINE W REFLEX MICROSCOPIC
Bacteria, UA: NONE SEEN
Bilirubin Urine: NEGATIVE
Glucose, UA: NEGATIVE mg/dL
Hgb urine dipstick: NEGATIVE
Ketones, ur: NEGATIVE mg/dL
Nitrite: NEGATIVE
Protein, ur: NEGATIVE mg/dL
Specific Gravity, Urine: 1.012 (ref 1.005–1.030)
pH: 8 (ref 5.0–8.0)

## 2023-08-31 LAB — HEPATIC FUNCTION PANEL
ALT: 12 U/L (ref 0–44)
AST: 15 U/L (ref 15–41)
Albumin: 4.2 g/dL (ref 3.5–5.0)
Alkaline Phosphatase: 29 U/L — ABNORMAL LOW (ref 38–126)
Bilirubin, Direct: 0.1 mg/dL (ref 0.0–0.2)
Indirect Bilirubin: 1.1 mg/dL — ABNORMAL HIGH (ref 0.3–0.9)
Total Bilirubin: 1.2 mg/dL (ref 0.0–1.2)
Total Protein: 7.4 g/dL (ref 6.5–8.1)

## 2023-08-31 LAB — DIFFERENTIAL
Abs Immature Granulocytes: 0.02 10*3/uL (ref 0.00–0.07)
Basophils Absolute: 0 10*3/uL (ref 0.0–0.1)
Basophils Relative: 0 %
Eosinophils Absolute: 0.1 10*3/uL (ref 0.0–0.5)
Eosinophils Relative: 1 %
Immature Granulocytes: 0 %
Lymphocytes Relative: 34 %
Lymphs Abs: 1.6 10*3/uL (ref 0.7–4.0)
Monocytes Absolute: 0.4 10*3/uL (ref 0.1–1.0)
Monocytes Relative: 9 %
Neutro Abs: 2.6 10*3/uL (ref 1.7–7.7)
Neutrophils Relative %: 56 %

## 2023-08-31 LAB — CYTOLOGY, (ORAL, ANAL, URETHRAL) ANCILLARY ONLY
Chlamydia: NEGATIVE
Comment: NEGATIVE
Comment: NEGATIVE
Comment: NORMAL
Neisseria Gonorrhea: NEGATIVE
Trichomonas: NEGATIVE

## 2023-08-31 LAB — LIPASE, BLOOD: Lipase: 32 U/L (ref 11–51)

## 2023-08-31 MED ORDER — IOHEXOL 300 MG/ML  SOLN
100.0000 mL | Freq: Once | INTRAMUSCULAR | Status: AC | PRN
  Administered 2023-08-31: 100 mL via INTRAVENOUS

## 2023-08-31 NOTE — ED Provider Notes (Signed)
  EMERGENCY DEPARTMENT AT [facility] Provider Note   CSN: [id number] Arrival date & time: 08/31/23  1510     Patient presents with: Flank Pain (Pt presents today with known UTI for three weeks and on atbx, changed yesterday. Pt was called by PCP today to come to ER because pt is nauseated and having back pain. Still having hematuria, increased urination, and urgency.)   Frederick Hess is a 38 y.o. adult.    Flank Pain Associated symptoms include abdominal pain.  Patient is a 38 year old male presents ED today with concerns for bilateral flank pain, nausea, abdominal pain, dysuria and hematuria that has been present for the last month, noted to be previously treated for complicated UTI with ciprofloxacin , Bactrim , Macrobid ..  Medical history of complicated UTI, HIV, hormone replacement therapy, migraine.    Talked to infectious disease today and distention Macrobid  x 7 days however was told that due to progression of symptoms to come to the ED for evaluation and possible IV antibiotics.  Reports that symptoms had improved after starting Macrobid  originally but that symptoms have returned 4 days ago and accompanied with bilateral flank pain, urinary urgency, and nausea.  Last episode of hematuria was last night.  Currently not experiencing any nausea.  Started taking antibiotics last night and today.  Saying that urinary symptoms have not been present since taking medication.  Denies fever, headache, vision changes, chest pain, shortness of breath, vomiting, diarrhea, hematochezia, melena, penile discharge, testicular pain, scrotal swelling, rashes, lower leg edema.    Prior to Admission medications   Medication Sig Start Date End Date Taking? Authorizing Provider  Azelastine  HCl 137 MCG/SPRAY SOLN Place 1 spray into the nose 2 (two) times daily as needed. 06/03/23   Reddick, Johnathan B, NP  benzonatate  (TESSALON ) 200 MG capsule Take 1 capsule (200 mg total) by  mouth 3 (three) times daily as needed for cough. Patient not taking: Reported on 07/28/2023 06/03/23   Reddick, Johnathan B, NP  bictegravir-emtricitabine-tenofovir AF (BIKTARVY ) 50-200-25 MG TABS tablet Take 1 tablet by mouth daily. 05/11/23   Jamesetta Mcbride, PA-C  doxycycline  (VIBRAMYCIN ) 100 MG capsule Take 1 capsule (100 mg total) by mouth 2 (two) times daily. 08/30/23   Adolph Hoop, PA-C  fluconazole (DIFLUCAN) 150 MG tablet Take 2 tablets (300 mg total) by mouth once a week. 08/30/23 09/14/23  Adolph Hoop, PA-C  ibuprofen  (ADVIL ) 800 MG tablet Take 1 tablet (800 mg total) by mouth 3 (three) times daily. 06/03/23   Reddick, Johnathan B, NP  nitrofurantoin , macrocrystal-monohydrate, (MACROBID ) 100 MG capsule Take 1 capsule (100 mg total) by mouth 2 (two) times daily. 08/20/23   Ann Keto, MD  ondansetron  (ZOFRAN -ODT) 4 MG disintegrating tablet Take 1 tablet (4 mg total) by mouth every 8 (eight) hours as needed for nausea or vomiting. 08/07/23   Barrett, Kandace Organ, PA-C    Allergies: Amoxicillin     Review of Systems  Gastrointestinal:  Positive for abdominal pain and nausea.  Genitourinary:  Positive for dysuria, flank pain, frequency, hematuria and urgency.  All other systems reviewed and are negative.   Updated Vital Signs BP 139/79 (BP Location: Left Arm)   Pulse 68   Temp 98.3 F (36.8 C) (Oral)   Resp 18   SpO2 99%   Physical Exam Vitals and nursing note reviewed.  Constitutional:      General: He is not in acute distress.    Appearance: Normal appearance. He is not ill-appearing or diaphoretic.  HENT:  Head: Normocephalic and atraumatic.     Mouth/Throat:     Mouth: Mucous membranes are moist.     Pharynx: Oropharynx is clear. No oropharyngeal exudate or posterior oropharyngeal erythema.   Eyes:     General: No scleral icterus.       Right eye: No discharge.        Left eye: No discharge.     Extraocular Movements: Extraocular movements intact.      Conjunctiva/sclera: Conjunctivae normal.    Cardiovascular:     Rate and Rhythm: Normal rate and regular rhythm.     Pulses: Normal pulses.     Heart sounds: Normal heart sounds. No murmur heard.    No friction rub. No gallop.  Pulmonary:     Effort: Pulmonary effort is normal. No respiratory distress.     Breath sounds: Normal breath sounds. No stridor. No wheezing, rhonchi or rales.  Abdominal:     General: Abdomen is flat. There is no distension.     Palpations: Abdomen is soft.     Tenderness: There is abdominal tenderness (Generalized abdominal tenderness to palpation). There is left CVA tenderness. There is no right CVA tenderness or guarding.   Musculoskeletal:     Cervical back: Normal range of motion and neck supple. No rigidity or tenderness.     Right lower leg: No edema.     Left lower leg: No edema.   Skin:    General: Skin is warm and dry.     Findings: No bruising, erythema or lesion.   Neurological:     General: No focal deficit present.     Mental Status: He is alert and oriented to person, place, and time. Mental status is at baseline.     Sensory: No sensory deficit.     Motor: No weakness.     Gait: Gait normal.   Psychiatric:        Mood and Affect: Mood normal.     (all labs ordered are listed, but only abnormal results are displayed) Labs Reviewed  URINALYSIS, ROUTINE W REFLEX MICROSCOPIC - Abnormal; Notable for the following components:      Result Value   Leukocytes,Ua TRACE (*)    All other components within normal limits  HEPATIC FUNCTION PANEL - Abnormal; Notable for the following components:   Alkaline Phosphatase 29 (*)    Indirect Bilirubin 1.1 (*)    All other components within normal limits  URINE CULTURE  BASIC METABOLIC PANEL WITH GFR  CBC  LIPASE, BLOOD  DIFFERENTIAL    EKG: None  Radiology: CT ABDOMEN PELVIS W CONTRAST Result Date: 08/31/2023 CLINICAL DATA:  Right lower quadrant pain EXAM: CT ABDOMEN AND PELVIS WITH  CONTRAST TECHNIQUE: Multidetector CT imaging of the abdomen and pelvis was performed using the standard protocol following bolus administration of intravenous contrast. RADIATION DOSE REDUCTION: This exam was performed according to the departmental dose-optimization program which includes automated exposure control, adjustment of the mA and/or kV according to patient size and/or use of iterative reconstruction technique. CONTRAST:  OMNIPAQUE  IOHEXOL  300 MG/ML  SOLN COMPARISON:  CT 08/07/2023 FINDINGS: Lower chest: No acute airspace disease. Hepatobiliary: Gallstone. No biliary dilatation. Subcentimeter hypodensity in the right hepatic lobe too small to further characterize Pancreas: Unremarkable. No pancreatic ductal dilatation or surrounding inflammatory changes. Spleen: Normal in size without focal abnormality. Adrenals/Urinary Tract: Adrenal glands are unremarkable. Kidneys are normal, without renal calculi, focal lesion, or hydronephrosis. Bladder is unremarkable. Stomach/Bowel: Stomach is within normal limits. Appendix not  well seen but no right lower quadrant inflammation. No evidence of bowel wall thickening, distention, or inflammatory changes. Vascular/Lymphatic: No significant vascular findings are present. No enlarged abdominal or pelvic lymph nodes. Reproductive: Prostate is unremarkable. Other: Negative for pelvic effusion or free air. Similar subcutaneous infiltration and nodularity at the hips and gluteal regions presumably related to prior cosmetic procedure Musculoskeletal: No acute osseous abnormality. IMPRESSION: 1. No CT evidence for acute intra-abdominal or pelvic abnormality. 2. Gallstone. 3. Similar subcutaneous infiltration and nodularity at the hips and gluteal regions presumably related to prior cosmetic procedure. Electronically Signed   By: Esmeralda Hedge M.D.   On: 08/31/2023 17:15     Procedures   Medications Ordered in the ED  iohexol  (OMNIPAQUE ) 300 MG/ML solution 100 mL  (100 mLs Intravenous Contrast Given 08/31/23 1654)                                  Medical Decision Making Amount and/or Complexity of Data Reviewed Labs: ordered.   This patient is a 38 year old male who presents to the ED for concern of complicated/recurrent UTI x approximately 1 month.  Told by infectious disease, to the ED for evaluation and possible IV antibiotics.  Currently experiencing bilateral flank pain, intermittent nausea, dysuria, hematuria, generalized lower abdominal pain.  On physical exam, patient is in no acute distress, afebrile, alert and orient x 4, speaking in full sentences, nontachypneic, nontachycardic.  Noted to have left-sided CVA tenderness, generalized lower abdominal tenderness to palpation.  Exam is otherwise unremarkable.  UA does not show any sign of infection, CBC unremarkable BMP unremarkable Function shows a mildly elevated indirect bilirubin otherwise unremarkable CT scan shows gallstone but no other acute intra-abdominal normality.  Upon further clarification, patient states that the nausea initially experience was when he took his antibiotics on empty stomach.  Spoke with Dr. Zelda Hickman with infectious disease, with patient currently having no signs of infection on UA as well as a negative CT with having already been treated with appropriate antibiotics at that time, she not believe that we need to provide any more treatment at this time.  Would recommend that he continues to follow-up with PCP.  Will have him continue to follow-up with infectious disease on June 30 with his already scheduled appointment.  Will have him continue to monitor symptoms and return to the ED for any new or worsening symptoms.  Will have him continue taking antibiotics that were prescribed by infectious disease to treat the UTI.  Patient vital signs have remained stable throughout the course of patient's time in the ED. Low suspicion for any other emergent pathology at this time.  I believe this patient is safe to be discharged. Provided strict return to ER precautions. Patient expressed agreement and understanding of plan. All questions were answered.  Differential diagnoses prior to evaluation: The emergent differential diagnosis includes, but is not limited to, appendicitis, pancreatitis, mesenteric ischemia, diverticulitis, nephrolithiasis, pyelonephritis, constipation, bowel obstruction, IBD, testicular torsion. This is not an exhaustive differential.   Past Medical History / Co-morbidities / Social History: Hormone replacement therapy, transsexualism, migraine, HIV, borderline personality disorder  Additional history: Chart reviewed. Pertinent results include:   Seen on 5/14, 5/24, 6/04, 08/30/2023 for lower abdominal pain and dysuria.  Noted to been treated with IV ciprofloxacin  and outpatient Cipro  floxacillin, but was treated at last visit with Bactrim .  Urine culture at that time did note resistance to this and was  switched to Macrobid .  Patient was noted to not have completed course and had temporary relief before symptoms returned.  Urologist was called at that time and patient was advised to start doxycycline  with follow-up to infectious disease.  Dr. Gillian Lacrosse provided macrobid  and told him to come to ED if experiencing nausea.   Noted to have a negative CT on 08/07/2023  Noted to have been a transgender male for 16 years but switched back to being male in 2023  Noted to have a urine culture that showed 20k of ESBL. Preferring carbapenems over piperacillin/tazobactam.   Lab Tests/Imaging studies: I personally interpreted labs/imaging and the pertinent results include:   CBC unremarkable UA shows trace leukocytes but otherwise unremarkable Hepatic function panel shows decreased alk phos as well as a mildly elevated indirect bilirubin but otherwise unremarkable CMP unremarkable CT shows gallstones but otherwise no acute abnormality.  I agree with the  radiologist interpretation.    Medications:  I have reviewed the patients home medicines and have made adjustments as needed.  Critical Interventions: none  Social Determinants of Health: none  Disposition: After consideration of the diagnostic results and the patients response to treatment, I feel that the patient would benefit from discharge and treatment as above.   emergency department workup does not suggest an emergent condition requiring admission or immediate intervention beyond what has been performed at this time. The plan is: Follow-up with infectious disease, return for new or worsening symptoms. The patient is safe for discharge and has been instructed to return immediately for worsening symptoms, change in symptoms or any other concerns.    Final diagnoses:  Dysuria    ED Discharge Orders     None          Vevelyn Gowers 08/31/23 1805    Trish Furl, MD 09/01/23 [phone]

## 2023-08-31 NOTE — Telephone Encounter (Signed)
 Patient seen by UC yesterday and was advised to call ID clinic for urgent follow up for complicated UTI.   No appointment availability until 6/30. Will route to provider.   Yacine Garriga, BSN, RN

## 2023-08-31 NOTE — Telephone Encounter (Signed)
 Called and spoke with Frederick Hess, he reports he is having back pain and severe nausea. Discussed that provider would recommend he go to the emergency room for IV antibiotics. He prefers to go to [facility].   Fareed Fung, BSN, RN

## 2023-08-31 NOTE — ED Notes (Signed)
 Patient transported to CT

## 2023-08-31 NOTE — Discharge Instructions (Addendum)
 You were seen today for painful urination with flank pain.  We have you currently take antibiotics that the urine culture showed that it was sensitive to, recommend continue to finish the antibiotic course and follow-up with infectious disease on June 30 with your already scheduled appointment.  Please be sure to continue to monitor your symptoms at home and if you any new or worsening symptoms despite antibiotics, return to the ED.  This would include symptoms such as fever, chest pain, shortness of breath, vision changes, vomiting, worsening flank pain, persistent painful urination or blood in the urine, worsening abdominal pain.

## 2023-09-01 LAB — URINE CULTURE
Culture: >=100000 — AB
Culture: <10000 — AB

## 2023-09-13 ENCOUNTER — Ambulatory Visit (INDEPENDENT_AMBULATORY_CARE_PROVIDER_SITE_OTHER): Admitting: Internal Medicine

## 2023-09-13 ENCOUNTER — Other Ambulatory Visit: Payer: Self-pay

## 2023-09-13 ENCOUNTER — Other Ambulatory Visit (HOSPITAL_COMMUNITY): Payer: Self-pay

## 2023-09-13 ENCOUNTER — Other Ambulatory Visit (HOSPITAL_COMMUNITY)
Admission: RE | Admit: 2023-09-13 | Discharge: 2023-09-13 | Disposition: A | Discharge status: Home / Self Care | Source: Ambulatory Visit | Attending: Internal Medicine | Admitting: Internal Medicine

## 2023-09-13 ENCOUNTER — Encounter: Payer: Self-pay | Admitting: Internal Medicine

## 2023-09-13 VITALS — BP 123/74 | HR 62 | Ht 73.0 in | Wt 205.0 lb

## 2023-09-13 DIAGNOSIS — Z129 Encounter for screening for malignant neoplasm, site unspecified: Secondary | ICD-10-CM

## 2023-09-13 DIAGNOSIS — Z113 Encounter for screening for infections with a predominantly sexual mode of transmission: Secondary | ICD-10-CM | POA: Insufficient documentation

## 2023-09-13 DIAGNOSIS — R102 Pelvic and perineal pain: Secondary | ICD-10-CM | POA: Diagnosis not present

## 2023-09-13 DIAGNOSIS — N411 Chronic prostatitis: Secondary | ICD-10-CM

## 2023-09-13 DIAGNOSIS — B20 Human immunodeficiency virus [HIV] disease: Secondary | ICD-10-CM

## 2023-09-13 DIAGNOSIS — Z7252 High risk homosexual behavior: Secondary | ICD-10-CM

## 2023-09-13 MED ORDER — NITROFURANTOIN MONOHYD MACRO 100 MG PO CAPS: 100.0000 mg | ORAL_CAPSULE | Freq: Two times a day (BID) | ORAL | 0 refills | Status: DC

## 2023-09-13 MED ORDER — NITROFURANTOIN MONOHYD MACRO 100 MG PO CAPS: 100.0000 mg | ORAL_CAPSULE | Freq: Two times a day (BID) | ORAL | 0 refills | Status: AC

## 2023-09-13 MED ORDER — BICTEGRAVIR-EMTRICITAB-TENOFOV 50-200-25 MG PO TABS: 1.0000 | ORAL_TABLET | Freq: Every day | ORAL | 11 refills | Status: AC

## 2023-09-13 NOTE — Patient Instructions (Signed)
 No sexual activity for 2 weeks  Finish 2 weeks macrobid   Get mri pelvis asap  See me in 2-3 weeks for review of symptoms and labs/mri   Anal cancer screening and all std's screening are done today

## 2023-09-13 NOTE — Progress Notes (Addendum)
 Thanks Arland  -------- Addendum Mri pelvis suggest prostatitis   Needs ertapenem for esbl ecoli -- will do 3 weeks  OPAT Orders Discharge antibiotics to be given via PICC line Discharge antibiotics: Ertapenem 1 gram iv daily  Duration: 3 weeks from start End Date: 3 weeks from start   [facility] Care Per Protocol:  Home health RN for IV administration and teaching; PICC line care and labs.    Labs weekly while on IV antibiotics: _x_ CBC with differential __ BMP _x_ CMP _x_ CRP __ ESR __ Vancomycin trough __ CK  __ Please pull PIC at completion of IV antibiotics __ Please leave PIC in place until doctor has seen patient or been notified  Fax weekly labs to [phone]

## 2023-09-13 NOTE — Progress Notes (Signed)
 Subjective:    Patient ID: Frederick Hess, adult    DOB: 11-06-1985, 38 y.o   MRN: [id number]  Chief Complaint  Patient presents with   Follow-up    B20     HPI:  Frederick Hess is a 38 y.o. adult living with HIV. He is adherent to Biktarvy  and tolerating well. He takes every morning without any missed doses.  He does not take any other OTC, supplements or vitamins. He is currently practicing celibacy. Treated for chlamydia 02/25/23. He has not been sexually active since that time, however, he wants to be tested for STIs today and start fresh and clean. He completed course of doxycycline  in December and has been asymptomatic since that time.  He declines all vaccinations today. Diagnosed with flu a few weeks ago, full recovery.   He is working currently at [employer]   09/13/23 id clinic visit See a&p Last sexual intercourse 2 days ago unprotected     Allergies  Allergen Reactions   Amoxicillin  Anaphylaxis and Hives    Has patient had a PCN reaction causing immediate rash, facial/tongue/throat swelling, SOB or lightheadedness with hypotension: Yes Has patient had a PCN reaction causing severe rash involving mucus membranes or skin necrosis: Yes Has patient had a PCN reaction that required hospitalization: ED visit Has patient had a PCN reaction occurring within the last 10 years: Yes, in 2015 If all of the above answers are NO, then may proceed with Cephalosporin use.       Outpatient Medications Prior to Visit  Medication Sig Dispense Refill   bictegravir-emtricitabine-tenofovir AF (BIKTARVY ) 50-200-25 MG TABS tablet Take 1 tablet by mouth daily. 30 tablet 4   doxycycline  (VIBRAMYCIN ) 100 MG capsule Take 1 capsule (100 mg total) by mouth 2 (two) times daily. 20 capsule 0   Azelastine  HCl 137 MCG/SPRAY SOLN Place 1 spray into the nose 2 (two) times daily as needed. 30 mL 1   benzonatate  (TESSALON ) 200 MG capsule Take 1 capsule (200 mg  total) by mouth 3 (three) times daily as needed for cough. (Patient not taking: Reported on 07/28/2023) 20 capsule 0   fluconazole (DIFLUCAN) 150 MG tablet Take 2 tablets (300 mg total) by mouth once a week. 4 tablet 0   ibuprofen  (ADVIL ) 800 MG tablet Take 1 tablet (800 mg total) by mouth 3 (three) times daily. 21 tablet 0   nitrofurantoin , macrocrystal-monohydrate, (MACROBID ) 100 MG capsule Take 1 capsule (100 mg total) by mouth 2 (two) times daily. 10 capsule 0   ondansetron  (ZOFRAN -ODT) 4 MG disintegrating tablet Take 1 tablet (4 mg total) by mouth every 8 (eight) hours as needed for nausea or vomiting. 20 tablet 0   No facility-administered medications prior to visit.     Past Medical History:  Diagnosis Date   Hormone replacement therapy    Migraine    Transexualism      Past Surgical History:  Procedure Laterality Date   COSMETIC SURGERY     rhinoplasty   RHINOPLASTY        ROS: All other ros negative    Objective:    BP 123/74   Pulse 62   Ht 6' 1 (1.854 m)   Wt 205 lb (93 kg)   SpO2 96%   BMI 27.05 kg/m  Nursing note and vital signs reviewed.  Physical Exam Vitals reviewed.  Constitutional:      Appearance: Normal appearance. He is normal weight.  HENT:     Head: Normocephalic  and atraumatic.     Mouth/Throat:     Mouth: Mucous membranes are moist.     Pharynx: Oropharynx is clear. No oropharyngeal exudate or posterior oropharyngeal erythema.   Cardiovascular:     Rate and Rhythm: Normal rate and regular rhythm.  Pulmonary:     Effort: Pulmonary effort is normal.     Breath sounds: Normal breath sounds.   Musculoskeletal:        General: Normal range of motion.     Cervical back: Normal range of motion.   Skin:    General: Skin is warm and dry.     Findings: No lesion or rash.   Neurological:     General: No focal deficit present.     Mental Status: He is alert and oriented to person, place, and time.   Psychiatric:        Mood and  Affect: Mood normal.        Behavior: Behavior normal.        Thought Content: Thought content normal.        Judgment: Judgment normal.         09/13/2023    3:55 PM 05/11/2023    1:32 PM 02/25/2023    1:53 PM 12/29/2022    8:42 AM 11/03/2022    8:38 AM  Depression screen PHQ 2/9  Decreased Interest 0 0 0 0 0  Down, Depressed, Hopeless 0 0 0 0 0  PHQ - 2 Score 0 0 0 0 0  Altered sleeping 0      Tired, decreased energy 0      Change in appetite 0      Feeling bad or failure about yourself  0      Trouble concentrating 0      Moving slowly or fidgety/restless 0      Suicidal thoughts 0      PHQ-9 Score 0           Assessment & Plan:   Problem List Items Addressed This Visit     HIV disease (HCC) - Primary   Relevant Medications   nitrofurantoin , macrocrystal-monohydrate, (MACROBID ) 100 MG capsule   Screening for STDs (sexually transmitted diseases)   Relevant Orders   Other/Misc lab test   Urine cytology ancillary only   Cytology (oral, anal, urethral) ancillary only   Cytology (oral, anal, urethral) ancillary only   Other Visit Diagnoses       High risk homosexual behavior         Pelvic pain       Relevant Orders   MR PELVIS WO CONTRAST   CBC   Basic Metabolic Panel (BMET)   C-reactive protein     Cancer screening       Relevant Orders   Cytology - PAP         #hiv Dx'ed 08/2022; hx transgender male switched back to male since 2023; msm Cd4 nadir 197 (16%) @ 09/2022 Therapy: 09/2022-c biktarvy    09/13/23 compliant with biktarvy  no missed dose last 4 weeks. Patient interested in cabenuva as he doesn't want to forget taking pills.   He has prior acute hep b where it's in remission/functional cure at this time. Ok to be on cabenuva   -discussed u=u -encourage compliance -continue current HIV medication -labs std testing today; hiv well controlled and can repeat next visit in 6 months -f/u in 6 months -refer to pharmacy for cabenuva eligibility  and initiation of he is    #high risk  sexual behavior #hx chlamydia 02/2023 #recent pelvic pain/uti-prostatitis sx No improvement of uti sx outside of hematuria despite macrobid  and recently doxy (doxy finished 4-5 days ago)  -he wanted to try doxy pep (discuss still not 100%) --- will revisit in a few weeks once we evaluate his pelvic pain/concern for prostatitis/prostate abscess -avoid sexual activity for now -mri pelvis -14 days macrobid  and we might have to go to iv abx if prostate infection on mri -encourage safe sexual practice   #social -working: Glass blower/designer -no smoking, drinking, or street drugs -not in relationship (single for 2.5 years); sexually active with male   #hx gu infection see above Bloody urine, lower abdominal pain, lower back pain, and dysuria since late may 2025 seen ed wl a few times and just finished antibiotics 4 days prior to this visit 09/13/23 6/17 ct with contrast normal prostate  He had received 5 days of macrobid  on 6/6 and then doxycycline . He still have sx outside of the bloody urine  No testicular pain  Hx not consistent with NGU   -mri pelvis  -macrobid  for 14 days -if mri positive will need iv abx   #hcm -vaccination 2024 tdap Doesn't want any vaccine -hepatitis 09/2022 functional cure hep b by serology -std See above -tb 09/2022 quantiferon gold negative -cancer screening Anal cancer screening today 09/13/23   Follow-up: Return in about 3 weeks (around 10/04/2023).

## 2023-09-13 NOTE — Addendum Note (Signed)
 Addended by: OVERTON FAITH T on: 09/13/2023 04:48 PM   Modules accepted: Orders

## 2023-09-14 ENCOUNTER — Encounter: Payer: Self-pay | Admitting: Pharmacist

## 2023-09-14 ENCOUNTER — Ambulatory Visit (HOSPITAL_COMMUNITY): Admission: RE | Admit: 2023-09-14 | Source: Ambulatory Visit

## 2023-09-14 NOTE — Addendum Note (Signed)
 Addended byBETHA OVERTON FAITH T on: 09/14/2023 02:51 PM   Modules accepted: Orders

## 2023-09-14 NOTE — Addendum Note (Signed)
 Addended by: OVERTON FAITH T on: 09/14/2023 01:26 PM   Modules accepted: Orders

## 2023-09-14 NOTE — Telephone Encounter (Signed)
 Patient returned call right away this morning. States he would like to continue with Biktarvy  at this time and may consider the injections later in the future. He states he has some concerns with switching to the injection at this time. GLENWOOD Palma

## 2023-09-14 NOTE — Addendum Note (Signed)
 Addended by: OVERTON FAITH T on: 09/14/2023 01:29 PM   Modules accepted: Orders

## 2023-09-15 ENCOUNTER — Telehealth: Payer: Self-pay

## 2023-09-15 LAB — URINE CYTOLOGY ANCILLARY ONLY
Chlamydia: NEGATIVE
Comment: NEGATIVE
Comment: NEGATIVE
Comment: NORMAL
Neisseria Gonorrhea: NEGATIVE
Trichomonas: NEGATIVE

## 2023-09-15 LAB — CYTOLOGY, (ORAL, ANAL, URETHRAL) ANCILLARY ONLY
Chlamydia: NEGATIVE
Chlamydia: NEGATIVE
Comment: NEGATIVE
Comment: NEGATIVE
Comment: NORMAL
Comment: NORMAL
Neisseria Gonorrhea: NEGATIVE
Neisseria Gonorrhea: NEGATIVE

## 2023-09-15 NOTE — Telephone Encounter (Signed)
 Patient called office stating he tried to fill Macrobid  prescription from [facility]; only received 5 day supply out of 14 day that was ordered.  Will call [facility] for more information regarding partial fill.  Per pharmacy team they did not have enough Macrobid  on hand and were waiting on shipment to come through. Pharmacy now has medication at pharmacy and will work on filling medication . Will have it ready today. No copay. Patient made aware. Lorenda CHRISTELLA Code, RMA

## 2023-09-19 LAB — CBC
HCT: 39.5 % (ref 38.5–50.0)
Hemoglobin: 13.4 g/dL (ref 13.2–17.1)
MCH: 30.9 pg (ref 27.0–33.0)
MCHC: 33.9 g/dL (ref 32.0–36.0)
MCV: 91 fL (ref 80.0–100.0)
MPV: 11.4 fL (ref 7.5–12.5)
Platelets: 137 Thousand/uL — ABNORMAL LOW (ref 140–400)
RBC: 4.34 Million/uL (ref 4.20–5.80)
RDW: 15 % (ref 11.0–15.0)
WBC: 4.3 Thousand/uL (ref 3.8–10.8)

## 2023-09-19 LAB — BASIC METABOLIC PANEL WITH GFR
BUN/Creatinine Ratio: 8 (calc) (ref 6–22)
BUN: 11 mg/dL (ref 7–25)
CO2: 30 mmol/L (ref 20–32)
Calcium: 9.1 mg/dL (ref 8.6–10.3)
Chloride: 104 mmol/L (ref 98–110)
Creat: 1.41 mg/dL — ABNORMAL HIGH (ref 0.60–1.26)
Glucose, Bld: 92 mg/dL (ref 65–99)
Potassium: 3.7 mmol/L (ref 3.5–5.3)
Sodium: 142 mmol/L (ref 135–146)
eGFR: 65 mL/min/1.73m2 (ref >=60)

## 2023-09-19 LAB — SYPHILIS ANTIBODY CASCADING REFLEX: RPR Screen: NONREACTIVE

## 2023-09-19 LAB — C-REACTIVE PROTEIN: CRP: <3 mg/L (ref <8.0)

## 2023-09-19 LAB — T.PALLIDUM AB, TOTAL: T pallidum Antibodies (TP-PA): REACTIVE — AB

## 2023-09-21 ENCOUNTER — Ambulatory Visit (HOSPITAL_COMMUNITY)
Admission: RE | Admit: 2023-09-21 | Discharge: 2023-09-21 | Disposition: A | Discharge status: Home / Self Care | Source: Ambulatory Visit | Attending: Internal Medicine | Admitting: Internal Medicine

## 2023-09-21 ENCOUNTER — Other Ambulatory Visit (HOSPITAL_COMMUNITY)

## 2023-09-21 DIAGNOSIS — R102 Pelvic and perineal pain: Secondary | ICD-10-CM | POA: Diagnosis present

## 2023-09-21 LAB — CYTOLOGY - PAP
Comment: NEGATIVE
Diagnosis: UNDETERMINED — AB
High risk HPV: NEGATIVE

## 2023-09-21 MED ORDER — GADOBUTROL 1 MMOL/ML IV SOLN
8.0000 mL | Freq: Once | INTRAVENOUS | Status: AC | PRN
  Administered 2023-09-21: 8 mL via INTRAVENOUS

## 2023-09-22 ENCOUNTER — Ambulatory Visit: Payer: Self-pay | Admitting: Internal Medicine

## 2023-09-28 NOTE — Addendum Note (Signed)
 Addended byBETHA OVERTON FAITH T on: 09/28/2023 09:24 AM   Modules accepted: Orders

## 2023-09-29 NOTE — Telephone Encounter (Signed)
 Spoke with Ozell, discussed prostatitis diagnosis and need for treatment with IV ertapenem.   Scheduled for IR on 7/18 at 2 PM.   Orders sent to Holley Herring, RN with [facility]. Waiting to hear if first dose can be done in home.   Regan Llorente, BSN, RN

## 2023-09-30 ENCOUNTER — Telehealth (HOSPITAL_COMMUNITY): Payer: Self-pay

## 2023-09-30 NOTE — Telephone Encounter (Signed)
 Unable to do first dose in home. Spoke with short stay and notified IR that patient would need first dose after PICC placement. Orders faxed to short stay. Notified Holley Herring, RN with [employer].   Okay to pull PICC after last dose per Dr. Overton, orders given to [facility].   Duwaine Lowe, BSN, RN

## 2023-09-30 NOTE — Telephone Encounter (Signed)
 Auth Submission: NO AUTH NEEDED Site of care: Site of care: [facility] Payer: [facility] Medicaid Medication & CPT/J Code(s) submitted: ertapenem J1335 Diagnosis Code: N41.1 Route of submission (phone, fax, portal): portal Phone # Fax # Auth type: Buy/Bill HB Units/visits requested: 1000mg  x 1 dose Reference number:  Approval from: 09/30/23 to 03/15/24

## 2023-10-01 ENCOUNTER — Ambulatory Visit (HOSPITAL_COMMUNITY)
Admission: RE | Admit: 2023-10-01 | Discharge: 2023-10-01 | Disposition: A | Discharge status: Home / Self Care | Source: Ambulatory Visit | Attending: Internal Medicine | Admitting: Internal Medicine

## 2023-10-01 ENCOUNTER — Encounter (HOSPITAL_COMMUNITY)
Admission: RE | Admit: 2023-10-01 | Discharge: 2023-10-01 | Disposition: A | Discharge status: Home / Self Care | Source: Ambulatory Visit | Attending: Internal Medicine | Admitting: Internal Medicine

## 2023-10-01 ENCOUNTER — Other Ambulatory Visit: Payer: Self-pay | Admitting: Internal Medicine

## 2023-10-01 DIAGNOSIS — B20 Human immunodeficiency virus [HIV] disease: Secondary | ICD-10-CM

## 2023-10-01 DIAGNOSIS — N411 Chronic prostatitis: Secondary | ICD-10-CM | POA: Diagnosis present

## 2023-10-01 DIAGNOSIS — Z7252 High risk homosexual behavior: Secondary | ICD-10-CM

## 2023-10-01 DIAGNOSIS — Z113 Encounter for screening for infections with a predominantly sexual mode of transmission: Secondary | ICD-10-CM

## 2023-10-01 DIAGNOSIS — R102 Pelvic and perineal pain: Secondary | ICD-10-CM

## 2023-10-01 DIAGNOSIS — Z129 Encounter for screening for malignant neoplasm, site unspecified: Secondary | ICD-10-CM

## 2023-10-01 MED ORDER — HEPARIN SOD (PORK) LOCK FLUSH 100 UNIT/ML IV SOLN
INTRAVENOUS | Status: AC
  Filled 2023-10-01: qty 5

## 2023-10-01 MED ORDER — SODIUM CHLORIDE 0.9 % IV SOLN
1.0000 g | Freq: Once | INTRAVENOUS | Status: AC
  Administered 2023-10-01: 1 g via INTRAVENOUS
  Filled 2023-10-01: qty 1000

## 2023-10-01 MED ORDER — HEPARIN SOD (PORK) LOCK FLUSH 100 UNIT/ML IV SOLN
250.0000 [IU] | INTRAVENOUS | Status: AC | PRN
  Administered 2023-10-01: 250 [IU]

## 2023-10-01 MED ORDER — LIDOCAINE HCL 1 % IJ SOLN
INTRAMUSCULAR | Status: AC
Start: 2023-10-01 — End: 2023-10-01
  Filled 2023-10-01: qty 20

## 2023-10-01 NOTE — Procedures (Signed)
 PROCEDURE SUMMARY:  Successful placement of single lumen PICC line to right basilic vein. Length 35cm Tip at lower SVC/RA No complications PICC capped Ready for use. EBL = < 3 mL  Please see full dictation in Imaging section for details.   Rika Daughdrill NP 10/01/2023 2:26 PM

## 2023-10-05 ENCOUNTER — Other Ambulatory Visit: Payer: Self-pay

## 2023-10-05 ENCOUNTER — Other Ambulatory Visit (HOSPITAL_COMMUNITY): Payer: Self-pay

## 2023-10-05 ENCOUNTER — Encounter: Payer: Self-pay | Admitting: Internal Medicine

## 2023-10-05 ENCOUNTER — Ambulatory Visit (INDEPENDENT_AMBULATORY_CARE_PROVIDER_SITE_OTHER): Admitting: Internal Medicine

## 2023-10-05 ENCOUNTER — Telehealth: Payer: Self-pay

## 2023-10-05 VITALS — BP 101/68 | HR 62 | Temp 98.1°F | Ht 73.0 in | Wt 197.0 lb

## 2023-10-05 DIAGNOSIS — B20 Human immunodeficiency virus [HIV] disease: Secondary | ICD-10-CM | POA: Diagnosis not present

## 2023-10-05 DIAGNOSIS — N411 Chronic prostatitis: Secondary | ICD-10-CM

## 2023-10-05 NOTE — Progress Notes (Signed)
 Subjective:    Patient ID: Frederick Hess, adult    DOB: [birth year], 38 y.o.   MRN: [id number]  Chief Complaint  Patient presents with   Follow-up     HPI:  Frederick Hess is a 38 y.o. adult living with HIV. He is adherent to Biktarvy  and tolerating well. He takes every morning without any missed doses.  He does not take any other OTC, supplements or vitamins. He is currently practicing celibacy. Treated for chlamydia 02/25/23. He has not been sexually active since that time, however, he wants to be tested for STIs today and start fresh and clean. He completed course of doxycycline  in December and has been asymptomatic since that time.  He declines all vaccinations today. Diagnosed with flu a few weeks ago, full recovery.   He is working currently at [employer]   09/13/23 id clinic visit See a&p Last sexual intercourse 2 days ago unprotected   10/05/23 id clinic visit Less perineal pain 4 days into ertapenem  for prostatitis Wants to do cabenuva On biktarvy  no missed dose last 4 weeks Reviewed recent std labs    Allergies  Allergen Reactions   Amoxicillin  Anaphylaxis and Hives    Has patient had a PCN reaction causing immediate rash, facial/tongue/throat swelling, SOB or lightheadedness with hypotension: Yes Has patient had a PCN reaction causing severe rash involving mucus membranes or skin necrosis: Yes Has patient had a PCN reaction that required hospitalization: ED visit Has patient had a PCN reaction occurring within the last 10 years: Yes, in 2015 If all of the above answers are NO, then may proceed with Cephalosporin use.       Outpatient Medications Prior to Visit  Medication Sig Dispense Refill   bictegravir-emtricitabine-tenofovir AF (BIKTARVY ) 50-200-25 MG TABS tablet Take 1 tablet by mouth daily. 30 tablet 11   ibuprofen  (ADVIL ) 800 MG tablet Take 1 tablet (800 mg total) by mouth 3 (three) times daily. 21 tablet 0    Azelastine  HCl 137 MCG/SPRAY SOLN Place 1 spray into the nose 2 (two) times daily as needed. (Patient not taking: Reported on 10/05/2023) 30 mL 1   benzonatate  (TESSALON ) 200 MG capsule Take 1 capsule (200 mg total) by mouth 3 (three) times daily as needed for cough. (Patient not taking: Reported on 10/05/2023) 20 capsule 0   doxycycline  (VIBRAMYCIN ) 100 MG capsule Take 1 capsule (100 mg total) by mouth 2 (two) times daily. (Patient not taking: Reported on 10/05/2023) 20 capsule 0   ondansetron  (ZOFRAN -ODT) 4 MG disintegrating tablet Take 1 tablet (4 mg total) by mouth every 8 (eight) hours as needed for nausea or vomiting. (Patient not taking: Reported on 10/05/2023) 20 tablet 0   No facility-administered medications prior to visit.     Past Medical History:  Diagnosis Date   Hormone replacement therapy    Migraine    Transexualism      Past Surgical History:  Procedure Laterality Date   COSMETIC SURGERY     rhinoplasty   RHINOPLASTY        ROS: All other ros negative    Objective:    BP 101/68   Pulse 62   Temp 98.1 F (36.7 C) (Oral)   Ht 6' 1 (1.854 m)   Wt 197 lb (89.4 kg)   SpO2 98%   BMI 25.99 kg/m  Nursing note and vital signs reviewed.  Physical Exam Vitals reviewed.  Constitutional:      Appearance: Normal appearance. He is normal weight.  HENT:     Head: Normocephalic and atraumatic.     Mouth/Throat:     Mouth: Mucous membranes are moist.     Pharynx: Oropharynx is clear. No oropharyngeal exudate or posterior oropharyngeal erythema.  Cardiovascular:     Rate and Rhythm: Normal rate and regular rhythm.  Pulmonary:     Effort: Pulmonary effort is normal.     Breath sounds: Normal breath sounds.  Musculoskeletal:        General: Normal range of motion.     Cervical back: Normal range of motion.  Skin:    General: Skin is warm and dry.     Findings: No lesion or rash.  Neurological:     General: No focal deficit present.     Mental Status: He is  alert and oriented to person, place, and time.  Psychiatric:        Mood and Affect: Mood normal.        Behavior: Behavior normal.        Thought Content: Thought content normal.        Judgment: Judgment normal.         10/05/2023    8:46 AM 09/13/2023    3:55 PM 05/11/2023    1:32 PM 02/25/2023    1:53 PM 12/29/2022    8:42 AM  Depression screen PHQ 2/9  Decreased Interest 0 0 0 0 0  Down, Depressed, Hopeless 0 0 0 0 0  PHQ - 2 Score 0 0 0 0 0  Altered sleeping 0 0     Tired, decreased energy 0 0     Change in appetite 0 0     Feeling bad or failure about yourself  0 0     Trouble concentrating 0 0     Moving slowly or fidgety/restless 0 0     Suicidal thoughts 0 0     PHQ-9 Score 0 0          Assessment & Plan:   Problem List Items Addressed This Visit   None      #hiv Dx'ed 08/2022; hx transgender male switched back to male since 2023; msm Cd4 nadir 197 (16%) @ 09/2022 Therapy: 09/2022-c biktarvy    09/13/23 compliant with biktarvy  no missed dose last 4 weeks. Patient interested in cabenuva as he doesn't want to forget taking pills.   He has prior acute hep b where it's in remission/functional cure at this time. Ok to be on guinea   -discussed u=u -encourage compliance -continue current HIV medication -labs std testing today; hiv well controlled and can repeat next visit in 6 months -f/u in 6 months -refer to pharmacy for cabenuva eligibility and initiation of he is  ----------- 10/05/23  Genosure testing today in prep for cabenuva     #chronic prostatitits/perineal pain #high risk sexual behavior #hx chlamydia 02/2023 #recent pelvic pain/uti-prostatitis sx No improvement of uti sx outside of hematuria despite macrobid  and recently doxy (doxy finished 4-5 days ago) Perineal pain several weeks --> Mri 09/2023 consistent with prostatitis   -10/05/23 pain still intermittent 3-4 times a week previously but now only felt once; he is 4 days into his  ertepenem (started 7/19- planned 3 weeks) -negative triple screen and rpr  -patient to send me a message in a week regarding symptoms -follow up in 4 weeks    #social -working: Glass blower/designer -no smoking, drinking, or street drugs -not in relationship (single for 2.5 years); sexually active with male   #  hx gu infection see above Bloody urine, lower abdominal pain, lower back pain, and dysuria since late may 2025 seen ed wl a few times and just finished antibiotics 4 days prior to this visit 09/13/23 6/17 ct with contrast normal prostate  He had received 5 days of macrobid  on 6/6 and then doxycycline . He still have sx outside of the bloody urine  No testicular pain  Hx not consistent with NGU   -mri pelvis  -macrobid  for 14 days -if mri positive will need iv abx   #hcm -vaccination 2024 tdap Doesn't want any vaccine -hepatitis 09/2022 functional cure hep b by serology -std See above -tb 09/2022 quantiferon gold negative -cancer screening Anal cancer screening today 09/13/23   Follow-up: Return in about 4 weeks (around 11/02/2023).

## 2023-10-05 NOTE — Telephone Encounter (Signed)
 Pharmacy Patient Advocate Encounter  Insurance verification completed.   The patient is insured through [facility] MEDICAID   Ran test claim for Cabenuva. Currently a quantity of 6ml is a 30 day supply and the co-pay is $0.00 .  Script can be filled at [facility]   This test claim was processed through [facility]- copay amounts may vary at other pharmacies due to pharmacy/plan contracts, or as the patient moves through the different stages of their insurance plan.

## 2023-10-05 NOTE — Patient Instructions (Signed)
 I'll forward your chart to pharmacy team for the injection   Send me a mychart message in a week to report your prostate/rectal pain symptoms (better, same, gone) so I know if need to extend antibiotics or not   Quick lab today to prepare for cabenuva eligibility

## 2023-10-05 NOTE — Progress Notes (Signed)
 Labs drawn via PICC line per Dr. Overton. Line flushed with 10 mL normal saline and clamped. Patient tolerated procedure well.   Valicia Rief, BSN, RN

## 2023-10-16 ENCOUNTER — Ambulatory Visit: Admission: EM | Admit: 2023-10-16 | Discharge: 2023-10-16 | Disposition: A | Discharge status: Home / Self Care

## 2023-10-16 ENCOUNTER — Encounter: Payer: Self-pay | Admitting: Emergency Medicine

## 2023-10-16 DIAGNOSIS — N411 Chronic prostatitis: Secondary | ICD-10-CM

## 2023-10-16 DIAGNOSIS — K6289 Other specified diseases of anus and rectum: Secondary | ICD-10-CM

## 2023-10-16 NOTE — Discharge Instructions (Addendum)
 I am concerned because the severe intermittent pains to the rectal area have not been improving despite IV antibiotic use for chronic prostatitis.  Please head to the nearest emergency department for further evaluation and advanced imaging.

## 2023-10-16 NOTE — ED Triage Notes (Signed)
 PT c/o rectal pain for about 1 month. He has been seen at PCP for this and placed on IV abx due to dx of prostatitis.   He taken anything for pain today. Describes pain as sharp and shooting 10/10 that comes and goes.

## 2023-10-16 NOTE — ED Provider Notes (Signed)
 [facility]    CSN: [id number] Arrival date & time: 10/16/23  1008      History   Chief Complaint Chief Complaint  Patient presents with   Rectal Pain    HPI Frederick Hess is a 38 y.o. adult.   Patient presents to clinic for concerns of intermittent 10/10 rectal pain that has been ongoing without improvement.  Pain will be severe, sharp and stabbing in the rectal area, comes and goes.  Episode happened once this morning and a handful of times yesterday.  Has not had any fevers.  Patient is currently on IV antibiotics through a PICC line for chronic prostatitis that was found on an MRI earlier in July.  Has had multiple visits for complicated UTIs.   History of HIV, well-controlled with medication.  The history is provided by the patient and medical records.    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      Home Medications    Prior to Admission medications   Medication Sig Start Date End Date Taking? Authorizing Provider  Azelastine  HCl 137 MCG/SPRAY SOLN Place 1 spray into the nose 2 (two) times daily as  needed. Patient not taking: Reported on 10/05/2023 06/03/23   Reddick, Johnathan B, NP  benzonatate  (TESSALON ) 200 MG capsule Take 1 capsule (200 mg total) by mouth 3 (three) times daily as needed for cough. Patient not taking: Reported on 10/05/2023 06/03/23   Reddick, Johnathan B, NP  bictegravir-emtricitabine-tenofovir AF (BIKTARVY ) 50-200-25 MG TABS tablet Take 1 tablet by mouth daily. 09/13/23   Vu, Constance T, MD  doxycycline  (VIBRAMYCIN ) 100 MG capsule Take 1 capsule (100 mg total) by mouth 2 (two) times daily. Patient not taking: Reported on 10/05/2023 08/30/23   Christopher Savannah, PA-C  ibuprofen  (ADVIL ) 800 MG tablet Take 1 tablet (800 mg total) by mouth 3 (three) times daily. 06/03/23   Reddick, Johnathan B, NP  ondansetron  (ZOFRAN -ODT) 4 MG disintegrating tablet Take 1 tablet (4 mg total) by mouth every 8 (eight) hours as needed for nausea or vomiting. Patient not taking: Reported on 10/05/2023 08/07/23   Barrett, Warren SAILOR, PA-C    Family History Family History  Problem Relation Age of Onset   Diabetes Mother     Social History Social History   Tobacco Use   Smoking status: Former    Current packs/day: 1.00    Types: Cigarettes   Smokeless tobacco: Never  Vaping Use   Vaping status: Never Used  Substance Use Topics   Alcohol use: Never   Drug use: Never     Allergies  Amoxicillin    Review of Systems Review of Systems  Per HPI  Physical Exam Triage Vital Signs ED Triage Vitals  Encounter Vitals Group     BP 10/16/23 1035 132/77     Girls Systolic BP Percentile --      Girls Diastolic BP Percentile --      Boys Systolic BP Percentile --      Boys Diastolic BP Percentile --      Pulse Rate 10/16/23 1035 75     Resp 10/16/23 1035 16     Temp 10/16/23 1035 98 F (36.7 C)     Temp Source 10/16/23 1035 Oral     SpO2 10/16/23 1035 96 %     Weight --      Height --      Head Circumference --      Peak Flow --      Pain Score 10/16/23 1026 10     Pain Loc --      Pain  Education --      Exclude from Growth Chart --    No data found.  Updated Vital Signs BP 132/77 (BP Location: Left Arm)   Pulse 75   Temp 98 F (36.7 C) (Oral)   Resp 16   SpO2 96%   Visual Acuity Right Eye Distance:   Left Eye Distance:   Bilateral Distance:    Right Eye Near:   Left Eye Near:    Bilateral Near:     Physical Exam Vitals and nursing note reviewed.  Constitutional:      Appearance: Normal appearance.  HENT:     Head: Normocephalic and atraumatic.     Right Ear: External ear normal.     Left Ear: External ear normal.     Nose: Nose normal.     Mouth/Throat:     Mouth: Mucous membranes are moist.  Cardiovascular:     Rate and Rhythm: Normal rate.  Pulmonary:     Effort: Pulmonary effort is normal. No respiratory distress.  Neurological:     General: No focal deficit present.     Mental Status: He is alert.  Psychiatric:        Mood and Affect: Mood normal.      UC Treatments / Results  Labs (all labs ordered are listed, but only abnormal results are displayed) Labs Reviewed - No data to display  EKG   Radiology No results found.  Procedures Procedures (including critical care time)  Medications Ordered in UC Medications - No data to display  Initial Impression / Assessment and Plan / UC Course  I have reviewed the triage vital signs and the nursing notes.  Pertinent labs & imaging results that were available during my care of the patient were reviewed by me and considered in my medical decision making (see chart for details).  Vitals and triage reviewed, patient is hemodynamically stable.  Afebrile and without tachycardia.  Concern for continued severe stabbing pains rated 10/10.  Discussed it is inappropriate for urgent care to perform pain management, and expressed concern over no improvement with his pain despite IV antibiotics for prostatitis.  Discussed case with Dr. Vonna, who is in agreements with sending patient to the  nearest emergency department for further advanced imaging to rule out abscess or further progression of prostatitis versus other acute pathology.  Patient agreeable to plan, will seek further care.  Plan of care, follow-up care return precautions given, no questions at this time.  Final Clinical Impressions(s) / UC Diagnoses   Final diagnoses:  Chronic prostatitis  Rectal pain     Discharge Instructions      I am concerned because the severe intermittent pains to the rectal area have not been improving despite IV antibiotic use for chronic prostatitis.  Please head to the nearest emergency department for further evaluation and advanced imaging.    ED Prescriptions   None    PDMP not reviewed this encounter.   Dreama, Olive Zmuda  N, FNP 10/16/23 1101

## 2023-11-04 ENCOUNTER — Other Ambulatory Visit (HOSPITAL_COMMUNITY)
Admission: RE | Admit: 2023-11-04 | Discharge: 2023-11-04 | Disposition: A | Discharge status: Home / Self Care | Source: Ambulatory Visit | Attending: Infectious Diseases | Admitting: Infectious Diseases

## 2023-11-04 ENCOUNTER — Telehealth: Payer: Self-pay

## 2023-11-04 ENCOUNTER — Encounter: Payer: Self-pay | Admitting: Infectious Diseases

## 2023-11-04 ENCOUNTER — Other Ambulatory Visit: Payer: Self-pay

## 2023-11-04 ENCOUNTER — Ambulatory Visit: Admitting: Infectious Diseases

## 2023-11-04 VITALS — BP 120/78 | HR 67 | Temp 98.5°F | Wt 197.0 lb

## 2023-11-04 DIAGNOSIS — Z113 Encounter for screening for infections with a predominantly sexual mode of transmission: Secondary | ICD-10-CM | POA: Diagnosis present

## 2023-11-04 DIAGNOSIS — N411 Chronic prostatitis: Secondary | ICD-10-CM | POA: Diagnosis not present

## 2023-11-04 DIAGNOSIS — Z452 Encounter for adjustment and management of vascular access device: Secondary | ICD-10-CM

## 2023-11-04 DIAGNOSIS — B20 Human immunodeficiency virus [HIV] disease: Secondary | ICD-10-CM | POA: Diagnosis present

## 2023-11-04 DIAGNOSIS — Z79899 Other long term (current) drug therapy: Secondary | ICD-10-CM | POA: Diagnosis not present

## 2023-11-04 DIAGNOSIS — N419 Inflammatory disease of prostate, unspecified: Secondary | ICD-10-CM

## 2023-11-04 NOTE — Progress Notes (Unsigned)
 [address], [place], [place], [address]                                                                  Phn. [phone]; Fax: [phone]                                                                             Date: 11/04/23  Reason for Visit: fu on chronic prostatitis/HIV   HPI: Frederick Hess is a 38 y.o.old adult with a history of prior Trans gender M to F, back to Male since 2023, [place] s/p tx, HIV who is here for follow up in the setting of being treated for chronic prostatitis.   Interval hx/current visit: Completed IV antibiotics approx 2 weeks ago, 3 weeks total. PICC removed. Prior GU symptoms have significantly improved except some occasional pain on and off in the suprapibic area and rectal area. Denies perineal pain, dysuria, increased frequency of urination, penile discharge, ulcers, rectal pain.  Compliant with Biktarvy  with no missed doses or concerns. Wans to be screened for STDs. Sexually active with male partner. Denies  smoking, alcohol and recreational drugs.   No other complaints.    ROS: As stated in above HPI; all other systems were reviewed and are otherwise negative unless noted below  No reported fever / chills, night sweats, unintentional weight loss, acute visual change, odynophagia, chest pain/pressure, new or worsened SOB or WOB, nausea, vomiting, diarrhea, dysuria, GU discharge, syncope, seizures, red/hot swollen joints, hallucinations / delusions, rashes, new allergies, unusual / excessive bleeding, swollen lymph nodes, or new hospitalizations/ED visits/Urgent Care visits since the pt was last seen.  PMH/ PSH/ FamHx / Social Hx , medications and allergies reviewed and updated as appropriate; please see corresponding tab in EHR / prior notes                                         MEDS  Allergies  Allergen Reactions   Amoxicillin  Anaphylaxis and Hives    Has patient had a PCN reaction causing immediate rash, facial/tongue/throat swelling, SOB or lightheadedness with hypotension: Yes Has patient had a PCN reaction causing severe rash involving mucus membranes or skin necrosis: Yes Has patient had a PCN reaction that required hospitalization: ED visit Has patient had a PCN reaction occurring within the last 10 years: Yes, in 2015 If all of the above answers are NO, then may proceed with Cephalosporin use.    Past Medical History:  Diagnosis Date   Hormone replacement therapy  Migraine    Transexualism    Past Surgical History:  Procedure Laterality Date   COSMETIC SURGERY     rhinoplasty   RHINOPLASTY    ' Social History   Socioeconomic History   Marital status: Single    Spouse name: Not on file   Number of children: Not on file   Years of education: Not on file   Highest education level: Not on file  Occupational History   Not on file  Tobacco Use   Smoking status: Former    Current packs/day: 1.00    Types: Cigarettes   Smokeless tobacco: Never  Vaping Use   Vaping status: Never Used  Substance and Sexual Activity   Alcohol use: Never   Drug use: Never   Sexual activity: Not Currently    Comment: condoms given  Other Topics Concern   Not on file  Social History Narrative   Not on file   Social Drivers of Health   Financial Resource Strain: Not on file  Food Insecurity: Not on file  Transportation Needs: Not on file  Physical Activity: Not on file  Stress: Not on file (01/21/2023)  Social Connections: Not on file  Intimate Partner Violence: Not on file   Family History  Problem Relation Age of Onset   Diabetes Mother    Vitals  BP 120/78   Pulse 67   Temp 98.5 F (36.9 C) (Oral)   Wt 197 lb (89.4 kg)   SpO2 96%   BMI 25.99 kg/m   Examination  Gen: no acute distress HEENT: [place]/AT, no  scleral icterus, no pale conjunctivae, hearing normal, oral mucosa moist Neck: Supple Cardio: Regular rate and rhythm Resp: Pulmonary effort normal in room air GI: nondistended GU: deferred Musc: Extremities: No pedal edema Skin: No rashes Neuro: grossly non focal , awake, alert and oriented * 3  Psych: Calm, cooperative  Lab Results HIV 1 RNA Quant  Date Value  05/11/2023 <20 Copies/mL (H)  02/25/2023 74 copies/mL (H)  12/29/2022 122 copies/mL (H)   CD4 T Cell Abs (/uL)  Date Value  05/11/2023 290 (L)  02/25/2023 351 (L)  09/24/2022 197 (L)   No results found for: HIV1GENOSEQ Lab Results  Component Value Date   WBC 4.3 09/13/2023   HGB 13.4 09/13/2023   HCT 39.5 09/13/2023   MCV 91.0 09/13/2023   PLT 137 (L) 09/13/2023    Lab Results  Component Value Date   CREATININE 1.41 (H) 09/13/2023   BUN 11 09/13/2023   NA 142 09/13/2023   K 3.7 09/13/2023   CL 104 09/13/2023   CO2 30 09/13/2023   Lab Results  Component Value Date   ALT 12 08/31/2023   AST 15 08/31/2023   ALKPHOS 29 (L) 08/31/2023   BILITOT 1.2 08/31/2023    No results found for: CHOL, TRIG, HDL, LDLCALC Lab Results  Component Value Date   HAV NON-REACTIVE 09/24/2022   Lab Results  Component Value Date   HEPBSAG NON-REACTIVE 09/24/2022   HEPBSAB REACTIVE (A) 09/24/2022   No results found for: HCVAB Lab Results  Component Value Date   CHLAMYDIAWP Negative 09/13/2023   CHLAMYDIAWP Negative 09/13/2023   CHLAMYDIAWP Negative 09/13/2023   N Negative 09/13/2023   N Negative 09/13/2023   N Negative 09/13/2023   No results found for: GCPROBEAPT No results found for: QUANTGOLD    Health Maintenance: Immunization History  Administered Date(s) Administered   DTaP 04/21/1984, 06/23/1984, 09/20/1984, 09/18/1985   Hepatitis B, PED/ADOLESCENT 12/25/1996, 03/28/1997, 06/28/1997  Influenza-Unspecified 02/22/2008   MMR 05/19/1985   OPV 04/21/1984, 06/23/1984, 09/20/1984   Tdap  09/11/2022   Assessment/Plan: # Chronic prostatitis  - s/p completion of 3 weeks of IV ertapenem   - significant improvement in prior symptoms - fu in 6 weeks   # HIV  - continue PO biktarvy  - labs today  - fu as above, potential plan for cabenuva noted   # STD Screening  - GC triple screen and RPR  # Immunization  # Health maintenance - to be discussed in subsequent visits with primary HIV provider  Patient's labs were reviewed as well as his previous records. Patients questions were addressed and answered. Safe sex counseling done.  I spent 35  minutes involved in face-to-face and non-face-to-face activities for this patient on the day of the visit. Professional time spent includes the following activities: Preparing to see the patient (review of tests), Obtaining and/or reviewing separately obtained history (admission/discharge record), Performing a medically appropriate examination and/or evaluation , Ordering medications/tests/procedures, referring and communicating with other health care professionals, Documenting clinical information in the EMR, Independently interpreting results (not separately reported), Communicating results to the patient/family/caregiver, Counseling and educating the patient/family/caregiver and Care coordination (not separately reported).   Of note, portions of this note may have been created with voice recognition software. While this note has been edited for accuracy, occasional wrong-word or 'sound-a-like' substitutions may have occurred due to the inherent limitations of voice recognition software.   Electronically signed by:  Annalee Orem, MD Infectious Disease Physician [facility] [address] [place], [place] [address] Phone: [phone]  Fax: [phone]

## 2023-11-04 NOTE — Telephone Encounter (Signed)
 Analyzed patient's GenoSure Archive from 10/07/23. No resistance based on this report. Patient previously expressed interest in Cabenuva, but would like to defer initiation at this time. Based on these sensitivities, he is a good candidate if he did choose to switch.

## 2023-11-05 LAB — URINE CYTOLOGY ANCILLARY ONLY
Chlamydia: NEGATIVE
Comment: NEGATIVE
Comment: NORMAL
Neisseria Gonorrhea: NEGATIVE

## 2023-11-05 LAB — CYTOLOGY, (ORAL, ANAL, URETHRAL) ANCILLARY ONLY
Chlamydia: NEGATIVE
Chlamydia: NEGATIVE
Comment: NEGATIVE
Comment: NEGATIVE
Comment: NORMAL
Comment: NORMAL
Neisseria Gonorrhea: NEGATIVE
Neisseria Gonorrhea: NEGATIVE

## 2023-11-05 LAB — T-HELPER CELLS (CD4) COUNT (NOT AT ARMC)
CD4 % Helper T Cell: 28 % — ABNORMAL LOW (ref 33–65)
CD4 T Cell Abs: 306 /uL — ABNORMAL LOW (ref 400–1790)

## 2023-11-08 DIAGNOSIS — N419 Inflammatory disease of prostate, unspecified: Secondary | ICD-10-CM | POA: Insufficient documentation

## 2023-11-08 DIAGNOSIS — Z452 Encounter for adjustment and management of vascular access device: Secondary | ICD-10-CM | POA: Insufficient documentation

## 2023-11-08 LAB — COMPREHENSIVE METABOLIC PANEL WITH GFR
AG Ratio: 1.5 (calc) (ref 1.0–2.5)
ALT: 22 U/L (ref 9–46)
AST: 18 U/L (ref 10–40)
Albumin: 4.6 g/dL (ref 3.6–5.1)
Alkaline phosphatase (APISO): 32 U/L — ABNORMAL LOW (ref 36–130)
BUN/Creatinine Ratio: 9 (calc) (ref 6–22)
BUN: 12 mg/dL (ref 7–25)
CO2: 28 mmol/L (ref 20–32)
Calcium: 9.5 mg/dL (ref 8.6–10.3)
Chloride: 104 mmol/L (ref 98–110)
Creat: 1.38 mg/dL — ABNORMAL HIGH (ref 0.60–1.26)
Globulin: 3.1 g/dL (ref 1.9–3.7)
Glucose, Bld: 89 mg/dL (ref 65–99)
Potassium: 4.2 mmol/L (ref 3.5–5.3)
Sodium: 140 mmol/L (ref 135–146)
Total Bilirubin: 1.5 mg/dL — ABNORMAL HIGH (ref 0.2–1.2)
Total Protein: 7.7 g/dL (ref 6.1–8.1)
eGFR: 67 mL/min/1.73m2 (ref >=60)

## 2023-11-08 LAB — HIV RNA, RTPCR W/R GT (RTI, PI,INT)
HIV 1 RNA Quant: 50 {copies}/mL — ABNORMAL HIGH
HIV-1 RNA Quant, Log: 1.7 {Log_copies}/mL — ABNORMAL HIGH

## 2023-11-08 LAB — RPR: RPR Ser Ql: NONREACTIVE

## 2023-11-09 ENCOUNTER — Ambulatory Visit: Payer: Self-pay | Admitting: Infectious Diseases

## 2023-11-11 ENCOUNTER — Ambulatory Visit: Admitting: Infectious Diseases

## 2023-11-12 ENCOUNTER — Telehealth: Payer: Self-pay | Admitting: Infectious Disease

## 2023-11-12 NOTE — Telephone Encounter (Addendum)
 Patient had operator page me but did not pick up when I called X2 , Left VM  Will call again in 30 minutes  I called pt back again.  He was concerned about a mychart message about his kidney fxn.  His kidney fxn is actually among the best it has been in 2025  The minimal elevation of bilirubin is likely Gilbert's and not something to worry about

## 2023-12-02 ENCOUNTER — Ambulatory Visit: Admitting: Dermatology

## 2023-12-11 ENCOUNTER — Encounter (HOSPITAL_COMMUNITY): Payer: Self-pay

## 2023-12-11 ENCOUNTER — Ambulatory Visit (HOSPITAL_COMMUNITY): Admission: EM | Admit: 2023-12-11 | Discharge: 2023-12-11 | Disposition: A | Discharge status: Home / Self Care

## 2023-12-11 DIAGNOSIS — R300 Dysuria: Secondary | ICD-10-CM | POA: Insufficient documentation

## 2023-12-11 LAB — POCT URINALYSIS DIP (MANUAL ENTRY)
Bilirubin, UA: NEGATIVE
Blood, UA: NEGATIVE
Glucose, UA: NEGATIVE mg/dL
Ketones, POC UA: NEGATIVE mg/dL
Nitrite, UA: NEGATIVE
Protein Ur, POC: NEGATIVE mg/dL
Spec Grav, UA: 1.015 (ref 1.010–1.025)
Urobilinogen, UA: 0.2 U/dL
pH, UA: 5.5 (ref 5.0–8.0)

## 2023-12-11 MED ORDER — PHENAZOPYRIDINE HCL 200 MG PO TABS: 200.0000 mg | ORAL_TABLET | Freq: Three times a day (TID) | ORAL | 0 refills | Status: AC

## 2023-12-11 MED ORDER — NITROFURANTOIN MONOHYD MACRO 100 MG PO CAPS: 100.0000 mg | ORAL_CAPSULE | Freq: Two times a day (BID) | ORAL | 0 refills | Status: DC

## 2023-12-11 NOTE — ED Provider Notes (Signed)
 [facility]  Note:  This document was prepared using Conservation officer, historic buildings and may include unintentional dictation errors.  MRN: [id number] DOB: Mar 10, 1986  Subjective:   Frederick Hess is a 38 y.o. adult presenting for suprapubic abdominal pressure, burning with urination x 1 week.  Patient reports any severe abdominal pain, flank pain, penile discharge, penile lesions, increased urinary frequency nausea/vomiting.  Patient denies any new foods or medications.  No known sick contacts.  Patient does admit that he was recently admitted for IV antibiotics due to possible sepsis.  Patient has history of HIV which may contribute to increased risk for secondary infection.  No current facility-administered medications for this encounter.  Current Outpatient Medications:    bictegravir-emtricitabine-tenofovir AF (BIKTARVY ) 50-200-25 MG TABS tablet, Take 1 tablet by mouth daily., Disp: 30 tablet, Rfl: 11   Azelastine  HCl 137 MCG/SPRAY SOLN, Place 1 spray into the nose 2 (two) times daily as needed. (Patient not taking: Reported on 10/05/2023), Disp: 30 mL, Rfl: 1   benzonatate  (TESSALON ) 200 MG capsule, Take 1 capsule (200 mg total) by mouth 3 (three) times daily as needed for cough. (Patient not taking: Reported on 10/05/2023), Disp: 20 capsule, Rfl: 0   doxycycline  (VIBRAMYCIN ) 100 MG capsule, Take 1 capsule (100 mg total) by mouth 2 (two) times daily. (Patient not taking: Reported on 10/05/2023), Disp: 20 capsule, Rfl: 0   ibuprofen  (ADVIL ) 800 MG tablet, Take 1 tablet (800 mg total) by mouth 3 (three) times daily., Disp: 21 tablet, Rfl: 0   ondansetron  (ZOFRAN -ODT) 4 MG disintegrating tablet, Take 1 tablet (4 mg total) by mouth every 8 (eight) hours as needed for nausea or vomiting. (Patient not taking: Reported on 10/05/2023), Disp: 20 tablet, Rfl: 0   Allergies  Allergen Reactions   Amoxicillin  Anaphylaxis and Hives    Has patient had a PCN reaction causing immediate  rash, facial/tongue/throat swelling, SOB or lightheadedness with hypotension: Yes Has patient had a PCN reaction causing severe rash involving mucus membranes or skin necrosis: Yes Has patient had a PCN reaction that required hospitalization: ED visit Has patient had a PCN reaction occurring within the last 10 years: Yes, in 2015 If all of the above answers are NO, then may proceed with Cephalosporin use.     Past Medical History:  Diagnosis Date   Hormone replacement therapy    Migraine    Transexualism      Past Surgical History:  Procedure Laterality Date   COSMETIC SURGERY     rhinoplasty   RHINOPLASTY      Family History  Problem Relation Age of Onset   Diabetes Mother     Social History   Tobacco Use   Smoking status: Former    Current packs/day: 1.00    Types: Cigarettes   Smokeless tobacco: Never  Vaping Use   Vaping status: Never Used  Substance Use Topics   Alcohol use: Never   Drug use: Never    ROS Refer to HPI for ROS details.  Objective:    Vitals: There were no vitals taken for this visit.  Physical Exam Vitals and nursing note reviewed.  Constitutional:      General: He is not in acute distress.    Appearance: Normal appearance. He is well-developed. He is not ill-appearing or toxic-appearing.  HENT:     Head: Normocephalic.  Cardiovascular:     Rate and Rhythm: Normal rate.  Pulmonary:     Effort: Pulmonary effort is normal. No respiratory distress.  Abdominal:     General: There is no distension.     Palpations: Abdomen is soft.     Tenderness: There is abdominal tenderness in the suprapubic area. There is no right CVA tenderness or left CVA tenderness.  Skin:    General: Skin is warm and dry.  Neurological:     General: No focal deficit present.     Mental Status: He is alert and oriented to person, place, and time.  Psychiatric:        Mood and Affect: Mood normal.        Behavior: Behavior normal.     Procedures  No  results found for this or any previous visit (from the past 24 hours).  Assessment and Plan :   Discharge Instructions   None     Frederick Hess, [place] 12/11/23 1121

## 2023-12-11 NOTE — Discharge Instructions (Signed)
  1. Dysuria (Primary) - POC urinalysis dipstick completed in UC shows trace leukocytes, no nitrite, no blood, these findings are not strongly indicative of urinary tract infection.  Urine culture sent to lab for further testing. - Cytology swab-Urethral; GC / Chlamydia, Trichomonas collected in UC and sent to lab testing results should be available in 2 to 3 days. - phenazopyridine (PYRIDIUM) 200 MG tablet; Take 1 tablet (200 mg total) by mouth 3 (three) times daily.  Dispense: 6 tablet; Refill: 0 - Urine Culture collected in UC and sent to lab for further testing results should be available in 2 to 3 days. - nitrofurantoin , macrocrystal-monohydrate, (MACROBID ) 100 MG capsule; Take 1 capsule (100 mg total) by mouth 2 (two) times daily.  Dispense: 10 capsule; Refill: 0 -Continue to monitor symptoms for any change in severity if there is any escalation of current symptoms or development of new symptoms follow-up in ER for further evaluation and management.

## 2023-12-11 NOTE — ED Triage Notes (Signed)
 Lower abdominal pain and tingling with urination onset 1 week ago. No NVD. No new foods or meds.

## 2023-12-12 LAB — URINE CULTURE
Culture: <10000 — AB
Special Requests: NORMAL

## 2023-12-13 ENCOUNTER — Ambulatory Visit (HOSPITAL_COMMUNITY): Payer: Self-pay

## 2023-12-13 LAB — CYTOLOGY, (ORAL, ANAL, URETHRAL) ANCILLARY ONLY
Chlamydia: NEGATIVE
Comment: NEGATIVE
Comment: NEGATIVE
Comment: NORMAL
Neisseria Gonorrhea: NEGATIVE
Trichomonas: NEGATIVE

## 2023-12-22 ENCOUNTER — Ambulatory Visit: Admitting: Internal Medicine

## 2023-12-22 ENCOUNTER — Other Ambulatory Visit: Payer: Self-pay

## 2023-12-22 ENCOUNTER — Telehealth: Payer: Self-pay

## 2023-12-22 ENCOUNTER — Encounter: Payer: Self-pay | Admitting: Internal Medicine

## 2023-12-22 ENCOUNTER — Ambulatory Visit (INDEPENDENT_AMBULATORY_CARE_PROVIDER_SITE_OTHER): Admitting: Internal Medicine

## 2023-12-22 ENCOUNTER — Other Ambulatory Visit (HOSPITAL_COMMUNITY)
Admission: RE | Admit: 2023-12-22 | Discharge: 2023-12-22 | Disposition: A | Discharge status: Home / Self Care | Source: Ambulatory Visit | Attending: Internal Medicine | Admitting: Internal Medicine

## 2023-12-22 VITALS — BP 115/75 | HR 65 | Temp 97.7°F | Ht 73.0 in | Wt 205.0 lb

## 2023-12-22 DIAGNOSIS — R85619 Unspecified abnormal cytological findings in specimens from anus: Secondary | ICD-10-CM

## 2023-12-22 DIAGNOSIS — N419 Inflammatory disease of prostate, unspecified: Secondary | ICD-10-CM

## 2023-12-22 DIAGNOSIS — Z23 Encounter for immunization: Secondary | ICD-10-CM | POA: Diagnosis not present

## 2023-12-22 DIAGNOSIS — B20 Human immunodeficiency virus [HIV] disease: Secondary | ICD-10-CM

## 2023-12-22 DIAGNOSIS — Z113 Encounter for screening for infections with a predominantly sexual mode of transmission: Secondary | ICD-10-CM | POA: Diagnosis not present

## 2023-12-22 DIAGNOSIS — Z79899 Other long term (current) drug therapy: Secondary | ICD-10-CM

## 2023-12-22 DIAGNOSIS — Z0000 Encounter for general adult medical examination without abnormal findings: Secondary | ICD-10-CM

## 2023-12-22 MED ORDER — DOXYCYCLINE HYCLATE 100 MG PO TABS: 200.0000 mg | ORAL_TABLET | Freq: Once | ORAL | 0 refills | Status: DC | PRN

## 2023-12-22 NOTE — Telephone Encounter (Signed)
 Patient called wanting to know how to get Rx for doxyPEP. Notified him it was sent to [facility] on [address].   Kayode Petion, BSN, RN

## 2023-12-22 NOTE — Progress Notes (Signed)
 RFV: follow up on prostatitis and hiv disease  Patient ID: Frederick Hess, adult   DOB: 09/29/1985, 38 y.o.   MRN: [id number]  HPI  Frederick Hess is 38yo M with well controlled hiv disease, CD 4 count of 360/VL<20, on biktarvy . No missing doses  Resolutions of symptoms associated with prostatitis; he finished his course of ertapenem   Has had unprotected sex.  At his baseline health. No other issues of concern for his health.   Outpatient Encounter Medications as of 12/22/2023  Medication Sig   bictegravir-emtricitabine-tenofovir AF (BIKTARVY ) 50-200-25 MG TABS tablet Take 1 tablet by mouth daily.   ibuprofen  (ADVIL ) 800 MG tablet Take 1 tablet (800 mg total) by mouth 3 (three) times daily.   Azelastine  HCl 137 MCG/SPRAY SOLN Place 1 spray into the nose 2 (two) times daily as needed. (Patient not taking: Reported on 10/05/2023)   benzonatate  (TESSALON ) 200 MG capsule Take 1 capsule (200 mg total) by mouth 3 (three) times daily as needed for cough. (Patient not taking: Reported on 10/05/2023)   doxycycline  (VIBRAMYCIN ) 100 MG capsule Take 1 capsule (100 mg total) by mouth 2 (two) times daily. (Patient not taking: Reported on 10/05/2023)   nitrofurantoin , macrocrystal-monohydrate, (MACROBID ) 100 MG capsule Take 1 capsule (100 mg total) by mouth 2 (two) times daily. (Patient not taking: Reported on 12/22/2023)   ondansetron  (ZOFRAN -ODT) 4 MG disintegrating tablet Take 1 tablet (4 mg total) by mouth every 8 (eight) hours as needed for nausea or vomiting. (Patient not taking: Reported on 10/05/2023)   phenazopyridine (PYRIDIUM) 200 MG tablet Take 1 tablet (200 mg total) by mouth 3 (three) times daily. (Patient not taking: Reported on 12/22/2023)   No facility-administered encounter medications on file as of 12/22/2023.     Patient Active Problem List   Diagnosis Date Noted   Prostatitis 11/08/2023   PICC (peripherally inserted central catheter) in place 11/08/2023   PICC (peripherally inserted  central catheter) removal 11/08/2023   Dysuria 02/25/2023   Hematospermia 02/25/2023   Hematuria 02/25/2023   Rash 12/29/2022   Health care maintenance 09/27/2022   Night sweats 09/27/2022   HIV disease (HCC) 09/24/2022   Medication management 09/24/2022   Screening for STDs (sexually transmitted diseases) 09/24/2022   Need for hepatitis C screening test 09/24/2022   Need for hepatitis B screening test 09/24/2022   Depression 10/30/2013   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  HENT: Negative for congestion, sore throat, rhinorrhea, sneezing, trouble swallowing and sinus pressure.  Eyes: Negative for photophobia and visual disturbance.  Respiratory: Negative for cough, chest tightness, shortness of breath, wheezing and stridor.  Cardiovascular: Negative for chest pain, palpitations and leg swelling.  Gastrointestinal: Negative for nausea, vomiting, abdominal pain, diarrhea, constipation, blood in stool, abdominal distention and anal bleeding.  Genitourinary: Negative for dysuria, hematuria, flank pain and difficulty urinating.  Musculoskeletal: Negative for myalgias, back pain, joint swelling, arthralgias and gait problem.  Skin: Negative for color change, pallor, rash and wound.  Neurological: Negative for dizziness, tremors, weakness and light-headedness.  Hematological: Negative for adenopathy. Does not  bruise/bleed easily.  Psychiatric/Behavioral: Negative for behavioral problems, confusion, sleep disturbance, dysphoric mood, decreased  concentration and agitation.   Physical Exam   BP 115/75   Pulse 65   Temp 97.7 F (36.5 C) (Oral)   Ht 6' 1 (1.854 m)   Wt 205 lb (93 kg)   SpO2 97%   BMI 27.05 kg/m   Physical Exam  Constitutional: He is oriented to person, place, and time. He appears well-developed and well-nourished. No distress.  HENT:  Mouth/Throat: Oropharynx is clear and moist. No oropharyngeal exudate.  Cardiovascular: Normal rate, regular rhythm and normal heart sounds. Exam reveals no gallop and no friction rub.  No murmur heard.  Pulmonary/Chest: Effort normal and breath sounds normal. No respiratory distress. He has no wheezes.  Abdominal: Soft. Bowel sounds are normal. He exhibits no distension. There is no tenderness.  Lymphadenopathy:  He has no cervical adenopathy.  Neurological: He is alert and oriented to person, place, and time.  Skin: Skin is warm and dry. No rash noted. No erythema.  Psychiatric: He has a normal mood and affect. His behavior is normal.   Lab Results  Component Value Date   CD4TCELL 28 (L) 11/04/2023   Lab Results  Component Value Date   CD4TABS 306 (L) 11/04/2023   CD4TABS 290 (L) 05/11/2023   CD4TABS 351 (L) 02/25/2023   Lab Results  Component Value Date   HIV1RNAQUANT 50 (H) 11/04/2023   Lab Results  Component Value Date   HEPBSAB REACTIVE (A) 09/24/2022   Lab Results  Component Value Date   LABRPR NON-REACTIVE 11/04/2023    CBC Lab Results  Component Value Date   WBC 4.3 09/13/2023   RBC 4.34 09/13/2023   HGB 13.4 09/13/2023   HCT 39.5 09/13/2023   PLT 137 (L) 09/13/2023   MCV 91.0 09/13/2023   MCH 30.9 09/13/2023   MCHC 33.9 09/13/2023   RDW 15.0 09/13/2023   LYMPHSABS 1.6 08/31/2023   MONOABS 0.4 08/31/2023   EOSABS 0.1 08/31/2023    BMET Lab Results  Component Value Date   NA 140 11/04/2023   K 4.2 11/04/2023   CL 104 11/04/2023   CO2 28 11/04/2023   GLUCOSE 89 11/04/2023   BUN 12 11/04/2023   CREATININE 1.38 (H) 11/04/2023    CALCIUM 9.5 11/04/2023   GFRNONAA >60 08/31/2023   GFRAA >60 01/08/2019      Assessment and Plan   HIV disease= continue on biktarvy   Prostatitis = treated and now resolved.  Unprotected sex = will do Sti testing, as well as provide Doxy pep  Health maintenance = will give HPV #1 today; then, we will give 2nd dose at next visit Deferred flu and covid vaccine.  Ascus on anal pap (June 2025) - has referral for hra

## 2023-12-23 LAB — CYTOLOGY, (ORAL, ANAL, URETHRAL) ANCILLARY ONLY
Chlamydia: NEGATIVE
Chlamydia: NEGATIVE
Comment: NEGATIVE
Comment: NEGATIVE
Comment: NORMAL
Comment: NORMAL
Neisseria Gonorrhea: NEGATIVE
Neisseria Gonorrhea: NEGATIVE

## 2023-12-23 LAB — URINE CYTOLOGY ANCILLARY ONLY
Chlamydia: NEGATIVE
Comment: NEGATIVE
Comment: NORMAL
Neisseria Gonorrhea: NEGATIVE

## 2023-12-23 LAB — RPR: RPR Ser Ql: NONREACTIVE

## 2024-01-24 ENCOUNTER — Telehealth: Payer: Self-pay

## 2024-01-24 NOTE — Telephone Encounter (Signed)
 Received fax from [facility]. They need medical records and a letter of medical necessity supporting the need for genosure that was ordered 10/05/23.  Montray Kliebert, BSN, RN

## 2024-01-25 NOTE — Telephone Encounter (Signed)
 Letter, medical records, and medical necessity form completed and faxed back to [facility] billing.   Illana Nolting, BSN, RN

## 2024-01-25 NOTE — Telephone Encounter (Signed)
 I am here this afternoon if u want to wait  Otherwise please say  Treatment experienced patient, needing archive genotype to make sure oknto start cabenuva  Thanks

## 2024-03-02 ENCOUNTER — Other Ambulatory Visit (HOSPITAL_COMMUNITY)
Admission: RE | Admit: 2024-03-02 | Discharge: 2024-03-02 | Disposition: A | Payer: Self-pay | Source: Ambulatory Visit | Attending: Infectious Diseases | Admitting: Infectious Diseases

## 2024-03-02 ENCOUNTER — Encounter: Payer: Self-pay | Admitting: Infectious Diseases

## 2024-03-02 ENCOUNTER — Ambulatory Visit: Payer: Self-pay | Admitting: Infectious Diseases

## 2024-03-02 ENCOUNTER — Other Ambulatory Visit: Payer: Self-pay

## 2024-03-02 VITALS — BP 108/68 | HR 70 | Temp 97.7°F | Ht 73.0 in | Wt 203.0 lb

## 2024-03-02 DIAGNOSIS — Z113 Encounter for screening for infections with a predominantly sexual mode of transmission: Secondary | ICD-10-CM | POA: Insufficient documentation

## 2024-03-02 DIAGNOSIS — Z7185 Encounter for immunization safety counseling: Secondary | ICD-10-CM

## 2024-03-02 DIAGNOSIS — Z79899 Other long term (current) drug therapy: Secondary | ICD-10-CM

## 2024-03-02 DIAGNOSIS — B20 Human immunodeficiency virus [HIV] disease: Secondary | ICD-10-CM

## 2024-03-02 DIAGNOSIS — Z5181 Encounter for therapeutic drug level monitoring: Secondary | ICD-10-CM

## 2024-03-02 MED ORDER — DOXYCYCLINE HYCLATE 100 MG PO TABS: 200.0000 mg | ORAL_TABLET | Freq: Once | ORAL | 0 refills | Status: DC | PRN

## 2024-03-02 NOTE — Progress Notes (Unsigned)
 [address], [place], [place], [address]                                                                  Phn. [phone]; Fax: [phone]                                                                             Date: 11/04/23  Reason for Visit: fu on chronic prostatitis/HIV   HPI: Frederick Hess is a 37 y.o.old adult with a history of prior Trans gender M to F, back to Male since 2023, Chamydia s/p tx, HIV who is here for follow up in the setting of being treated for chronic prostatitis.   Interval hx/current visit: Completed IV antibiotics approx 2 weeks ago, 3 weeks of IV ertapenem  total. PICC removed. Prior GU symptoms have significantly improved except some occasional pain on and off in the suprapibic area and rectal area. Denies perineal pain, dysuria, increased frequency of urination, penile discharge, ulcers, rectal pain.  He was seen in the UC 8/1 for rectal pain and was recommended to go to ED but does not seem to have gone.   Compliant with Biktarvy  with no missed doses or concerns. Wants to be screened for STDs. Sexually active with male partner. Denies  smoking, alcohol and recreational drugs.   No other complaints.   12/18 Just woke up and sleepy, compliant, sexually active and wants to be screened. Declined vaccines flu and HPV. Wants doxy pep.    ROS: As stated in above HPI; all other systems were reviewed and are otherwise negative unless noted below  No reported fever / chills, night sweats, unintentional weight loss, acute visual change, odynophagia, chest pain/pressure, new or worsened SOB or WOB, nausea, vomiting, diarrhea, dysuria, GU discharge, syncope, seizures, red/hot swollen joints, hallucinations / delusions, rashes, new allergies, unusual / excessive  bleeding, swollen lymph nodes, or new hospitalizations since the pt was last seen.  PMH/ PSH/ FamHx / Social Hx , medications and allergies reviewed and updated as appropriate; please see corresponding tab in EHR / prior notes                                        Current Outpatient Medications on File Prior to Visit  Medication Sig Dispense Refill   bictegravir-emtricitabine-tenofovir AF (BIKTARVY ) 50-200-25 MG TABS tablet Take 1 tablet by mouth daily. 30 tablet 11   doxycycline  (VIBRA -TABS) 100 MG tablet Take 2 tablets (200 mg total) by mouth once as needed for up to 1 dose. Within  24-72hs of unprotected sex. Take on full stomach 20 tablet 0   ibuprofen  (ADVIL ) 800 MG tablet Take 1 tablet (800 mg total) by mouth 3 (three) times daily. (Patient not taking: Reported on 03/02/2024) 21 tablet 0   ondansetron  (ZOFRAN -ODT) 4 MG disintegrating tablet Take 1 tablet (4 mg total) by mouth every 8 (eight) hours as needed for nausea or vomiting. (Patient not taking: Reported on 03/02/2024) 20 tablet 0   phenazopyridine  (PYRIDIUM ) 200 MG tablet Take 1 tablet (200 mg total) by mouth 3 (three) times daily. (Patient not taking: Reported on 03/02/2024) 6 tablet 0   No current facility-administered medications on file prior to visit.   Allergies  Allergen Reactions   Amoxicillin  Anaphylaxis and Hives    Has patient had a PCN reaction causing immediate rash, facial/tongue/throat swelling, SOB or lightheadedness with hypotension: Yes Has patient had a PCN reaction causing severe rash involving mucus membranes or skin necrosis: Yes Has patient had a PCN reaction that required hospitalization: ED visit Has patient had a PCN reaction occurring within the last 10 years: Yes, in 2015 If all of the above answers are NO, then may proceed with Cephalosporin use.    Past Medical History:  Diagnosis Date   Hormone replacement therapy    Migraine    Transexualism    Past Surgical History:  Procedure Laterality  Date   COSMETIC SURGERY     rhinoplasty   RHINOPLASTY    ' Social History   Socioeconomic History   Marital status: Single    Spouse name: Not on file   Number of children: Not on file   Years of education: Not on file   Highest education level: Not on file  Occupational History   Not on file  Tobacco Use   Smoking status: Former    Current packs/day: 1.00    Types: Cigarettes   Smokeless tobacco: Never  Vaping Use   Vaping status: Never Used  Substance and Sexual Activity   Alcohol use: Never   Drug use: Never   Sexual activity: Not Currently    Comment: condoms given  Other Topics Concern   Not on file  Social History Narrative   Not on file   Social Drivers of Health   Tobacco Use: Medium Risk (03/02/2024)   Patient History    Smoking Tobacco Use: Former    Smokeless Tobacco Use: Never    Passive Exposure: Not on file  Financial Resource Strain: Not on file  Food Insecurity: Not on file  Transportation Needs: Not on file  Physical Activity: Not on file  Stress: Not on file (01/21/2023)  Social Connections: Not on file  Intimate Partner Violence: Not on file  Depression (PHQ2-9): Low Risk (03/02/2024)   Depression (PHQ2-9)    PHQ-2 Score: 0  Alcohol Screen: Not on file  Housing: Not on file  Utilities: Not on file  Health Literacy: Not on file   Family History  Problem Relation Age of Onset   Diabetes Mother    Vitals  BP 108/68   Pulse 70   Temp 97.7 F (36.5 C) (Temporal)   Ht 6' 1 (1.854 m)   Wt 203 lb (92.1 kg)   SpO2 96%   BMI 26.78 kg/m   Examination  Gen: no acute distress HEENT: Fuig/AT, no scleral icterus, no pale conjunctivae, hearing normal, oral mucosa moist Neck: Supple Cardio: Regular rate and rhythm Resp: Pulmonary effort normal in room air GI: nondistended GU: deferred Musc: Extremities: No pedal edema  Skin: No rashes Neuro: grossly non focal , awake, alert and oriented * 3  Psych: Calm, cooperative  Lab Results HIV 1  RNA Quant  Date Value  11/04/2023 50 copies/mL (H)  05/11/2023 <20 Copies/mL (H)  02/25/2023 74 copies/mL (H)   CD4 T Cell Abs (/uL)  Date Value  11/04/2023 306 (L)  05/11/2023 290 (L)  02/25/2023 351 (L)   No results found for: HIV1GENOSEQ Lab Results  Component Value Date   WBC 4.3 09/13/2023   HGB 13.4 09/13/2023   HCT 39.5 09/13/2023   MCV 91.0 09/13/2023   PLT 137 (L) 09/13/2023    Lab Results  Component Value Date   CREATININE 1.38 (H) 11/04/2023   BUN 12 11/04/2023   NA 140 11/04/2023   K 4.2 11/04/2023   CL 104 11/04/2023   CO2 28 11/04/2023   Lab Results  Component Value Date   ALT 22 11/04/2023   AST 18 11/04/2023   ALKPHOS 29 (L) 08/31/2023   BILITOT 1.5 (H) 11/04/2023    No results found for: CHOL, TRIG, HDL, LDLCALC Lab Results  Component Value Date   HAV NON-REACTIVE 09/24/2022   Lab Results  Component Value Date   HEPBSAG NON-REACTIVE 09/24/2022   HEPBSAB REACTIVE (A) 09/24/2022   No results found for: HCVAB Lab Results  Component Value Date   CHLAMYDIAWP Negative 12/22/2023   CHLAMYDIAWP Negative 12/22/2023   CHLAMYDIAWP Negative 12/22/2023   N Negative 12/22/2023   N Negative 12/22/2023   N Negative 12/22/2023   No results found for: GCPROBEAPT No results found for: QUANTGOLD    Health Maintenance: Immunization History  Administered Date(s) Administered   DTaP 04/21/1984, 06/23/1984, 09/20/1984, 09/18/1985   HPV 9-valent 12/22/2023   Hepatitis B, PED/ADOLESCENT 12/25/1996, 03/28/1997, 06/28/1997   Influenza-Unspecified 02/22/2008   MMR 05/19/1985   OPV 04/21/1984, 06/23/1984, 09/20/1984   Tdap 09/11/2022   Assessment/Plan: # Chronic prostatitis  - s/p completion of 3 weeks of IV ertapenem   - significant improvement in prior symptoms - fu in 6 weeks   # HIV  - continue PO biktarvy  - labs today  - fu as above, potential plan for cabenuva noted   # STD Screening  - GC triple screen and RPR  #  Immunization  # Health maintenance - to be discussed in subsequent visits with primary HIV provider  Patient's labs were reviewed as well as his previous records. Patients questions were addressed and answered. Safe sex counseling done.  I spent 35  minutes involved in face-to-face and non-face-to-face activities for this patient on the day of the visit. Professional time spent includes the following activities: Preparing to see the patient (review of tests), Obtaining and reviewing separately obtained history (prior notes from Dr Overton), Performing a medically appropriate examination and evaluation , Ordering labs, referring and communicating with other health care professionals, Documenting clinical information in the EMR, Independently interpreting results (not separately reported), Communicating results to the patient, Counseling and educating the patient and Care coordination (not separately reported).   Of note, portions of this note may have been created with voice recognition software. While this note has been edited for accuracy, occasional wrong-word or sound-a-like substitutions may have occurred due to the inherent limitations of voice recognition software.   Electronically signed by:  Annalee Orem, MD Infectious Disease Physician [facility] [address] [place], [place] [address] Phone: [phone]  Fax: [phone]

## 2024-03-03 DIAGNOSIS — Z79899 Other long term (current) drug therapy: Secondary | ICD-10-CM | POA: Insufficient documentation

## 2024-03-03 DIAGNOSIS — Z5181 Encounter for therapeutic drug level monitoring: Secondary | ICD-10-CM | POA: Insufficient documentation

## 2024-03-03 DIAGNOSIS — Z7185 Encounter for immunization safety counseling: Secondary | ICD-10-CM | POA: Insufficient documentation

## 2024-03-03 LAB — T-HELPER CELLS (CD4) COUNT (NOT AT ARMC)
CD4 % Helper T Cell: 30 % — ABNORMAL LOW (ref 33–65)
CD4 T Cell Abs: 457 /uL (ref 400–1790)

## 2024-03-03 MED ORDER — DOXYCYCLINE HYCLATE 100 MG PO TABS: 200.0000 mg | ORAL_TABLET | Freq: Once | ORAL | 0 refills | Status: AC | PRN

## 2024-03-03 NOTE — Addendum Note (Signed)
 Addended by: DEA SHINER on: 03/03/2024 10:11 AM   Modules accepted: Orders

## 2024-03-06 LAB — COMPREHENSIVE METABOLIC PANEL WITH GFR
AG Ratio: 1.6 (calc) (ref 1.0–2.5)
ALT: 12 U/L (ref 9–46)
AST: 16 U/L (ref 10–40)
Albumin: 4.4 g/dL (ref 3.6–5.1)
Alkaline phosphatase (APISO): 29 U/L — ABNORMAL LOW (ref 36–130)
BUN/Creatinine Ratio: 9 (calc) (ref 6–22)
BUN: 13 mg/dL (ref 7–25)
CO2: 29 mmol/L (ref 20–32)
Calcium: 9 mg/dL (ref 8.6–10.3)
Chloride: 105 mmol/L (ref 98–110)
Creat: 1.39 mg/dL — ABNORMAL HIGH (ref 0.60–1.29)
Globulin: 2.8 g/dL (ref 1.9–3.7)
Glucose, Bld: 98 mg/dL (ref 65–99)
Potassium: 4 mmol/L (ref 3.5–5.3)
Sodium: 141 mmol/L (ref 135–146)
Total Bilirubin: 0.7 mg/dL (ref 0.2–1.2)
Total Protein: 7.2 g/dL (ref 6.1–8.1)
eGFR: 66 mL/min/1.73m2

## 2024-03-06 LAB — CYTOLOGY, (ORAL, ANAL, URETHRAL) ANCILLARY ONLY
Chlamydia: NEGATIVE
Chlamydia: NEGATIVE
Comment: NEGATIVE
Comment: NEGATIVE
Comment: NORMAL
Comment: NORMAL
Neisseria Gonorrhea: NEGATIVE
Neisseria Gonorrhea: NEGATIVE

## 2024-03-06 LAB — URINE CYTOLOGY ANCILLARY ONLY
Chlamydia: NEGATIVE
Comment: NEGATIVE
Comment: NORMAL
Neisseria Gonorrhea: NEGATIVE

## 2024-03-06 LAB — HIV RNA, RTPCR W/R GT (RTI, PI,INT)
HIV 1 RNA Quant: 29 {copies}/mL — ABNORMAL HIGH
HIV-1 RNA Quant, Log: 1.46 {Log_copies}/mL — ABNORMAL HIGH

## 2024-03-06 LAB — SYPHILIS: RPR W/REFLEX TO RPR TITER AND TREPONEMAL ANTIBODIES, TRADITIONAL SCREENING AND DIAGNOSIS ALGORITHM: RPR Ser Ql: NONREACTIVE

## 2024-03-06 NOTE — Progress Notes (Signed)
 The ASCVD Risk score (Arnett DK, et al., 2019) failed to calculate for the following reasons:   Cannot find a previous HDL lab   Cannot find a previous total cholesterol lab   * - Cholesterol units were assumed  Duwaine Lowe, BSN, [facility]

## 2024-03-10 ENCOUNTER — Ambulatory Visit: Payer: Self-pay | Admitting: Infectious Diseases

## 2024-03-15 ENCOUNTER — Other Ambulatory Visit: Payer: Self-pay | Admitting: Pharmacist

## 2024-03-15 DIAGNOSIS — B20 Human immunodeficiency virus [HIV] disease: Secondary | ICD-10-CM

## 2024-03-15 MED ORDER — BIKTARVY 50-200-25 MG PO TABS: 1.0000 | ORAL_TABLET | Freq: Every day | ORAL | Status: AC

## 2024-03-15 NOTE — Progress Notes (Signed)
 Medication Samples have been provided to the patient.  Drug name: Biktarvy         Strength: 50/200/25 mg       Qty: 1 bottle (7 tablets)   LOT: [id number]   Exp.Date: 04/15/26  Samples requested by Dr. Dea .  Dosing instructions: Take one tablet by mouth once daily  The patient has been instructed regarding the correct time, dose, and frequency of taking this medication, including desired effects and most common side effects.   Frederick Hess L. Laconya Clere, PharmD, BCIDP, AAHIVP, CPP Clinical Pharmacist Practitioner Infectious Diseases Clinical Pharmacist [facility]

## 2024-03-22 ENCOUNTER — Telehealth: Payer: Self-pay

## 2024-03-22 NOTE — Telephone Encounter (Signed)
 Patient called requesting to speak with Financial counselor. Advised she is gone for the day but I will send a message for her to return his call tomorrow.   Jlynn Ly SHAUNNA Letters, CMA

## 2024-03-22 NOTE — Telephone Encounter (Signed)
 Patient called requesting Biktarvy  refill and assistance with Medicaid. Advised him he has refills on file at [facility] on [address]. Call transferred to financial counselor to assist with coverage.   Jermar Colter, BSN, RN

## 2024-04-01 ENCOUNTER — Encounter (HOSPITAL_COMMUNITY): Payer: Self-pay | Admitting: *Deleted

## 2024-04-01 ENCOUNTER — Ambulatory Visit (HOSPITAL_COMMUNITY)
Admission: EM | Admit: 2024-04-01 | Discharge: 2024-04-01 | Disposition: A | Discharge status: Home / Self Care | Payer: Self-pay | Attending: Emergency Medicine | Admitting: Emergency Medicine

## 2024-04-01 DIAGNOSIS — N419 Inflammatory disease of prostate, unspecified: Secondary | ICD-10-CM | POA: Insufficient documentation

## 2024-04-01 DIAGNOSIS — R1030 Lower abdominal pain, unspecified: Secondary | ICD-10-CM | POA: Insufficient documentation

## 2024-04-01 HISTORY — DX: Asymptomatic human immunodeficiency virus (hiv) infection status: Z21

## 2024-04-01 LAB — POCT URINE DIPSTICK
Blood, UA: NEGATIVE
Glucose, UA: NEGATIVE mg/dL
Ketones, POC UA: NEGATIVE mg/dL
Leukocytes, UA: NEGATIVE
Nitrite, UA: NEGATIVE
Protein Ur, POC: NEGATIVE mg/dL
Spec Grav, UA: 1.015
Urobilinogen, UA: 1 U/dL
pH, UA: 6.5

## 2024-04-01 MED ORDER — LEVOFLOXACIN 500 MG PO TABS: 500.0000 mg | ORAL_TABLET | Freq: Every day | ORAL | 0 refills | Status: AC

## 2024-04-01 NOTE — Discharge Instructions (Addendum)
 I am concerned he may have prostatitis again.  Take the Levaquin  daily with food for the next 14 days.  Ensure you are drinking at least 64 ounces of water  daily to help flush your kidneys.  Follow-up with your primary care provider in the next 1 to 2 weeks for recheck.  If there is still clinical concern for prostatitis they may consider ordering advanced imaging outpatient.  For any fever or new concerning symptoms seek immediate care at the nearest emergency department.

## 2024-04-01 NOTE — ED Triage Notes (Signed)
 Pt states lower back and lower abdominal pain X 1 week. He is having to get up 7-8 times during the night to urinate.

## 2024-04-01 NOTE — ED Provider Notes (Signed)
 " [facility]    CSN: [id number] Arrival date & time: 04/01/24  0801      History   Chief Complaint Chief Complaint  Patient presents with   Back Pain   Abdominal Pain    HPI Frederick Hess is a 39 y.o. adult.   Patient presents to clinic over concern of flank pain, lower abdominal pain and urinary frequency with urgency for the past week or so.  He has been getting up 4-5 times a night to urinate and noticed that his urine is very yellow and malodorous.  Has not had hematuria.  Denies any nausea or vomiting.  Denies fevers.  Denies concern for sexually transmitted infection, he is sexually active and wears condoms.  Denies penile discharge.  Hx of HIV, well-controlled with medication.  Compliance with Biktarvy .  Hx of chronic prostatitis seen on imaging in July of 2025, noted to have finished IV antibiotics through PICC at ID visit on 12/22/23.   The history is provided by the patient and medical records.  Back Pain Abdominal Pain   Past Medical History:  Diagnosis Date   HIV (human immunodeficiency virus infection) (HCC)    Hormone replacement therapy    Migraine    Transexualism     Patient Active Problem List   Diagnosis Date Noted   Medication monitoring encounter 03/03/2024   Encounter for long-term current use of medication 03/03/2024   Immunization counseling 03/03/2024   Prostatitis 11/08/2023   PICC (peripherally inserted central catheter) in place 11/08/2023   PICC (peripherally inserted central catheter) removal 11/08/2023   Dysuria 02/25/2023   Hematospermia 02/25/2023   Hematuria 02/25/2023   Rash 12/29/2022   Health care maintenance 09/27/2022   Night sweats 09/27/2022   HIV disease (HCC) 09/24/2022   Medication management 09/24/2022   Screening for STDs (sexually transmitted diseases) 09/24/2022   Need for hepatitis C screening test 09/24/2022   Need for hepatitis B screening test 09/24/2022   Depression 10/30/2013   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      Home Medications    Prior to Admission medications  Medication Sig Start Date End Date Taking? Authorizing Provider  bictegravir-emtricitabine-tenofovir AF (BIKTARVY ) 50-200-25 MG TABS tablet Take 1 tablet by mouth daily. 09/13/23  Yes Vu, Constance DASEN, MD  levofloxacin  (LEVAQUIN ) 500 MG tablet Take 1 tablet (500 mg total) by mouth daily for 14 days. 04/01/24 04/15/24 Yes Ball, Katheryn Culliton  G, FNP  doxycycline  (VIBRA -TABS) 100 MG tablet Take 2 tablets (200 mg total) by mouth once as needed for up to 20 doses. Within 24-72hs of unprotected sex. Take on full stomach. No more than 200mg  in a day. 03/03/24   Dea Shiner, MD    Family History Family History  Problem Relation Age of Onset   Diabetes Mother     Social History Social History[1]   Allergies   Amoxicillin    Review of Systems Review of Systems  Per HPI  Physical Exam Triage Vital Signs ED Triage Vitals  Encounter Vitals Group     BP 04/01/24 0815 130/81     Girls Systolic BP Percentile --      Girls Diastolic BP Percentile --  Boys Systolic BP Percentile --      Boys Diastolic BP Percentile --      Pulse Rate 04/01/24 0815 72     Resp 04/01/24 0815 16     Temp 04/01/24 0815 (!) 97.3 F (36.3 C)     Temp Source 04/01/24 0815 Oral     SpO2 04/01/24 0815 96 %     Weight --      Height --      Head Circumference --      Peak Flow --      Pain Score 04/01/24 0814 7     Pain Loc --      Pain Education --      Exclude from Growth Chart --    No data found.  Updated Vital Signs BP 130/81 (BP Location: Right Arm)   Pulse 72   Temp (!) 97.3 F (36.3 C) (Oral)   Resp 16   SpO2 96%   Visual  Acuity Right Eye Distance:   Left Eye Distance:   Bilateral Distance:    Right Eye Near:   Left Eye Near:    Bilateral Near:     Physical Exam Vitals and nursing note reviewed.  Constitutional:      Appearance: Normal appearance.  HENT:     Head: Normocephalic and atraumatic.     Right Ear: External ear normal.     Left Ear: External ear normal.     Nose: Nose normal.     Mouth/Throat:     Mouth: Mucous membranes are moist.  Eyes:     Conjunctiva/sclera: Conjunctivae normal.  Cardiovascular:     Rate and Rhythm: Normal rate.  Pulmonary:     Effort: Pulmonary effort is normal. No respiratory distress.  Abdominal:     General: Abdomen is flat. Bowel sounds are normal.     Palpations: Abdomen is soft.     Tenderness: There is abdominal tenderness. There is no guarding or rebound.  Skin:    General: Skin is warm and dry.  Neurological:     General: No focal deficit present.     Mental Status: He is alert.  Psychiatric:        Mood and Affect: Mood normal.      UC Treatments / Results  Labs (all labs ordered are listed, but only abnormal results are displayed) Labs Reviewed  POCT URINE DIPSTICK - Abnormal; Notable for the following components:      Result Value   Bilirubin, UA small (*)    All other components within normal limits  URINE CULTURE    EKG   Radiology No results found.  Procedures Procedures (including critical care time)  Medications Ordered in UC Medications - No data to display  Initial Impression / Assessment and Plan / UC Course  I have reviewed the triage vital signs and the nursing notes.  Pertinent labs & imaging results that were available during my care of the patient were reviewed by me and considered in my medical decision making (see chart for details).  Vitals and triage reviewed, patient is hemodynamically stable.  Abdomen is soft with active bowel sounds.  Mild suprapubic tenderness, without rebound or guarding.  History  of chronic prostatitis requiring IV antibiotics through PICC line.  Afebrile and w/o tachycardia, low concern for systemic infection. UA unremarkable in clinic, will send for culture.  Discussed limitations of advanced imaging through urgent care setting.  Concerned that prostatitis has returned, will treat with Levaquin  daily for  2 weeks and encouraged outpatient follow-up for advanced images and as needed.  Plan of care, follow-up care, and return precautions given, no questions at this time. Work note provided.      Final Clinical Impressions(s) / UC Diagnoses   Final diagnoses:  Lower abdominal pain  Prostatitis, unspecified prostatitis type     Discharge Instructions      I am concerned he may have prostatitis again.  Take the Levaquin  daily with food for the next 14 days.  Ensure you are drinking at least 64 ounces of water  daily to help flush your kidneys.  Follow-up with your primary care provider in the next 1 to 2 weeks for recheck.  If there is still clinical concern for prostatitis they may consider ordering advanced imaging outpatient.  For any fever or new concerning symptoms seek immediate care at the nearest emergency department.       ED Prescriptions     Medication Sig Dispense Auth. Provider   levofloxacin  (LEVAQUIN ) 500 MG tablet Take 1 tablet (500 mg total) by mouth daily for 14 days. 14 tablet Ball, Jakolby Sedivy  G, FNP      PDMP not reviewed this encounter.    [1]  Social History Tobacco Use   Smoking status: Former    Current packs/day: 1.00    Types: Cigarettes   Smokeless tobacco: Never  Vaping Use   Vaping status: Never Used  Substance Use Topics   Alcohol use: Never   Drug use: Never     Mercer Odessa MATSU, FNP 04/01/24 0847  "

## 2024-04-02 LAB — URINE CULTURE: Culture: NO GROWTH

## 2024-04-03 ENCOUNTER — Ambulatory Visit (HOSPITAL_COMMUNITY): Payer: Self-pay

## 2024-06-06 ENCOUNTER — Ambulatory Visit: Payer: Self-pay | Admitting: Internal Medicine
